# Patient Record
Sex: Female | Born: 1950 | Race: White | Hispanic: No | Smoking: Former smoker
Health system: Southern US, Community
[De-identification: ages and names within clinical notes are randomized; demographics above are authoritative.]

## PROBLEM LIST (undated history)

## (undated) DIAGNOSIS — T7840XA Allergy, unspecified, initial encounter: Secondary | ICD-10-CM

## (undated) DIAGNOSIS — I10 Essential (primary) hypertension: Secondary | ICD-10-CM

## (undated) DIAGNOSIS — Z8049 Family history of malignant neoplasm of other genital organs: Secondary | ICD-10-CM

## (undated) DIAGNOSIS — Z803 Family history of malignant neoplasm of breast: Secondary | ICD-10-CM

## (undated) HISTORY — DX: Allergy, unspecified, initial encounter: T78.40XA

## (undated) HISTORY — DX: Family history of malignant neoplasm of other genital organs: Z80.49

## (undated) HISTORY — DX: Gilbert syndrome: E80.4

## (undated) HISTORY — DX: Family history of malignant neoplasm of breast: Z80.3

## (undated) HISTORY — DX: Essential (primary) hypertension: I10

---
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
## 1998-08-29 ENCOUNTER — Ambulatory Visit (HOSPITAL_COMMUNITY): Admission: RE | Admit: 1998-08-29 | Discharge: 1998-08-29 | Payer: Self-pay | Admitting: Obstetrics and Gynecology

## 1998-11-30 ENCOUNTER — Other Ambulatory Visit: Admission: RE | Admit: 1998-11-30 | Discharge: 1998-11-30 | Payer: Self-pay | Admitting: Obstetrics and Gynecology

## 1999-09-18 ENCOUNTER — Encounter: Payer: Self-pay | Admitting: Obstetrics and Gynecology

## 1999-09-18 ENCOUNTER — Ambulatory Visit (HOSPITAL_COMMUNITY): Admission: RE | Admit: 1999-09-18 | Discharge: 1999-09-18 | Payer: Self-pay | Admitting: Obstetrics and Gynecology

## 2000-01-07 ENCOUNTER — Other Ambulatory Visit: Admission: RE | Admit: 2000-01-07 | Discharge: 2000-01-07 | Payer: Self-pay | Admitting: Obstetrics and Gynecology

## 2000-11-19 ENCOUNTER — Encounter: Payer: Self-pay | Admitting: Obstetrics and Gynecology

## 2000-11-19 ENCOUNTER — Ambulatory Visit (HOSPITAL_COMMUNITY): Admission: RE | Admit: 2000-11-19 | Discharge: 2000-11-19 | Payer: Self-pay | Admitting: Obstetrics and Gynecology

## 2001-01-07 ENCOUNTER — Other Ambulatory Visit: Admission: RE | Admit: 2001-01-07 | Discharge: 2001-01-07 | Payer: Self-pay | Admitting: Obstetrics and Gynecology

## 2002-07-05 ENCOUNTER — Encounter: Payer: Self-pay | Admitting: Obstetrics and Gynecology

## 2002-07-05 ENCOUNTER — Ambulatory Visit (HOSPITAL_COMMUNITY): Admission: RE | Admit: 2002-07-05 | Discharge: 2002-07-05 | Payer: Self-pay | Admitting: Obstetrics and Gynecology

## 2003-12-07 ENCOUNTER — Ambulatory Visit (HOSPITAL_COMMUNITY): Admission: RE | Admit: 2003-12-07 | Discharge: 2003-12-07 | Payer: Self-pay | Admitting: Obstetrics and Gynecology

## 2004-02-28 ENCOUNTER — Other Ambulatory Visit: Admission: RE | Admit: 2004-02-28 | Discharge: 2004-02-28 | Payer: Self-pay | Admitting: Obstetrics and Gynecology

## 2005-01-01 ENCOUNTER — Ambulatory Visit (HOSPITAL_COMMUNITY): Admission: RE | Admit: 2005-01-01 | Discharge: 2005-01-01 | Payer: Self-pay | Admitting: Obstetrics and Gynecology

## 2005-07-30 ENCOUNTER — Other Ambulatory Visit: Admission: RE | Admit: 2005-07-30 | Discharge: 2005-07-30 | Payer: Self-pay | Admitting: Obstetrics and Gynecology

## 2006-02-18 ENCOUNTER — Ambulatory Visit (HOSPITAL_COMMUNITY): Admission: RE | Admit: 2006-02-18 | Discharge: 2006-02-18 | Payer: Self-pay | Admitting: Obstetrics and Gynecology

## 2006-07-28 ENCOUNTER — Ambulatory Visit: Payer: Self-pay | Admitting: Internal Medicine

## 2006-08-04 ENCOUNTER — Ambulatory Visit: Payer: Self-pay | Admitting: Internal Medicine

## 2006-09-08 ENCOUNTER — Ambulatory Visit: Payer: Self-pay | Admitting: Internal Medicine

## 2006-09-24 ENCOUNTER — Ambulatory Visit: Payer: Self-pay | Admitting: Internal Medicine

## 2006-10-08 ENCOUNTER — Other Ambulatory Visit: Admission: RE | Admit: 2006-10-08 | Discharge: 2006-10-08 | Payer: Self-pay | Admitting: Obstetrics and Gynecology

## 2007-03-02 ENCOUNTER — Ambulatory Visit (HOSPITAL_COMMUNITY): Admission: RE | Admit: 2007-03-02 | Discharge: 2007-03-02 | Payer: Self-pay | Admitting: Obstetrics and Gynecology

## 2007-07-21 DIAGNOSIS — J309 Allergic rhinitis, unspecified: Secondary | ICD-10-CM | POA: Insufficient documentation

## 2007-07-21 DIAGNOSIS — M810 Age-related osteoporosis without current pathological fracture: Secondary | ICD-10-CM

## 2007-07-21 DIAGNOSIS — F329 Major depressive disorder, single episode, unspecified: Secondary | ICD-10-CM

## 2007-07-21 DIAGNOSIS — M818 Other osteoporosis without current pathological fracture: Secondary | ICD-10-CM | POA: Insufficient documentation

## 2008-01-09 LAB — CONVERTED CEMR LAB: Pap Smear: NORMAL

## 2008-01-17 ENCOUNTER — Other Ambulatory Visit: Admission: RE | Admit: 2008-01-17 | Discharge: 2008-01-17 | Payer: Self-pay | Admitting: Obstetrics and Gynecology

## 2008-03-20 ENCOUNTER — Ambulatory Visit (HOSPITAL_COMMUNITY): Admission: RE | Admit: 2008-03-20 | Discharge: 2008-03-20 | Payer: Self-pay | Admitting: Obstetrics and Gynecology

## 2008-09-24 LAB — HM COLONOSCOPY: HM Colonoscopy: NORMAL

## 2008-12-26 ENCOUNTER — Ambulatory Visit: Payer: Self-pay | Admitting: Internal Medicine

## 2008-12-26 LAB — CONVERTED CEMR LAB
AST: 22 units/L (ref 0–37)
Albumin: 4.5 g/dL (ref 3.5–5.2)
BUN: 15 mg/dL (ref 6–23)
Basophils Relative: 0.3 % (ref 0.0–3.0)
Bilirubin Urine: NEGATIVE
Blood in Urine, dipstick: NEGATIVE
Creatinine, Ser: 0.6 mg/dL (ref 0.4–1.2)
Direct LDL: 89.7 mg/dL
Eosinophils Absolute: 0.5 10*3/uL (ref 0.0–0.7)
Eosinophils Relative: 9.6 % — ABNORMAL HIGH (ref 0.0–5.0)
GFR calc Af Amer: 133 mL/min
GFR calc non Af Amer: 110 mL/min
Glucose, Bld: 92 mg/dL (ref 70–99)
Glucose, Urine, Semiquant: NEGATIVE
HCT: 40.3 % (ref 36.0–46.0)
Hemoglobin: 13.9 g/dL (ref 12.0–15.0)
MCV: 96.7 fL (ref 78.0–100.0)
Monocytes Absolute: 0.5 10*3/uL (ref 0.1–1.0)
Nitrite: NEGATIVE
Protein, U semiquant: NEGATIVE
RBC: 4.17 M/uL (ref 3.87–5.11)
Specific Gravity, Urine: 1.01
Total Protein: 7.3 g/dL (ref 6.0–8.3)
Urobilinogen, UA: 0.2
WBC Urine, dipstick: NEGATIVE
WBC: 5.7 10*3/uL (ref 4.5–10.5)

## 2009-01-02 ENCOUNTER — Ambulatory Visit: Payer: Self-pay | Admitting: Internal Medicine

## 2009-01-22 ENCOUNTER — Ambulatory Visit: Payer: Self-pay | Admitting: Internal Medicine

## 2009-02-06 ENCOUNTER — Ambulatory Visit: Payer: Self-pay | Admitting: Obstetrics and Gynecology

## 2009-03-21 ENCOUNTER — Other Ambulatory Visit: Admission: RE | Admit: 2009-03-21 | Discharge: 2009-03-21 | Payer: Self-pay | Admitting: Obstetrics and Gynecology

## 2009-03-21 ENCOUNTER — Encounter: Payer: Self-pay | Admitting: Obstetrics and Gynecology

## 2009-03-21 ENCOUNTER — Ambulatory Visit: Payer: Self-pay | Admitting: Obstetrics and Gynecology

## 2009-04-02 ENCOUNTER — Ambulatory Visit (HOSPITAL_COMMUNITY): Admission: RE | Admit: 2009-04-02 | Discharge: 2009-04-02 | Payer: Self-pay | Admitting: Obstetrics and Gynecology

## 2010-02-14 ENCOUNTER — Ambulatory Visit: Payer: Self-pay | Admitting: Family Medicine

## 2010-02-14 DIAGNOSIS — R42 Dizziness and giddiness: Secondary | ICD-10-CM | POA: Insufficient documentation

## 2010-03-10 LAB — CONVERTED CEMR LAB: Pap Smear: NORMAL

## 2010-03-26 ENCOUNTER — Ambulatory Visit: Payer: Self-pay | Admitting: Obstetrics and Gynecology

## 2010-03-26 ENCOUNTER — Other Ambulatory Visit: Admission: RE | Admit: 2010-03-26 | Discharge: 2010-03-26 | Payer: Self-pay | Admitting: Obstetrics and Gynecology

## 2010-04-09 ENCOUNTER — Ambulatory Visit (HOSPITAL_COMMUNITY): Admission: RE | Admit: 2010-04-09 | Discharge: 2010-04-09 | Payer: Self-pay | Admitting: Obstetrics and Gynecology

## 2010-07-18 ENCOUNTER — Ambulatory Visit: Payer: Self-pay | Admitting: Family Medicine

## 2010-12-10 NOTE — Letter (Signed)
Summary: Patient Questionnaire  Patient Questionnaire   Imported By: Beau Fanny 07/18/2010 15:05:51  _____________________________________________________________________  External Attachment:    Type:   Image     Comment:   External Document

## 2010-12-10 NOTE — Assessment & Plan Note (Signed)
Summary: NEW PT TO EST/TRANSFER FROM DR [facility]   Vital Signs:  Patient profile:   60 year old female Height:      64 inches Weight:      117.25 pounds BMI:     20.20 Temp:     98 degrees F oral Pulse rate:   64 / minute Pulse rhythm:   regular BP sitting:   136 / 92  (left arm) Cuff size:   regular  Vitals Entered By: Lewanda Rife LPN (July 18, 2010 11:09 AM) CC: New pt to establish   History of Present Illness: is here - switching practices from Dr Fabian Sharp   OP -- last dexa this [place]  some improvement in hip and spine is still OP  had gone off fosamax - for 1 year  alltogether has been on it 3 years  Dr Dianne Dun is monitoring that  is taking vitamin d every other day  women's multivit -- and gets 3-5 servings of high cal foods   past had brief episode of depression in 30 s- no longer a problem   reviewed health mt list   gets flu shots at the drug store   she is very nervous going to the doctor -- bp is up a bit  ok with the gyn         Allergies (verified): No Known Drug Allergies  Past History:  Family History: Last updated: 07/18/2010 Family History Breast cancer 1st degree relative <50 Family History of Cardiovascular disorder  age 40s  Family History Osteoporosis Father: polyarthritis nodousm,   obstruction  mass at cecum and PAN  died of pneumonia .   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   Allergic rhinitis Depression        LAST Mammogram: 03/10/2008 Pap: 01/09/2008 Td:01/09/2008 Colonscopy: 09/24/2006 EKG:07/27/06 Dexa: 01/09/2008 Eye Exam: 2008 Other: Smoking: former   Consults Dr. Collier Bullock Dr. Dianne Dun Dr. Lina Sar  Past Surgical History: Colonoscopy Tonsillectomy and adenoids 1957  Family History: Family History Breast cancer 1st degree relative <50 Family History of Cardiovascular disorder  age 41s  Family History Osteoporosis Father: polyarthritis nodousm,   obstruction  mass at cecum and PAN  died of pneumonia .   died in the past  year  Mother: breast cancer, heart attack age 80- died of that  MGF had MI and died at 38 also  father - polyarteritis nodosum - with big mass in intestines and then non healing wound , then died of pneumonia  Siblings: sister-hbp, osteopenia  Social History: Divorced Former Smoker  quit over 10 years ago  not employed currently  went to graduate school PHD critical theory  Alcohol use-yes Regular exercise-yes- running for exercise -- 4 days per week and walks the other 3 with  calesthenics     Review of Systems General:  Denies chills, fatigue, fever, loss of appetite, and malaise. Eyes:  Denies blurring and eye irritation. CV:  Denies chest pain or discomfort, lightheadness, palpitations, and shortness of breath with exertion. Resp:  Denies cough and shortness of breath. GI:  Denies abdominal pain, change in bowel habits, nausea, and vomiting. GU:  Denies dysuria and urinary frequency. MS:  Denies joint pain, joint redness, and joint swelling. Derm:  Denies itching, lesion(s), poor wound healing, and rash. Neuro:  Denies numbness, tingling, and weakness. Psych:  Denies anxiety and depression. Endo:  Denies cold intolerance, excessive thirst, excessive urination, and heat  intolerance. Heme:  Denies abnormal bruising and bleeding.  Physical Exam  General:  Well-developed,well-nourished,in no acute distress; alert,appropriate and cooperative throughout examination Head:  normocephalic, atraumatic, and no abnormalities observed.   Eyes:  vision grossly intact, pupils equal, pupils round, and pupils reactive to light.  no conjunctival pallor, injection or icterus  Ears:  R ear normal and L ear normal.   Nose:  no nasal discharge.   Mouth:  pharynx pink and moist.   Neck:  supple with full rom and no masses or thyromegally, no JVD or carotid bruit  Chest Wall:  No deformities, masses, or tenderness noted. Lungs:  Normal respiratory effort, chest expands symmetrically. Lungs are clear to auscultation, no crackles or wheezes. Heart:  Normal rate and regular rhythm. S1 and S2 normal without gallop, murmur, click, rub or other extra sounds. Abdomen:  soft, non-tender, and no distention.   Msk:  no kyphosis or acute joint changes  petite  Pulses:  R and L carotid,radial,femoral,dorsalis pedis and posterior tibial pulses are full and equal bilaterally Extremities:  No clubbing, cyanosis, edema, or deformity noted with normal full range of motion of all joints.   Neurologic:  sensation intact to light touch, gait normal, and DTRs symmetrical and normal.   Skin:  Intact without suspicious lesions or rashes Cervical Nodes:  No lymphadenopathy noted Psych:  normal affect, talkative and pleasant    Impression & Recommendations:  Problem # 1:  OSTEOPOROSIS (ICD-733.00) Assessment Unchanged monitored by gyn  no loss of ht or broken bones on 3 y of fosamax rev ca and D check D with next labs Her updated medication list for this problem includes:    Vitamin D 1000 Unit Tabs (Cholecalciferol) ..... One tablet every other day.(alternating with multivitamin)    Alendronate Sodium 70 Mg Tabs (Alendronate sodium) ..... One tablet by mouth weekly on same day. take on empty  stomach with 6-8 oz of water and stay up.  Problem # 2:  Preventive Health Care (ICD-V70.0) Assessment: Comment Only Td today  f/u feb for labs and PE  Complete Medication List: 1)  Vitamin D 1000 Unit Tabs (Cholecalciferol) .... One tablet every other day.(alternating with multivitamin) 2)  Multivitamins Tabs (Multiple vitamin) .... One tablet every other day alternating with vitamin d. 3)  Allegra-d 24 Hour 180-240 Mg Xr24h-tab (Fexofenadine-pseudoephedrine) .... Once daily as needed. 4)  Flonase 50 Mcg/act Susp (Fluticasone propionate) .... Uad 5)  Alendronate Sodium 70 Mg Tabs (Alendronate sodium) .... One tablet by mouth weekly on same day. take on empty stomach with 6-8 oz of water and stay up.  Other Orders: TD Toxoids IM 7 YR + (29528) Admin 1st Vaccine ([id number])  Patient Instructions: 1)  the current recommendation for calcium intake is 1200-1500 mg daily with 1000 IU of vitamin D  2)  schedule fasting labs after  feb 23 rd and then physical please 3)  keep up the good health habits 4)  tetnus shot today  Current Allergies (reviewed today): No known allergies    Preventive Care Screening  Bone Density:    Date:  03/10/2010    Results:  osteoporosis std dev  Mammogram:    Date:  03/10/2010    Results:  normal   Pap Smear:    Date:  03/10/2010    Results:  normal   Colonoscopy:    Date:  09/24/2008    Results:  normal    Immunizations Administered:  Tetanus Vaccine:    Vaccine Type: Td    Site: left deltoid    Mfr: Sanofi Pasteur    Dose: 0.5 ml    Route: IM    Given by: Lewanda Rife LPN    Exp. Date: 12/12/2011    Lot #: [id number]    VIS given: 09/27/08 version given July 18, 2010.

## 2010-12-10 NOTE — Assessment & Plan Note (Signed)
Summary: ?sinus inf/njr   Vital Signs:  Patient profile:   60 year old female Height:      64 inches Weight:      117 pounds BMI:     20.16 Temp:     98.3 degrees F oral BP sitting:   112 / 76  (left arm) Cuff size:   regular  Vitals Entered By: Kern Reap CMA Duncan Dull) (February 14, 2010 2:47 PM) CC: right ear pressure   CC:  right ear pressure.  History of Present Illness: Keyatta is a 60 year old of who comes in today for evaluation of two problems.  She has a history of underlying allergic rhinitis, for which she takes one third of the Allegra-D daily.  This is allergy season of course, in her allergies have flared out.  She is sneezing, running nose, watery eyes, head congestion.  No asthma.  Last night she woke up and had a bout of vertigo.  When she stood up her symptoms went away when she lies down.  She gets vertiginous.  She's had episodes of vertigo in the past, but nothing like this.  As noted above, is extremely positional.  No hearing loss, and neurologic review of systems negative  Allergies (verified): No Known Drug Allergies  Past History:  Past medical, surgical, family and social histories (including risk factors) reviewed for relevance to current acute and chronic problems.  Past Medical History: Reviewed history from 01/02/2009 and no changes required. Osteoporosis  bone density   last year.   Allergic rhinitis Depression Osteopenia       LAST Mammogram: 03/10/2008 Pap: 01/09/2008 Td:01/09/2008 Colonscopy: 09/24/2006 EKG:07/27/06 Dexa: 01/09/2008 Eye Exam: 2008 Other: Smoking: former   Consults Dr. Collier Bullock Dr. Dianne Dun Dr. Lina Sar  Past Surgical History: Reviewed history from 07/21/2007 and no changes required. Colonoscopy  Family History: Reviewed history from 01/02/2009 and no changes required. Family History Breast cancer 1st degree relative <50 Family History of Cardiovascular disorder  age 30s  Family History Osteoporosis Father:  polyarthritis nodousm,   obstruction  mass at cecum and PAN  died of pneumonia .   died in the past  year  Mother: breast cancer, heart attack age 31 Siblings: sister-hbp, osteopenia  Social History: Reviewed history from 01/02/2009 and no changes required. Divorced Former Smoker Alcohol use-yes Regular exercise-yes    Review of Systems      See HPI  Physical Exam  General:  Well-developed,well-nourished,in no acute distress; alert,appropriate and cooperative throughout examination Head:  Normocephalic and atraumatic without obvious abnormalities. No apparent alopecia or balding. Eyes:  No corneal or conjunctival inflammation noted. EOMI. Perrla. Funduscopic exam benign, without hemorrhages, exudates or papilledema. Vision grossly normal. Ears:  External ear exam shows no significant lesions or deformities.  Otoscopic examination reveals clear canals, tympanic membranes are intact bilaterally without bulging, retraction, inflammation or discharge. Hearing is grossly normal bilaterally. Nose:  External nasal examination shows no deformity or inflammation. Nasal mucosa are pink and moist without lesions or exudates. Mouth:  Oral mucosa and oropharynx without lesions or exudates.  Teeth in good repair. Neck:  No deformities, masses, or tenderness noted. Neurologic:  No cranial nerve deficits noted. Station and gait are normal. Plantar reflexes are down-going bilaterally. DTRs are symmetrical throughout. Sensory, motor and coordinative functions appear intact.   Problems:  Medical Problems Added: 1)  Dx of Vertigo  (ICD-780.4)  Impression & Recommendations:  Problem # 1:  VERTIGO (ICD-780.4) Assessment New  Problem # 2:  ALLERGIC RHINITIS (ICD-477.9) Assessment:  Deteriorated  Her updated medication list for this problem includes:    Flonase 50 Mcg/act Susp (Fluticasone propionate) ..... Uad  Complete Medication List: 1)  Vitamin D 1000 Unit Tabs (Cholecalciferol) 2)   Multivitamins Tabs (Multiple vitamin) 3)  Allegra-d 24 Hour 180-240 Mg Xr24h-tab (Fexofenadine-pseudoephedrine) .... Once daily 4)  Flonase 50 Mcg/act Susp (Fluticasone propionate) .... Uad  Patient Instructions: 1)  consider using the saline nasal irrigation at bedtime, and one shot of steroid nasal spray up each nostril at bedtime Prescriptions: FLONASE 50 MCG/ACT SUSP (FLUTICASONE PROPIONATE) UAD  #2 x 6   Entered and Authorized by:   Roderick Pee MD   Signed by:   Roderick Pee MD on 02/14/2010   Method used:   Electronically to        [facility]* (retail)       [address]       [place], [place]  [address]       Ph: [phone]       Fax: [phone]   RxID:   [id number]

## 2011-01-03 ENCOUNTER — Encounter (INDEPENDENT_AMBULATORY_CARE_PROVIDER_SITE_OTHER): Payer: Self-pay | Admitting: *Deleted

## 2011-01-03 ENCOUNTER — Other Ambulatory Visit: Payer: Self-pay | Admitting: Family Medicine

## 2011-01-03 ENCOUNTER — Encounter: Payer: Self-pay | Admitting: Family Medicine

## 2011-01-03 ENCOUNTER — Other Ambulatory Visit (INDEPENDENT_AMBULATORY_CARE_PROVIDER_SITE_OTHER): Payer: BC Managed Care – PPO

## 2011-01-03 ENCOUNTER — Telehealth (INDEPENDENT_AMBULATORY_CARE_PROVIDER_SITE_OTHER): Payer: Self-pay | Admitting: *Deleted

## 2011-01-03 DIAGNOSIS — M810 Age-related osteoporosis without current pathological fracture: Secondary | ICD-10-CM

## 2011-01-03 DIAGNOSIS — Z0000 Encounter for general adult medical examination without abnormal findings: Secondary | ICD-10-CM

## 2011-01-03 DIAGNOSIS — M899 Disorder of bone, unspecified: Secondary | ICD-10-CM

## 2011-01-03 LAB — BASIC METABOLIC PANEL
Calcium: 9.7 mg/dL (ref 8.4–10.5)
GFR: 95.61 mL/min (ref >60.00)
Glucose, Bld: 97 mg/dL (ref 70–99)
Potassium: 4.6 mEq/L (ref 3.5–5.1)
Sodium: 140 mEq/L (ref 135–145)

## 2011-01-03 LAB — CBC WITH DIFFERENTIAL/PLATELET
Basophils Relative: 0.7 % (ref 0.0–3.0)
Eosinophils Relative: 8.8 % — ABNORMAL HIGH (ref 0.0–5.0)
HCT: 39.7 % (ref 36.0–46.0)
Hemoglobin: 13.8 g/dL (ref 12.0–15.0)
Lymphocytes Relative: 34.4 % (ref 12.0–46.0)
Lymphs Abs: 2 10*3/uL (ref 0.7–4.0)
Monocytes Relative: 8.9 % (ref 3.0–12.0)
Neutro Abs: 2.8 10*3/uL (ref 1.4–7.7)
RBC: 4.15 Mil/uL (ref 3.87–5.11)
RDW: 13.3 % (ref 11.5–14.6)
WBC: 5.9 10*3/uL (ref 4.5–10.5)

## 2011-01-03 LAB — LIPID PANEL
Total CHOL/HDL Ratio: 2
VLDL: 6.8 mg/dL (ref 0.0–40.0)

## 2011-01-03 LAB — HEPATIC FUNCTION PANEL
ALT: 14 U/L (ref 0–35)
AST: 22 U/L (ref 0–37)
Albumin: 4.6 g/dL (ref 3.5–5.2)
Alkaline Phosphatase: 53 U/L (ref 39–117)
Total Protein: 6.8 g/dL (ref 6.0–8.3)

## 2011-01-03 LAB — TSH: TSH: 2.36 u[IU]/mL (ref 0.35–5.50)

## 2011-01-07 ENCOUNTER — Encounter (INDEPENDENT_AMBULATORY_CARE_PROVIDER_SITE_OTHER): Payer: BC Managed Care – PPO | Admitting: Family Medicine

## 2011-01-07 ENCOUNTER — Encounter: Payer: Self-pay | Admitting: Family Medicine

## 2011-01-07 DIAGNOSIS — Z0000 Encounter for general adult medical examination without abnormal findings: Secondary | ICD-10-CM

## 2011-01-07 DIAGNOSIS — M810 Age-related osteoporosis without current pathological fracture: Secondary | ICD-10-CM

## 2011-01-07 DIAGNOSIS — R030 Elevated blood-pressure reading, without diagnosis of hypertension: Secondary | ICD-10-CM | POA: Insufficient documentation

## 2011-01-07 NOTE — Progress Notes (Signed)
----   Converted from flag ---- ---- 01/02/2011 8:04 PM, Judith Part MD wrote: please check lipid/wellness/ vit D for v70.0 and 733.0  ---- 12/31/2010 11:54 AM, Liane Comber CMA (AAMA) wrote: Lab orders please! Good Morning! This pt is scheduled for cpx labs Friday, which labs to draw and dx codes to use? Thanks Tasha ------------------------------

## 2011-01-09 ENCOUNTER — Encounter: Payer: Self-pay | Admitting: Family Medicine

## 2011-01-16 NOTE — Assessment & Plan Note (Signed)
Summary: cpe   Vital Signs:  Patient profile:   60 year old female Height:      63.5 inches Weight:      115.75 pounds BMI:     20.26 Temp:     98 degrees F oral Pulse rate:   68 / minute Pulse rhythm:   regular BP sitting:   142 / 96  (left arm) Cuff size:   regular  Vitals Entered By: Lewanda Rife LPN (January 07, 2011 12:05 PM)  Serial Vital Signs/Assessments:  Time      Position  BP       Pulse  Resp  Temp     By                     148/90                         Judith Part MD  CC: CPX GYN does pap and breast exam   History of Present Illness: here for wellness exam and to disc chronic medical problems   is feeling good overall no complaints   wt is down 2 lb with bmi 20  142/96 bp today - this is high for her  bp is good at gyn gets more nervous here  some family hx  just got a bp cuff for home   was running for exercise  walks 3.5 miles per day and sit ups and push ups   pap and mam 5/11 with her gyn no breast lumps on self exam  dexa - OP in 5/11 too on fosamax ca and vit D D level is 37  TD 9/11-- made her really tired  other imms got a flu shot -- october   lipids good Last Lipid ProfileCholesterol: 191 (01/03/2011 8:41:06 AM)HDL:  104.70 (01/03/2011 8:41:06 AM)LDL:  80 (01/03/2011 8:41:06 AM)Triglycerides:  Last Liver profileSGOT:  22 (01/03/2011 8:41:06 AM)SPGT:  14 (01/03/2011 8:41:06 AM)T. 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   died in the past  year  Mother: breast cancer, heart attack age 80- died of that  MGF had MI and died at 1 also  father - polyarteritis nodosum - with big mass in intestines and then non healing wound , then died of pneumonia  Siblings: sister-hbp, osteopenia  Social History: Last updated: 07/18/2010 Divorced Former Smoker  quit over 10 years ago  not employed currently  went to graduate school PHD critical theory  Alcohol use-yes Regular exercise-yes- running for exercise -- 4 days per week and walks the other 3 with calesthenics     Risk Factors: Alcohol Use: <1 (01/02/2009) Exercise: yes (07/21/2007)  Risk Factors: Smoking Status: quit (07/21/2007)  Past Medical History: Osteoporosis  bone density   last year.   Allergic rhinitis Depression Gilbert's syndrome with high bilirubin  former smoker   Consults Dr. Collier Bullock Dr. Dianne Dun Dr. Lina Sar derm-  Review of Systems General:  Denies fatigue, loss of appetite, and malaise. Eyes:  Denies blurring and eye irritation. CV:  Denies chest pain or discomfort, lightheadness, palpitations, and shortness of breath with exertion. Resp:  Denies cough, shortness of breath, and wheezing. GI:  Denies abdominal pain, change in bowel habits, indigestion, and nausea. GU:  Denies abnormal vaginal bleeding, discharge, and urinary frequency. MS:  Denies joint pain, joint redness, joint swelling, muscle aches, and cramps. Derm:  Denies itching, lesion(s), poor wound healing, and rash. Neuro:  Denies numbness and tingling. Psych:  mood is ok . Endo:  Denies cold intolerance, excessive thirst, excessive urination, and heat intolerance. Heme:  Denies abnormal bruising and bleeding.  Physical Exam  General:  Well-developed,well-nourished,in no acute distress; alert,appropriate and cooperative throughout examination Head:  normocephalic, atraumatic, and no abnormalities  observed.   Eyes:  vision grossly intact, pupils equal, pupils round, and pupils reactive to light.  no conjunctival pallor, injection or icterus  Ears:  R ear normal and L ear normal.   Nose:  no nasal discharge.   Mouth:  pharynx pink and moist.   Neck:  supple with full rom and no masses or thyromegally, no JVD or carotid bruit  Chest Wall:  No deformities, masses, or tenderness noted. Lungs:  Normal respiratory effort, chest expands symmetrically. Lungs are clear to auscultation, no crackles or wheezes. Heart:  Normal rate and regular rhythm. S1 and S2 normal without gallop, murmur, click, rub or other extra sounds. Abdomen:  Bowel sounds positive,abdomen soft and non-tender without masses, organomegaly or hernias noted. no renal bruits  Msk:  no kyphosis or acute joint changes  petite  no acute joint changes  Pulses:  R and L carotid,radial,femoral,dorsalis pedis and posterior tibial pulses are full and equal bilaterally Extremities:  No clubbing, cyanosis, edema, or deformity noted with normal full range of motion of all joints.   Neurologic:  sensation intact to light touch, gait normal, and DTRs symmetrical and normal.   Skin:  Intact without suspicious lesions or rashes lentigos on back Cervical Nodes:  No lymphadenopathy noted Inguinal Nodes:  No significant adenopathy Psych:  normal affect, talkative and pleasant    Impression & Recommendations:  Problem # 1:  HEALTH MAINTENANCE EXAM, ADULT (ICD-V70.0) Assessment Comment Only reviewed health habits including diet, exercise and skin cancer prevention reviewed health maintenance list and family history  reviewed wellness labs in detail great cholesterol   Problem # 2:  OSTEOPOROSIS (ICD-733.00) Assessment: Unchanged currently on fosamax and followed by gyn  will be due dexa in 2013 continue ca and D rev labs with D level in nl range  Her updated medication list for this problem includes:    Vitamin D 1000 Unit Tabs  (Cholecalciferol) ..... One tablet every other day.(alternating with multivitamin)    Alendronate Sodium 70 Mg Tabs (Alendronate sodium) ..... One tablet by mouth weekly on same day. take on empty stomach with 6-8 oz of water and stay up.  Problem # 3:  ELEVATED BLOOD PRESSURE WITHOUT DIAGNOSIS OF HYPERTENSION (ICD-796.2) Assessment: New elevated again today ? real or white coat   (is much lower in gyn office) given handout from [facility] to read on HTN  health habits are good  will get cuff for home and start checking it there  f/u 2-3 wk for nurse visit for this and bring cuff if still elevated will initiate work up and tx   Complete Medication List: 1)  Vitamin D 1000 Unit Tabs (Cholecalciferol) .Marland KitchenMarland KitchenMarland Kitchen  One tablet every other day.(alternating with multivitamin) 2)  Multivitamins Tabs (Multiple vitamin) .... One tablet every other day alternating with vitamin d. 3)  Allegra-d 24 Hour 180-240 Mg Xr24h-tab (Fexofenadine-pseudoephedrine) .... Once daily as needed. 4)  Alendronate Sodium 70 Mg Tabs (Alendronate sodium) .... One tablet by mouth weekly on same day. take on empty stomach with 6-8 oz of water and stay up. 5)  Omega 3 Swirl  .... One tsp every other day. 6)  Flax Oil (Flaxseed (linseed)) .... One tsp every other day  Patient Instructions: 1)  check blood pressure at home when relaxed 3-4 times per week  2)  I like [employer] brand of cuff for the arm size regular 3)  schedule nurse visit in 2-3 weeks for bp check - bring your cuff  4)  keep up the great exercise  5)  labs look good    Orders Added: 1)  Est. Patient 40-64 years [[id number]]   Immunization History:  Influenza Immunization History:    Influenza:  fluvax 3+ (08/14/2010)   Immunization History:  Influenza Immunization History:    Influenza:  Fluvax 3+ (08/14/2010)  Current Allergies (reviewed today): * TETNUS SHOT

## 2011-01-21 ENCOUNTER — Ambulatory Visit: Payer: BC Managed Care – PPO

## 2011-01-27 ENCOUNTER — Telehealth (INDEPENDENT_AMBULATORY_CARE_PROVIDER_SITE_OTHER): Payer: Self-pay | Admitting: *Deleted

## 2011-01-30 ENCOUNTER — Ambulatory Visit: Payer: BC Managed Care – PPO

## 2011-02-06 ENCOUNTER — Telehealth: Payer: Self-pay | Admitting: *Deleted

## 2011-02-06 NOTE — Telephone Encounter (Signed)
Left message for pt to call back  °

## 2011-02-06 NOTE — Telephone Encounter (Signed)
Pt called to report her BP readings.  145/90, 148/94, 150/90.  Advised pt that you are out of the office until Monday.

## 2011-02-06 NOTE — Telephone Encounter (Signed)
Patient notified as instructed by telephone. Pt wondered if could be seen by Dr Milinda Antis before 03/03/11. I explained with new computer system is possible appts might open up and if not could be worked in with another dr if pt is that concerned about BP. Pt said she would call back next week and see if any available sooner appts with Dr Milinda Antis.

## 2011-02-06 NOTE — Telephone Encounter (Signed)
Those are a bit high - will have to disc work up and tx strategies at f/u in April  Keep up healthy habits and watch sodium in diet

## 2011-02-06 NOTE — Progress Notes (Signed)
Summary: BLOOD PRESSURE CHECK   ---- Converted from flag ---- ---- 01/26/2011 7:29 PM, Judith Part MD wrote: that is ok with me   ---- 01/23/2011 11:55 AM, Daine Gip wrote: Pt was scheduled for a Blood Pressure check on March 22,2012. She will check her blood pressure and call the readings into the trige nurse. Dr. Milinda Antis would that be okay, instead of an nurse visit. Pt was scheduled on the 1st week of go live...cdavis ------------------------------

## 2011-02-22 ENCOUNTER — Encounter: Payer: Self-pay | Admitting: Family Medicine

## 2011-03-03 ENCOUNTER — Encounter: Payer: Self-pay | Admitting: Family Medicine

## 2011-03-03 ENCOUNTER — Ambulatory Visit (INDEPENDENT_AMBULATORY_CARE_PROVIDER_SITE_OTHER): Payer: BC Managed Care – PPO | Admitting: Family Medicine

## 2011-03-03 DIAGNOSIS — M810 Age-related osteoporosis without current pathological fracture: Secondary | ICD-10-CM

## 2011-03-03 DIAGNOSIS — Z0000 Encounter for general adult medical examination without abnormal findings: Secondary | ICD-10-CM

## 2011-03-03 DIAGNOSIS — I10 Essential (primary) hypertension: Secondary | ICD-10-CM

## 2011-03-03 MED ORDER — LISINOPRIL 10 MG PO TABS: 10.0000 mg | ORAL_TABLET | Freq: Every day | ORAL | Status: DC

## 2011-03-03 NOTE — Patient Instructions (Signed)
Start lisinopril for blood pressure If any side effects like cough- update me Keep track of your blood pressure at home Follow up with me in about 6 weeks Keep up the healthy habits

## 2011-03-03 NOTE — Assessment & Plan Note (Signed)
Followed by gyn  Good habits  Ca and d and exercise reviewed

## 2011-03-03 NOTE — Assessment & Plan Note (Signed)
Gradual worsening with strong fam hx  Will try lisinopril 10 mg daily Disc poss side eff Call if problems Keep up good habits Last labs rev F/u 6 wk

## 2011-03-03 NOTE — Progress Notes (Signed)
Subjective:    Patient ID: Karen Rice, female    DOB: Jan 02, 1951, 60 y.o.   MRN: [id number]  HPI Here for health mt exam and to disc chronic med problems She is feeling great overall  Is worried about her blood pressure -- sister and parents had it   No cp or sob or swelling of the legs  Laurie want to have cardiology consult in the future    bp is up today 150/96- higher than last time Exercises and no salt and keeps weight down  Gets accupuncture - this helps relax her but does not bring bp down that much   On fosamax for OP-- she thinks this is the 4-5th year  Will disc with Dr Dianne Dun  Ca and D- is taking  Last dexa-- was 1 year ago  Is post menopausal  No broken bones   Great labs in feb - great Lab Results  Component Value Date   CHOL 191 01/03/2011   CHOL 210* 12/26/2008   Lab Results  Component Value Date   HDL 104.70 01/03/2011   HDL 97.1 12/26/2008   Lab Results  Component Value Date   LDLCALC 80 01/03/2011   Lab Results  Component Value Date   TRIG 34.0 01/03/2011   TRIG 49 12/26/2008   Lab Results  Component Value Date   CHOLHDL 2 01/03/2011   CHOLHDL 2.2 CALC 12/26/2008      Wt is stable-- good  Pap last year- normal  No gyn problems or issues   Mam last Reinig was normal - she will set up her own with gyn  colonosc 11.09 Father had mass that was cancer  Is not due uneil 11/19  Td 9/11  Sees Dr Dianne Dun in [place] or June  Last pap was normal   Past Medical History  Diagnosis Date  . Osteoporosis   . Allergy     allergic rhinitis  . Depression   . Gilbert's syndrome     with high bilirubin   Past Surgical History  Procedure Date  . Tonsillectomy and adenoidectomy 1957    reports that she quit smoking about 10 years ago. She does not have any smokeless tobacco history on file. She reports that she drinks alcohol. Her drug history not on file. family history includes Cancer in her father and mother; Heart disease in her maternal grandfather  and mother; Hypertension in her sister; and Osteopenia in her sister. Allergies  Allergen Reactions  . Tetanus Toxoid     REACTION: very fatigued for 1 week after      Review of Systems  Genitourinary: Negative for vaginal bleeding, vaginal discharge and pelvic pain.   Review of Systems  Constitutional: Negative for fever, appetite change, fatigue and unexpected weight change.  Eyes: Negative for pain and visual disturbance.  Respiratory: Negative for cough and shortness of breath.   Cardiovascular: Negative.   Gastrointestinal: Negative for nausea, diarrhea and constipation.  Genitourinary: Negative for urgency and frequency.  Skin: Negative for pallor.  Neurological: Negative for weakness, light-headedness, numbness and headaches.  Hematological: Negative for adenopathy. Does not bruise/bleed easily.  Psychiatric/Behavioral: Negative for dysphoric mood. The patient is not nervous/anxious.         Objective:   Physical Exam  Constitutional: She appears well-developed and well-nourished. No distress.  HENT:  Head: Normocephalic and atraumatic.  Right Ear: External ear normal.  Left Ear: External ear normal.  Nose: Nose normal.  Mouth/Throat: Oropharynx is clear and moist.  Eyes: Conjunctivae  and EOM are normal. Pupils are equal, round, and reactive to light.  Neck: Normal range of motion. Neck supple. No JVD present. Carotid bruit is not present. No thyromegaly present.  Cardiovascular: Normal rate, regular rhythm and normal heart sounds.  Exam reveals no gallop.   No murmur heard. Pulmonary/Chest: Effort normal and breath sounds normal. She has no wheezes.  Abdominal: Soft. Bowel sounds are normal. She exhibits no distension, no abdominal bruit and no mass. There is no tenderness.  Musculoskeletal: Normal range of motion. She exhibits no edema and no tenderness.  Lymphadenopathy:    She has no cervical adenopathy.  Neurological: She is alert. She has normal reflexes.  Coordination normal.  Skin: Skin is warm and dry. No rash noted. No erythema. No pallor.       Some lentigos  Psychiatric: She has a normal mood and affect.          Assessment & Plan:

## 2011-03-03 NOTE — Assessment & Plan Note (Signed)
Labs reviewed from feb Reviewed health habits including diet and exercise and skin cancer prevention Also reviewed health mt list, fam hx and immunizations

## 2011-03-28 NOTE — Assessment & Plan Note (Signed)
[facility]                              [facility] OFFICE NOTE   NAME:[name], [name]                            MRN:          [id number]  DATE:07/28/2006                            DOB:          03/25/1951    CHIEF COMPLAINT:  New patient check up.   HISTORY OF PRESENT ILLNESS:  Karen [name] is a 60 year old, very remote smoking,  married white female comes in today for her first time visit.  Her ob-gyn is  Dr. Eda Paschal.  Her previous primary care was Heather Roberts, M.D.  She is  generally well but has either osteoporosis or osteopenia, is on Fosamax with  D as per gyn.  She is schedule to have another DEXA scan done in the near  future but does not have a copy of that today.  She also has a family  history of heart disease.  Her mother died at age 107 of a massive myocardial  infarction as well as her maternal grandfather, I believe.  Her mother also  had a history of breast cancer so she has questions about cardiovascular  risks.  But generally she is well today.   PAST MEDICAL HISTORY:  See data base.  History of reactive depression age  61, no problems now.  Seasonal rhinitis.  Osteoporosis versus osteopenia by  DEXA with a history of a stress fracture in her foot.   PAST SURGICAL HISTORY:  None.   HOSPITALIZATION:  For child birth, 89, 80 and 42.   Her last Pap was October 2006, gravida 3, para 3, last mammogram April of  2007.  Tetanus shot 2001.  Has not had a colonoscopy.   FAMILY HISTORY:  As above.  Mother died of heart attack at age 41, positive  breast cancer, positive osteoporosis.  Negative for thyroid disease or  diabetes.   REVIEW OF SYSTEMS:  Negative for chest pain, shortness of breath.  She  exercises regular.  Has a history of Gilbert's but no symptoms related to  that.  GI/GU:  Noncontributory.   SOCIAL HISTORY:  Household of 2, 8 hours of sleep.  Social alcohol, no  current tobacco.  Remote history of insignificant use in  the past.  See data  base otherwise.   MEDICATIONS:  Fosamax with D.  Calcium supplementation.   ALLERGIES:  NONE RECORDED.   OBJECTIVE:  Height 64 inches, weight 118, temperature 97.5, blood pressure  132/98, pulse 62 and regular.  This is a well-developed, well-nourished  healthy appearing lady in no acute distress.  HEENT:  Normocephalic.  Tympanic membranes clear.  Eyes:  Pupils equal,  round and reactive to light and accommodation.  Sclerae nonicteric.  OP  clear.  Teeth in adequate repair.  NECK:  Without mass, thyromegaly or bruits.  CHEST:  CTAB, equal.  CARDIAC:  S1, S2, no gallops or murmurs.  Peripheral pulses present without  delay.  Negative CCE.  BREASTS:  No nodules or discharge.  Axilla clear.  ABDOMEN:  Soft, without megaly, guarding or rebound.  EXTREMITIES:  No obvious focal atrophy.  NEUROLOGIC:  Grossly intact.  SKIN:  No acute changes or jaundice noted.   EKG shows normal sinus rhythm except for the rate of 57.   IMPRESSION:  1. Wellness exam, new patient primary care.  Appears to be up-to-date on      health care maintenance except for screening colonoscopy for colon      cancer.  Will make referral to Dr. Juanda Chance for this.  2. Bone health.  Osteopenia versus osteoporosis.  Will check CPX labs but      also have PTH, vitamin D levels at present and after we get a copy of      her bone density after it is available also.  3. Family history of heart disease, sudden death in later years.  Will do      risk evaluation after lab work is back and follow up as appropriate.      Did review healthy lifestyle also and will review old records.                                   Neta Mends. Fabian Sharp, MD   WKP/MedQ  DD:  07/28/2006  DT:  07/29/2006  Job #:  [id number]

## 2011-03-31 ENCOUNTER — Other Ambulatory Visit: Payer: Self-pay | Admitting: Family Medicine

## 2011-03-31 DIAGNOSIS — Z1231 Encounter for screening mammogram for malignant neoplasm of breast: Secondary | ICD-10-CM

## 2011-04-08 ENCOUNTER — Encounter: Payer: BC Managed Care – PPO | Admitting: Obstetrics and Gynecology

## 2011-04-08 ENCOUNTER — Telehealth: Payer: Self-pay | Admitting: *Deleted

## 2011-04-08 NOTE — Telephone Encounter (Signed)
Go off of it for 2-3 days and then call back and let me know if fatigue is better -- that is the only way I will know for sure if that is what is causing her fatigue-- then we will make a plan Thanks for the update

## 2011-04-08 NOTE — Telephone Encounter (Signed)
Patient says that since taking the lisinopril 10 mg she has been feeling wiped out. Today her blood pressure is 120/80 so she is very happy with that, but she is just very tired. She says that she is not going to take the lisinopril today. She is asking if the dose should be decreased or if she should try a different medication. Uses [facility].

## 2011-04-08 NOTE — Telephone Encounter (Signed)
Patient notified as instructed by telephone. Pt said she feels better and she has appt already scheduled with Dr Milinda Antis on Mon. Pt will call back sooner if problem.

## 2011-04-10 ENCOUNTER — Encounter (INDEPENDENT_AMBULATORY_CARE_PROVIDER_SITE_OTHER): Payer: BC Managed Care – PPO | Admitting: Obstetrics and Gynecology

## 2011-04-10 ENCOUNTER — Other Ambulatory Visit: Payer: Self-pay | Admitting: Obstetrics and Gynecology

## 2011-04-10 ENCOUNTER — Other Ambulatory Visit (HOSPITAL_COMMUNITY)
Admission: RE | Admit: 2011-04-10 | Discharge: 2011-04-10 | Disposition: A | Discharge status: Home / Self Care | Payer: BC Managed Care – PPO | Source: Ambulatory Visit | Attending: Obstetrics and Gynecology | Admitting: Obstetrics and Gynecology

## 2011-04-10 DIAGNOSIS — Z01419 Encounter for gynecological examination (general) (routine) without abnormal findings: Secondary | ICD-10-CM

## 2011-04-10 DIAGNOSIS — Z124 Encounter for screening for malignant neoplasm of cervix: Secondary | ICD-10-CM | POA: Insufficient documentation

## 2011-04-14 ENCOUNTER — Encounter: Payer: Self-pay | Admitting: Family Medicine

## 2011-04-14 ENCOUNTER — Ambulatory Visit (INDEPENDENT_AMBULATORY_CARE_PROVIDER_SITE_OTHER): Payer: BC Managed Care – PPO | Admitting: Family Medicine

## 2011-04-14 DIAGNOSIS — F411 Generalized anxiety disorder: Secondary | ICD-10-CM

## 2011-04-14 DIAGNOSIS — F419 Anxiety disorder, unspecified: Secondary | ICD-10-CM | POA: Insufficient documentation

## 2011-04-14 DIAGNOSIS — I10 Essential (primary) hypertension: Secondary | ICD-10-CM

## 2011-04-14 MED ORDER — AMLODIPINE BESYLATE 5 MG PO TABS: 5.0000 mg | ORAL_TABLET | Freq: Every day | ORAL | Status: DC

## 2011-04-14 MED ORDER — BUSPIRONE HCL 15 MG PO TABS: 7.5000 mg | ORAL_TABLET | Freq: Two times a day (BID) | ORAL | Status: DC

## 2011-04-14 NOTE — Progress Notes (Signed)
Subjective:    Patient ID: Karen Rice, female    DOB: 1951-11-08, 60 y.o.   MRN: [id number]  HPI Here for HTN  Tried lisinopril but it made her severely fatigued -- so she stopped it  Is really worked up about it   Saw her gyn-- Dr Karen Agar-- after stopping it   Was very anxious about how med made her feel  Not sleeping  Some stress going on Not exercising retularly  bp still high No cp or ha or edema or palpitatoins  Patient Active Problem List  Diagnoses  . DEPRESSION  . ALLERGIC RHINITIS  . OSTEOPOROSIS  . VERTIGO  . Hypertension  . Routine general medical examination at a health care facility  . Anxiety   Past Medical History  Diagnosis Date  . Osteoporosis   . Allergy     allergic rhinitis  . Depression   . Gilbert's syndrome     with high bilirubin  . Hypertension   . Osteoporosis    Past Surgical History  Procedure Date  . Tonsillectomy and adenoidectomy 1957   History  Substance Use Topics  . Smoking status: Former Smoker    Quit date: 11/10/2000  . Smokeless tobacco: Not on file  . Alcohol Use: 0.0 oz/week     2 GLASSES OF WINE DAILY   Family History  Problem Relation Age of Onset  . Cancer Mother     breast CA  . Heart disease Mother     heart attack  . Hypertension Mother   . Cancer Father     obstruction mass at cecum  . Hypertension Father   . Hypertension Sister   . Osteopenia Sister   . Heart disease Maternal Grandfather     MI   Allergies  Allergen Reactions  . Lisinopril Other (See Comments)    Severe fatigue  . Tetanus Toxoid     REACTION: very fatigued for 1 week after   Current Outpatient Prescriptions on File Prior to Visit  Medication Sig Dispense Refill  . cholecalciferol (VITAMIN D) 1000 UNITS tablet 1 tablet by mouth every other day alternating with multivitamin.       . Flax OIL 1 teaspoon every other day.a       . Multiple Vitamin (MULTIVITAMIN) capsule One tablet every other day alternating with Vitamin D.       .  Omega-3 Fatty Acids LIQD 1 teaspoon every other day.       Marland Kitchen alendronate (FOSAMAX) 70 MG tablet Take 70 mg by mouth every 7 (seven) days. Take with a full glass of water on an empty stomach and stay up.       . fexofenadine-pseudoephedrine (ALLEGRA-D 24) 180-240 MG per 24 hr tablet Once daily as needed.            Review of Systems Review of Systems  Constitutional: Negative for fever, appetite change,  and unexpected weight change. pos for fatigue Eyes: Negative for pain and visual disturbance.  Respiratory: Negative for cough and shortness of breath.   Cardiovascular: Negative.for cp or palp    Gastrointestinal: Negative for nausea, diarrhea and constipation.  Genitourinary: Negative for urgency and frequency.  Skin: Negative for pallor.  Neurological: Negative for weakness, light-headedness, numbness and headaches.  Hematological: Negative for adenopathy. Does not bruise/bleed easily.  Psychiatric/Behavioral: Negative for dysphoric mood. Pos for anx and trouble sleeping          Objective:   Physical Exam  Constitutional: She appears well-developed and well-nourished.  No distress.  HENT:  Head: Atraumatic.  Mouth/Throat: Oropharynx is clear and moist.  Eyes: Conjunctivae and EOM are normal. Pupils are equal, round, and reactive to light.  Neck: Normal range of motion. Neck supple. No JVD present. Carotid bruit is not present. No thyromegaly present.  Cardiovascular: Normal rate, regular rhythm, normal heart sounds and intact distal pulses.   Pulmonary/Chest: Effort normal and breath sounds normal. No respiratory distress. She has no wheezes.  Abdominal: Soft. Bowel sounds are normal. She exhibits no distension and no mass. There is no tenderness.  Musculoskeletal: She exhibits no edema and no tenderness.  Lymphadenopathy:    She has no cervical adenopathy.  Neurological: She is alert. She has normal reflexes. Coordination normal.  Skin: Skin is warm and dry. No rash noted.  No erythema. No pallor.  Psychiatric: She has a normal mood and affect.       Not tearful Good eye contact and comm skills           Assessment & Plan:

## 2011-04-14 NOTE — Assessment & Plan Note (Signed)
Intol of lisinopril  Will try norvasc 5 mg F/u 6 weeks  Good health habits  Update asap if side eff or problems

## 2011-04-14 NOTE — Assessment & Plan Note (Signed)
With focus on health Working with counselor Disc coping tech in detail  >25 min spent with face to face with patient counseling and coordinating care  written px for buspar- [place] try - is considering F/u 6 wk Disc poss side eff- if worse anx or dep stop med and call

## 2011-04-14 NOTE — Patient Instructions (Signed)
Stop lisinopril (you already did)  Start norvasc 5 mg once daily  If any side effects or problems- let me know For anxiety- if you feel you need it -- try buspar twice daily (if you feel worse on this or any side effects call and stop it)  Continue counseling  Follow up with me in 6 weeks

## 2011-05-06 ENCOUNTER — Telehealth: Payer: Self-pay | Admitting: *Deleted

## 2011-05-06 NOTE — Telephone Encounter (Signed)
Patient is asking if it would be okay for her to taking only half of the amlodipine. She is concerned that she is going to have the same reaction with it as the lisinopril. She is aware that you are out of the office until next week.

## 2011-05-06 NOTE — Telephone Encounter (Signed)
Left message for pt to call back  °

## 2011-05-06 NOTE — Telephone Encounter (Signed)
Patient notified. She will call if she is having any side effects.

## 2011-05-06 NOTE — Telephone Encounter (Signed)
There are many blood pressure medicines and we need to get her bp down Her reaction to lisinopril was not common -- so it is hard to tell what she will tolerate and what not Amlodipine is a totally different drug- so hopefully there will be no problems -- but cannot every guarantee... So I adv she tries it and if she feels bad/ has a side effect , we will keep trying until we find something she can take

## 2011-05-07 ENCOUNTER — Ambulatory Visit (HOSPITAL_COMMUNITY): Payer: BC Managed Care – PPO

## 2011-05-20 ENCOUNTER — Ambulatory Visit (HOSPITAL_COMMUNITY)
Admission: RE | Admit: 2011-05-20 | Discharge: 2011-05-20 | Disposition: A | Discharge status: Home / Self Care | Payer: BC Managed Care – PPO | Source: Ambulatory Visit | Attending: Family Medicine | Admitting: Family Medicine

## 2011-05-20 DIAGNOSIS — Z1231 Encounter for screening mammogram for malignant neoplasm of breast: Secondary | ICD-10-CM

## 2011-05-24 ENCOUNTER — Telehealth: Payer: Self-pay

## 2011-05-24 NOTE — Telephone Encounter (Signed)
Letter mailed to patient as instructed.Health maintenance updated. 

## 2011-05-24 NOTE — Telephone Encounter (Signed)
Message copied by Patience Musca on Sat May 24, 2011 11:22 AM ------      Message from: Roxy Manns A      Created: Wed May 21, 2011  8:45 AM       Mammogram is normal       Please note for flow sheet if you can       Due for next screening mammogram in 1 year

## 2011-05-26 ENCOUNTER — Ambulatory Visit (INDEPENDENT_AMBULATORY_CARE_PROVIDER_SITE_OTHER): Payer: BC Managed Care – PPO | Admitting: Family Medicine

## 2011-05-26 ENCOUNTER — Encounter: Payer: Self-pay | Admitting: Family Medicine

## 2011-05-26 DIAGNOSIS — F411 Generalized anxiety disorder: Secondary | ICD-10-CM

## 2011-05-26 DIAGNOSIS — F419 Anxiety disorder, unspecified: Secondary | ICD-10-CM

## 2011-05-26 DIAGNOSIS — I10 Essential (primary) hypertension: Secondary | ICD-10-CM

## 2011-05-26 NOTE — Assessment & Plan Note (Signed)
This is quite improved Decided not to take buspar  Still worries about her health and will continue counselin g

## 2011-05-26 NOTE — Patient Instructions (Signed)
Blood pressure is better  Stay on the norvasc unless you have problems I'm glad you are feeling better  Follow up in 3 months

## 2011-05-26 NOTE — Progress Notes (Signed)
Subjective:    Patient ID: Karen Rice, female    DOB: Jan 16, 1951, 60 y.o.   MRN: [id number]  HPI Here for bp visit on norvasc after intol to ace  Has been on 3 weeks  Took a while for ace to get out of her system   Has a little jaw pain - strained it while snorkeling  Her dentist gave her a muscle relaxer -- and also anti inflammatory   bp is down today 136/90 - better  Pleased with that  Feels much better than she did with the ace  In ams -- is "heavy feeling " emotionally and physically- but that is getting better  No ankle swelling   Stays active and good diet   Also disc anx and written px for buspar at that time - decided not to fill it  See psychologist for her mood - on and off since she was 30  Anxiety is better than last time    Wt is stable   Patient Active Problem List  Diagnoses  . DEPRESSION  . ALLERGIC RHINITIS  . OSTEOPOROSIS  . VERTIGO  . Hypertension  . Routine general medical examination at a health care facility  . Anxiety   Past Medical History  Diagnosis Date  . Osteoporosis   . Allergy     allergic rhinitis  . Depression   . Gilbert's syndrome     with high bilirubin  . Hypertension   . Osteoporosis    Past Surgical History  Procedure Date  . Tonsillectomy and adenoidectomy 1957   History  Substance Use Topics  . Smoking status: Former Smoker    Quit date: 11/10/2000  . Smokeless tobacco: Not on file  . Alcohol Use: 0.0 oz/week     2 GLASSES OF WINE DAILY   Family History  Problem Relation Age of Onset  . Cancer Mother     breast CA  . Heart disease Mother     heart attack  . Hypertension Mother   . Cancer Father     obstruction mass at cecum  . Hypertension Father   . Hypertension Sister   . Osteopenia Sister   . Heart disease Maternal Grandfather     MI   Allergies  Allergen Reactions  . Lisinopril Other (See Comments)    Severe fatigue  . Tetanus Toxoid     REACTION: very fatigued for 1 week after   Current  Outpatient Prescriptions on File Prior to Visit  Medication Sig Dispense Refill  . amLODipine (NORVASC) 5 MG tablet Take 1 tablet (5 mg total) by mouth daily.  30 tablet  11  . cholecalciferol (VITAMIN D) 1000 UNITS tablet 1 tablet by mouth every other day alternating with multivitamin.       . Multiple Vitamin (MULTIVITAMIN) capsule One tablet every other day alternating with Vitamin D.       . alendronate (FOSAMAX) 70 MG tablet Take 70 mg by mouth every 7 (seven) days. Take with a full glass of water on an empty stomach and stay up.       . fexofenadine-pseudoephedrine (ALLEGRA-D 24) 180-240 MG per 24 hr tablet Once daily as needed.       . Flax OIL 1 teaspoon every other day.a       . Omega-3 Fatty Acids LIQD 1 teaspoon every other day.           Review of Systems Review of Systems  Constitutional: Negative for fever, appetite change,  and  unexpected weight change. mild fatigue intermittently Eyes: Negative for pain and visual disturbance.  Respiratory: Negative for cough and shortness of breath.   Cardiovascular: Negative.  for cp or sob  Gastrointestinal: Negative for nausea, diarrhea and constipation.  Genitourinary: Negative for urgency and frequency.  Skin: Negative for pallor.  Neurological: Negative for weakness, light-headedness, numbness and headaches.  Hematological: Negative for adenopathy. Does not bruise/bleed easily.  Psychiatric/Behavioral: Negative for dysphoric mood. Anxiety is improved         Objective:   Physical Exam  Constitutional: She appears well-developed and well-nourished. No distress.  HENT:  Head: Normocephalic and atraumatic.  Mouth/Throat: Oropharynx is clear and moist.  Eyes: Conjunctivae and EOM are normal. Pupils are equal, round, and reactive to light.  Neck: Normal range of motion. Neck supple. No JVD present. Carotid bruit is not present. Erythema present. No thyromegaly present.  Cardiovascular: Normal rate, regular rhythm and normal  heart sounds.   Pulmonary/Chest: Effort normal and breath sounds normal. No respiratory distress. She has no wheezes.  Abdominal: Soft. Bowel sounds are normal. She exhibits no distension and no mass. There is no tenderness.  Musculoskeletal: Normal range of motion. She exhibits no edema and no tenderness.  Lymphadenopathy:    She has no cervical adenopathy.  Neurological: She is alert. She has normal reflexes. Coordination normal.  Skin: Skin is warm and dry. No rash noted. No erythema. No pallor.  Psychiatric: She has a normal mood and affect.          Assessment & Plan:

## 2011-05-26 NOTE — Assessment & Plan Note (Signed)
bp is better on amlodipine so far - and good health habits Pt overall feels better / is getting used to it  Will continue this and f/u 3 mo

## 2011-07-30 ENCOUNTER — Ambulatory Visit: Payer: BC Managed Care – PPO | Admitting: Family Medicine

## 2011-09-01 ENCOUNTER — Encounter: Payer: Self-pay | Admitting: Family Medicine

## 2011-09-01 ENCOUNTER — Ambulatory Visit (INDEPENDENT_AMBULATORY_CARE_PROVIDER_SITE_OTHER): Payer: BC Managed Care – PPO | Admitting: Family Medicine

## 2011-09-01 VITALS — BP 148/94 | HR 88 | Temp 98.4°F | Ht 63.5 in | Wt 113.2 lb

## 2011-09-01 DIAGNOSIS — I10 Essential (primary) hypertension: Secondary | ICD-10-CM

## 2011-09-01 NOTE — Assessment & Plan Note (Signed)
This is controlled with norvasc 5 - but just at the borderline of high  Excellent lifestyle habits  Enc to continue Pt will check bp at home and if >140/90 call (would consider adding diuretic )  F/u 6 mo  She will get her flu shot at [facility] in the next wk

## 2011-09-01 NOTE — Patient Instructions (Signed)
Keep up the great work with healthy diet and exercise  Second bp here was 140/88 If you have readings above 140 (top) or 90 (bottom ) at home consistently - let me know - we Vanosdol want to add a fluid pill  Schedule PE with labs prior in 6 months  Don't forget to get you flu shot

## 2011-09-01 NOTE — Progress Notes (Signed)
Subjective:    Patient ID: Karen Rice, female    DOB: June 20, 1951, 60 y.o.   MRN: [id number]  HPI Here for f/u of HTN   Wt is up 3 lb with bmi of 19  bp is 148/94- higher today On norvasc 5 Side eff -- feels like it has taken 4 mo for the ace to come out of her system  The norvasc makes her feel tired and flat - but she tolerates it  No cp or ha or palpitations  She has good diet and exercise and watches salt - very healthy   Running about 130/90 at home - a bit lower   Patient Active Problem List  Diagnoses  . DEPRESSION  . ALLERGIC RHINITIS  . OSTEOPOROSIS  . VERTIGO  . Hypertension  . Routine general medical examination at a health care facility  . Anxiety   Past Medical History  Diagnosis Date  . Osteoporosis   . Allergy     allergic rhinitis  . Depression   . Gilbert's syndrome     with high bilirubin  . Hypertension   . Osteoporosis    Past Surgical History  Procedure Date  . Tonsillectomy and adenoidectomy 1957   History  Substance Use Topics  . Smoking status: Former Smoker    Quit date: 11/10/2000  . Smokeless tobacco: Not on file  . Alcohol Use: 0.0 oz/week     2 GLASSES OF WINE DAILY   Family History  Problem Relation Age of Onset  . Cancer Mother     breast CA  . Heart disease Mother     heart attack  . Hypertension Mother   . Cancer Father     obstruction mass at cecum  . Hypertension Father   . Hypertension Sister   . Osteopenia Sister   . Heart disease Maternal Grandfather     MI   Allergies  Allergen Reactions  . Lisinopril Other (See Comments)    Severe fatigue  . Tetanus Toxoid     REACTION: very fatigued for 1 week after   Current Outpatient Prescriptions on File Prior to Visit  Medication Sig Dispense Refill  . amLODipine (NORVASC) 5 MG tablet Take 1 tablet (5 mg total) by mouth daily.  30 tablet  11  . cholecalciferol (VITAMIN D) 1000 UNITS tablet 1 tablet by mouth every other day alternating with multivitamin.       .  Multiple Vitamin (MULTIVITAMIN) capsule One tablet every other day alternating with Vitamin D.       . Omega-3 Fatty Acids LIQD 1 teaspoon every other day.      . fexofenadine-pseudoephedrine (ALLEGRA-D 24) 180-240 MG per 24 hr tablet Once daily as needed.       . Flax OIL 1 teaspoon every other day.a            Review of Systems Review of Systems  Constitutional: Negative for fever, appetite change, and unexpected weight change. some fatigue at times  Eyes: Negative for pain and visual disturbance.  Respiratory: Negative for cough and shortness of breath.   Cardiovascular: Negative for cp or palpitations    Gastrointestinal: Negative for nausea, diarrhea and constipation.  Genitourinary: Negative for urgency and frequency.  Skin: Negative for pallor or rash   Neurological: Negative for weakness, light-headedness, numbness and headaches.  Hematological: Negative for adenopathy. Does not bruise/bleed easily.  Psychiatric/Behavioral: Negative for dysphoric mood. The patient is not nervous/anxious.          Objective:  Physical Exam  Constitutional: She appears well-developed and well-nourished. No distress.  HENT:  Head: Normocephalic and atraumatic.  Mouth/Throat: Oropharynx is clear and moist.  Eyes: Conjunctivae and EOM are normal. Pupils are equal, round, and reactive to light.  Neck: Normal range of motion. Neck supple. No JVD present. Carotid bruit is not present. No thyromegaly present.  Cardiovascular: Normal rate, regular rhythm, normal heart sounds and intact distal pulses.   Pulmonary/Chest: Effort normal and breath sounds normal. No respiratory distress. She has no wheezes.  Abdominal: Soft.  Musculoskeletal: She exhibits no edema.  Lymphadenopathy:    She has no cervical adenopathy.  Neurological: She is alert. She has normal reflexes. Coordination normal.  Skin: Skin is warm and dry. No rash noted. No erythema. No pallor.  Psychiatric: She has a normal mood and  affect.          Assessment & Plan:

## 2011-10-15 ENCOUNTER — Telehealth: Payer: Self-pay | Admitting: Internal Medicine

## 2011-10-15 NOTE — Telephone Encounter (Signed)
Patient notified as instructed by telephone. Pt will call back with info.

## 2011-10-15 NOTE — Telephone Encounter (Signed)
Please have her call her insurance to make sure it is covered before I px it -thanks

## 2011-10-15 NOTE — Telephone Encounter (Signed)
Patient called and stated that she would like to change her blood pressure medication Amlodipine she states it has a depressive state and would like to try Benicar she said two of her friends is taking Benicar and are having no problems with it.  Please advise.

## 2011-10-23 ENCOUNTER — Telehealth: Payer: Self-pay | Admitting: Internal Medicine

## 2011-10-23 NOTE — Telephone Encounter (Signed)
Pt called back and Franky Macho said ins co will fax form for prior auth.

## 2011-10-29 ENCOUNTER — Telehealth: Payer: Self-pay | Admitting: Internal Medicine

## 2011-10-29 NOTE — Telephone Encounter (Signed)
Patient called and would like a call back on the Blood pressure form on Benicar.  Wants to know if it's been faxed in.

## 2011-10-29 NOTE — Telephone Encounter (Signed)
Left v/m for pt to call back. Pam left prior auth on my desk this morning and I put on Dr Royden Purl shelf in the in box. Dr Milinda Antis has been with pt's all day and has not been able to fill our form yet.

## 2011-10-31 NOTE — Telephone Encounter (Signed)
error 

## 2011-11-03 ENCOUNTER — Telehealth: Payer: Self-pay | Admitting: Family Medicine

## 2011-11-03 NOTE — Telephone Encounter (Signed)
Please let pt know benicar is not preferred on her drug plan -- does she want to try it or try one of the prev agents -- cozaar, diovan, avapro ? Let me know I have form on my desk

## 2011-11-03 NOTE — Telephone Encounter (Signed)
Patient notified as instructed by telephone. Pt will ck on cost on Benicar and call back on Wed

## 2011-11-06 ENCOUNTER — Telehealth: Payer: Self-pay | Admitting: Internal Medicine

## 2011-11-06 NOTE — Telephone Encounter (Signed)
Left vm for pt to callback 

## 2011-11-06 NOTE — Telephone Encounter (Signed)
Patient called and wanted to know when the Prior Authorization on Benicar was faxed back to insurance.  She also would like a call from you.  Cell: [phone]

## 2011-11-06 NOTE — Telephone Encounter (Signed)
Pt called and left message with triage for me to call pt back. Left v/m at pt contact #s for pt to call back.

## 2011-11-07 NOTE — Telephone Encounter (Signed)
Spoke with pt and she scheduled appt pt wants to discuss with Dr Milinda Antis about taking Benicar.

## 2011-11-07 NOTE — Telephone Encounter (Signed)
Patient notified as instructed by telephone. Karen Rice has decided to make appt to come in and discuss with Dr Milinda Antis. Dr Milinda Antis will keep form on her desk.

## 2011-11-07 NOTE — Telephone Encounter (Signed)
More than one phone note has been opened for this issue. Spoke with pt on 11/03/11 and was waiting on pt to call back and verify if she wanted Benicar or another BP med. Spoke with pt today and pt wants to come in and discuss with Dr Milinda Antis. Dr Milinda Antis will keep form on her desk and see pt 11/13/11 at 8am.

## 2011-11-12 ENCOUNTER — Encounter: Payer: Self-pay | Admitting: Family Medicine

## 2011-11-12 ENCOUNTER — Ambulatory Visit (INDEPENDENT_AMBULATORY_CARE_PROVIDER_SITE_OTHER): Payer: BC Managed Care – PPO | Admitting: Family Medicine

## 2011-11-12 VITALS — BP 144/92 | HR 76 | Temp 98.4°F | Ht 63.5 in | Wt 114.8 lb

## 2011-11-12 DIAGNOSIS — I10 Essential (primary) hypertension: Secondary | ICD-10-CM

## 2011-11-12 MED ORDER — OLMESARTAN MEDOXOMIL 40 MG PO TABS: 40.0000 mg | ORAL_TABLET | Freq: Every day | ORAL | Status: DC

## 2011-11-12 NOTE — Assessment & Plan Note (Signed)
Pt is now not tolerating norvasc due to fatigue and depressive symptoms  Wants to try benicar-though her ins does not prefer it - will pay out of pocket Px written for benicar 40 inst to update if side eff or problems  F/u in 4-6 weeks  Urged to keep up great health habits

## 2011-11-12 NOTE — Progress Notes (Signed)
Subjective:    Patient ID: Karen Rice, female    DOB: September 23, 1951, 61 y.o.   MRN: [id number]  HPI Here for HTN -- and is interested in taking benicar -- a friend took it  Her plan does not cover it well -- but she wants to try it anyway-= wants to pay the money Does not want to try another arb Feels like norvasc depresses her and takes her energy away -- for the first half of the day   bp is   144/92  Today At home 120/90s  No cp or palpitations or headaches or edema  No side effects to medicines    Patient Active Problem List  Diagnoses  . DEPRESSION  . ALLERGIC RHINITIS  . OSTEOPOROSIS  . VERTIGO  . Hypertension  . Routine general medical examination at a health care facility  . Anxiety   Past Medical History  Diagnosis Date  . Osteoporosis   . Allergy     allergic rhinitis  . Depression   . Gilbert's syndrome     with high bilirubin  . Hypertension   . Osteoporosis    Past Surgical History  Procedure Date  . Tonsillectomy and adenoidectomy 1957   History  Substance Use Topics  . Smoking status: Former Smoker    Quit date: 11/10/2000  . Smokeless tobacco: Not on file  . Alcohol Use: 0.0 oz/week     2 GLASSES OF WINE DAILY   Family History  Problem Relation Age of Onset  . Cancer Mother     breast CA  . Heart disease Mother     heart attack  . Hypertension Mother   . Cancer Father     obstruction mass at cecum  . Hypertension Father   . Hypertension Sister   . Osteopenia Sister   . Heart disease Maternal Grandfather     MI   Allergies  Allergen Reactions  . Lisinopril Other (See Comments)    Severe fatigue  . Tetanus Toxoid     REACTION: very fatigued for 1 week after   Current Outpatient Prescriptions on File Prior to Visit  Medication Sig Dispense Refill  . cholecalciferol (VITAMIN D) 1000 UNITS tablet 1 tablet by mouth every other day alternating with multivitamin.       . Multiple Vitamin (MULTIVITAMIN) capsule One tablet every other day  alternating with Vitamin D.       . Omega-3 Fatty Acids LIQD 1 teaspoon every other day.      . Flax OIL 1 teaspoon every other day.a           Review of Systems Review of Systems  Constitutional: Negative for fever, appetite change, and unexpected weight change. pos for fatigue  Eyes: Negative for pain and visual disturbance.  Respiratory: Negative for cough and shortness of breath.   Cardiovascular: Negative for cp or palpitations    Gastrointestinal: Negative for nausea, diarrhea and constipation.  Genitourinary: Negative for urgency and frequency.  Skin: Negative for pallor or rash   Neurological: Negative for weakness, light-headedness, numbness and headaches.  Hematological: Negative for adenopathy. Does not bruise/bleed easily.  Psychiatric/Behavioral: pos  for dysphoric mood. The patient is not nervous/anxious.          Objective:   Physical Exam  Constitutional: She appears well-developed and well-nourished. No distress.  HENT:  Head: Normocephalic and atraumatic.  Mouth/Throat: Oropharynx is clear and moist.  Eyes: Conjunctivae and EOM are normal. Pupils are equal, round, and reactive to light.  No scleral icterus.  Neck: Normal range of motion. Neck supple. No JVD present. Carotid bruit is not present. No thyromegaly present.  Cardiovascular: Normal rate, regular rhythm, normal heart sounds and intact distal pulses.  Exam reveals no gallop.   Pulmonary/Chest: Effort normal and breath sounds normal. No respiratory distress. She has no wheezes.  Abdominal: Soft. Bowel sounds are normal.  Musculoskeletal: She exhibits no edema.  Lymphadenopathy:    She has no cervical adenopathy.  Neurological: She is alert. She has normal reflexes. No cranial nerve deficit. She exhibits normal muscle tone. Coordination normal.  Skin: Skin is warm and dry. No rash noted. No erythema. No pallor.  Psychiatric: She has a normal mood and affect.       Talkative Not seemingly depressed           Assessment & Plan:

## 2011-11-12 NOTE — Patient Instructions (Signed)
Stop norvasc and start benicar for hypertension Update me if any problems/ side effects or if you want to change to a cheaper drug  Keep up the great health habits  Follow up in 4-6 weeks (keep checking bp at home)

## 2011-12-12 ENCOUNTER — Ambulatory Visit: Payer: BC Managed Care – PPO | Admitting: Family Medicine

## 2011-12-26 ENCOUNTER — Ambulatory Visit: Payer: BC Managed Care – PPO | Admitting: Family Medicine

## 2011-12-29 ENCOUNTER — Ambulatory Visit: Payer: BC Managed Care – PPO | Admitting: Family Medicine

## 2012-01-12 ENCOUNTER — Ambulatory Visit (INDEPENDENT_AMBULATORY_CARE_PROVIDER_SITE_OTHER): Payer: BC Managed Care – PPO | Admitting: Family Medicine

## 2012-01-12 ENCOUNTER — Encounter: Payer: Self-pay | Admitting: Family Medicine

## 2012-01-12 VITALS — BP 120/78 | HR 88 | Temp 98.4°F | Ht 63.5 in | Wt 115.2 lb

## 2012-01-12 DIAGNOSIS — I10 Essential (primary) hypertension: Secondary | ICD-10-CM

## 2012-01-12 NOTE — Patient Instructions (Addendum)
Continue benicar at current dose  Keep up healthy habits  bp is very good !

## 2012-01-12 NOTE — Assessment & Plan Note (Signed)
Much imp bp 2nd check 120/78 Doing great with benicar 20 mg  Will stay on that Lab and PE coming up Disc good health habits

## 2012-01-12 NOTE — Progress Notes (Signed)
Subjective:    Patient ID: Karen Rice, female    DOB: [birth year], 61 y.o.   MRN: [id number]  HPI Here for f/u of HTN   Has been very good 120/80, 110/78 are examples with no problems   Stress - partner just had double mastectomy - and is recovery  Has to have chemo and radiation    Intol of ace and amlodipine Started benicar last visit- really likes it and taking 1/2 pill (20 mg )   bp is  158/84   Today- is up  No cp or palpitations or headaches or edema  No side effects to medicines      Chemistry      Component Value Date/Time   NA 140 01/03/2011 0841   K 4.6 01/03/2011 0841   CL 103 01/03/2011 0841   CO2 28 01/03/2011 0841   BUN 13 01/03/2011 0841   CREATININE 0.7 01/03/2011 0841      Component Value Date/Time   CALCIUM 9.7 01/03/2011 0841   ALKPHOS 53 01/03/2011 0841   AST 22 01/03/2011 0841   ALT 14 01/03/2011 0841   BILITOT 1.9* 01/03/2011 0841     will have labs before PE in a few months  Review of Systems  Patient Active Problem List  Diagnoses  . DEPRESSION  . ALLERGIC RHINITIS  . OSTEOPOROSIS  . VERTIGO  . Hypertension  . Routine general medical examination at a health care facility  . Anxiety   Past Medical History  Diagnosis Date  . Osteoporosis   . Allergy     allergic rhinitis  . Depression   . Gilbert's syndrome     with high bilirubin  . Hypertension   . Osteoporosis    Past Surgical History  Procedure Date  . Tonsillectomy and adenoidectomy 1957   History  Substance Use Topics  . Smoking status: Former Smoker    Quit date: 11/10/2000  . Smokeless tobacco: Not on file  . Alcohol Use: 0.0 oz/week     2 GLASSES OF WINE DAILY   Family History  Problem Relation Age of Onset  . Cancer Mother     breast CA  . Heart disease Mother     heart attack  . Hypertension Mother   . Cancer Father     obstruction mass at cecum  . Hypertension Father   . Hypertension Sister   . Osteopenia Sister   . Heart disease Maternal Grandfather     MI    Allergies  Allergen Reactions  . Lisinopril Other (See Comments)    Severe fatigue  . Norvasc (Amlodipine Besylate)     Fatigue/ depression  . Tetanus Toxoid     REACTION: very fatigued for 1 week after   Current Outpatient Prescriptions on File Prior to Visit  Medication Sig Dispense Refill  . cholecalciferol (VITAMIN D) 1000 UNITS tablet 1 tablet by mouth every other day alternating with multivitamin.       . Flax OIL 1 teaspoon every other day.a       . Multiple Vitamin (MULTIVITAMIN) capsule One tablet every other day alternating with Vitamin D.       . Omega-3 Fatty Acids LIQD 1 teaspoon every other day.            Objective:   Physical Exam  Constitutional: She appears well-developed and well-nourished. No distress.  HENT:  Head: Normocephalic and atraumatic.  Mouth/Throat: Oropharynx is clear and moist.  Eyes: Conjunctivae and EOM are normal. Pupils are equal,  round, and reactive to light. No scleral icterus.  Neck: Normal range of motion. Neck supple. No JVD present. Carotid bruit is not present. No thyromegaly present.  Cardiovascular: Normal rate, regular rhythm, normal heart sounds and intact distal pulses.  Exam reveals no gallop.   Pulmonary/Chest: Effort normal and breath sounds normal. No respiratory distress. She has no wheezes. She exhibits no tenderness.  Abdominal: Soft. Bowel sounds are normal. She exhibits no abdominal bruit.  Musculoskeletal: She exhibits no edema.  Lymphadenopathy:    She has no cervical adenopathy.  Neurological: She is alert. She has normal reflexes. No cranial nerve deficit. She exhibits normal muscle tone. Coordination normal.  Skin: Skin is warm and dry. No rash noted. No erythema. No pallor.  Psychiatric: She has a normal mood and affect.          Assessment & Plan:

## 2012-02-12 ENCOUNTER — Telehealth: Payer: Self-pay

## 2012-02-12 MED ORDER — OLMESARTAN MEDOXOMIL 20 MG PO TABS: 20.0000 mg | ORAL_TABLET | Freq: Every day | ORAL | Status: DC

## 2012-02-12 NOTE — Telephone Encounter (Signed)
Pt said Benicar 40 mg pt taking 1/2 tab is almost gone and would like new rx for Benicar 20 mg sent to [facility]. Pt can be reached at [phone].

## 2012-02-12 NOTE — Telephone Encounter (Signed)
Patient advised via message left on machine at home number. 

## 2012-02-12 NOTE — Telephone Encounter (Signed)
Will refill electronically  

## 2012-02-23 ENCOUNTER — Other Ambulatory Visit: Payer: BC Managed Care – PPO

## 2012-03-01 ENCOUNTER — Encounter: Payer: BC Managed Care – PPO | Admitting: Family Medicine

## 2012-04-13 ENCOUNTER — Other Ambulatory Visit: Payer: Self-pay | Admitting: Obstetrics and Gynecology

## 2012-04-13 DIAGNOSIS — Z1231 Encounter for screening mammogram for malignant neoplasm of breast: Secondary | ICD-10-CM

## 2012-04-29 ENCOUNTER — Other Ambulatory Visit: Payer: Self-pay | Admitting: Obstetrics and Gynecology

## 2012-04-29 ENCOUNTER — Ambulatory Visit (INDEPENDENT_AMBULATORY_CARE_PROVIDER_SITE_OTHER): Payer: BC Managed Care – PPO

## 2012-04-29 DIAGNOSIS — M810 Age-related osteoporosis without current pathological fracture: Secondary | ICD-10-CM

## 2012-05-04 ENCOUNTER — Encounter: Payer: Self-pay | Admitting: Obstetrics and Gynecology

## 2012-05-04 ENCOUNTER — Ambulatory Visit (INDEPENDENT_AMBULATORY_CARE_PROVIDER_SITE_OTHER): Payer: BC Managed Care – PPO | Admitting: Obstetrics and Gynecology

## 2012-05-04 VITALS — BP 130/82 | Ht 64.0 in | Wt 114.0 lb

## 2012-05-04 DIAGNOSIS — Z01419 Encounter for gynecological examination (general) (routine) without abnormal findings: Secondary | ICD-10-CM

## 2012-05-04 MED ORDER — RISEDRONATE SODIUM 150 MG PO TABS: 150.0000 mg | ORAL_TABLET | ORAL | Status: DC

## 2012-05-04 NOTE — Progress Notes (Signed)
Patient came back to see me today for her annual GYN exam. She is fine without hormone replacement. She is having no vaginal bleeding. She is having no pelvic pain. She is up-to-date on mammograms. She took Fosamax for greater than 5 years and has been on drug holiday for one year. A bone density was done here on 04/29/2012 she is in the osteoporotic range and she has statistically significant bone loss in both the spine and the hip. She does her lab work through  her PCP. Her mother had breast cancer at age 59. This is her only family history of breast or ovarian cancer. Her mother is deceased. She has always had normal Pap smears. Her last Pap was June of 2012.  Physical examination: Kennon Portela present. HEENT within normal limits. Neck: Thyroid not large. No masses. Supraclavicular nodes: not enlarged. Breasts: Examined in both sitting and lying  position. No skin changes and no masses. Abdomen: Soft no guarding rebound or masses or hernia. Pelvic: External: Within normal limits. BUS: Within normal limits. Vaginal:within normal limits. Poor  estrogen effect. No evidence of cystocele rectocele or enterocele. Cervix: clean. Uterus: Normal size and shape. Adnexa: No masses. Rectovaginal exam: Confirmatory and negative. Extremities: Within normal limits.  Assessment: Osteoporosis. Asymptomatic atrophic vaginitis. Family history of early onset breast cancer.  Plan: We discussed BRCA1 and BRCA2 testing. She will go to the cancer center for counseling. We discussed reinitiating treatment for osteoporosis. We discussed Reclast and Prolia. She elected Actonel 150 mg monthly. No Pap done.

## 2012-05-05 LAB — URINALYSIS W MICROSCOPIC + REFLEX CULTURE
Bacteria, UA: NONE SEEN
Bilirubin Urine: NEGATIVE
Casts: NONE SEEN
Crystals: NONE SEEN
Ketones, ur: NEGATIVE mg/dL
Specific Gravity, Urine: 1.006 (ref 1.005–1.030)
Squamous Epithelial / LPF: NONE SEEN
Urobilinogen, UA: 0.2 mg/dL (ref 0.0–1.0)
pH: 7 (ref 5.0–8.0)

## 2012-05-06 ENCOUNTER — Encounter: Payer: Self-pay | Admitting: Obstetrics and Gynecology

## 2012-05-06 ENCOUNTER — Telehealth: Payer: Self-pay | Admitting: *Deleted

## 2012-05-06 NOTE — Telephone Encounter (Signed)
Patient called to schedule a genetics appt.  Confirmed 06/28/12 genetic appt w/ pt.

## 2012-05-12 LAB — HM DEXA SCAN

## 2012-05-25 ENCOUNTER — Ambulatory Visit (HOSPITAL_COMMUNITY): Payer: BC Managed Care – PPO

## 2012-05-28 ENCOUNTER — Telehealth: Payer: Self-pay | Admitting: *Deleted

## 2012-05-28 NOTE — Telephone Encounter (Signed)
(  Pt aware you are out of the office) pt is on Actonel 150 monthly would like to switch due to cost $150 per month for medication. Pt spoke with the pharmacist and was told that Boniva would be cheaper if okay with doctor and it comes in generic form. Pt would like rx for this if possible. Please advise

## 2012-06-07 NOTE — Telephone Encounter (Signed)
She can take Boniva 150mg m monthly. She takes one pill monthly just like she did Actonel. She takes it the same way (first thing in the morning on empty stomach with 8 oz of water). She needs to stay upright for 1 hour and not eat or take any other medication for the hour.

## 2012-06-08 ENCOUNTER — Ambulatory Visit (HOSPITAL_COMMUNITY)
Admission: RE | Admit: 2012-06-08 | Discharge: 2012-06-08 | Disposition: A | Discharge status: Home / Self Care | Payer: BC Managed Care – PPO | Source: Ambulatory Visit | Attending: Obstetrics and Gynecology | Admitting: Obstetrics and Gynecology

## 2012-06-08 ENCOUNTER — Other Ambulatory Visit: Payer: Self-pay | Admitting: *Deleted

## 2012-06-08 DIAGNOSIS — Z1231 Encounter for screening mammogram for malignant neoplasm of breast: Secondary | ICD-10-CM | POA: Insufficient documentation

## 2012-06-08 MED ORDER — IBANDRONATE SODIUM 150 MG PO TABS: 150.0000 mg | ORAL_TABLET | ORAL | Status: DC

## 2012-06-08 NOTE — Telephone Encounter (Signed)
Patient informed rx sent in. 

## 2012-06-25 ENCOUNTER — Telehealth: Payer: Self-pay | Admitting: *Deleted

## 2012-06-25 NOTE — Telephone Encounter (Signed)
Prior Berkley Harvey is needed for benicar, form from [place] is on your shelf.

## 2012-06-25 NOTE — Telephone Encounter (Signed)
Looking at the form- the ins co will pay for other ARB drugs but not the benicar --- options include cozaar, diovan, avapro  I do not think she has tried any of these before- they are in the same class, is she open to trying one of them? Or does she want to pay out of pocket for the benicar? I will hold the prior auth on my desk

## 2012-06-28 ENCOUNTER — Ambulatory Visit: Payer: BC Managed Care – PPO | Admitting: Genetic Counselor

## 2012-06-28 ENCOUNTER — Encounter: Payer: Self-pay | Admitting: Genetic Counselor

## 2012-06-28 ENCOUNTER — Other Ambulatory Visit: Payer: BC Managed Care – PPO | Admitting: Lab

## 2012-06-28 DIAGNOSIS — Z803 Family history of malignant neoplasm of breast: Secondary | ICD-10-CM

## 2012-06-28 NOTE — Progress Notes (Signed)
Dr.  Milinda Antis requested a consultation for genetic counseling and risk assessment for Karen Rice, a 61 y.o. female, for discussion of her family history of breast cancer. She presents to clinic today to discuss the possibility of a genetic predisposition to cancer, and to further clarify her risks, as well as her family members' risks for cancer.   HISTORY OF PRESENT ILLNESS: Karen Rice is a 61 y.o. female with no personal history of cancer.    Past Medical History  Diagnosis Date  . Osteoporosis   . Allergy     allergic rhinitis  . Gilbert's syndrome     with high bilirubin  . Hypertension   . Osteoporosis     Past Surgical History  Procedure Date  . Tonsillectomy and adenoidectomy 1957    History  Substance Use Topics  . Smoking status: Former Smoker    Quit date: 11/10/2000  . Smokeless tobacco: Not on file  . Alcohol Use: 7.0 oz/week    14 drink(s) per week     2 GLASSES OF WINE DAILY    REPRODUCTIVE HISTORY AND PERSONAL RISK ASSESSMENT FACTORS: Menarche was at age 80.   Menopause at 52-53 Uterus Intact: Yes Ovaries Intact: Yes [id number] , first live birth at age 64  She has not previously undergone treatment for infertility.   Never used OCPs   She has not used HRT in the past.    FAMILY HISTORY:  We obtained a detailed, 4-generation family history.  Significant diagnoses are listed below: Family History  Problem Relation Age of Onset  . Heart disease Mother     heart attack  . Hypertension Mother   . Breast cancer Mother     ages 47 and 74  . Hypertension Father   . Hypertension Sister   . Osteopenia Sister   . Heart disease Maternal Grandfather     MI  . Lung cancer Paternal Uncle     heavy smoker  The patient has never been diagnosed with breast cancer.  Her mother was diagnosed with breast cancer at ages 38 and 54.  Her mother's maternal great aunt (the patient's great-great aunt) had uterine cancer.  The patient's paternal uncle had lung cancer.  He was  a heavy smoker.  There is no other reported family history of cancer.  Patient's maternal ancestors are of [place] descent, and paternal ancestors are of [place] and [place] descent. There is no reported Ashkenazi Jewish ancestry. There is no known consanguinity.  GENETIC COUNSELING RISK ASSESSMENT, DISCUSSION, AND SUGGESTED FOLLOW UP: We reviewed the natural history and genetic etiology of sporadic, familial and hereditary cancer syndromes.   About 5-10% of breast cancer is hereditary.  Of this, about 85% is the result of a BRCA1 or BRCA2 mutation.  We reviewed the red flags of hereditary cancer syndromes and the dominant inheritance patterns.  The patient's family history is suggestive of the following possible diagnosis: hereditary cancer syndrome  We discussed that identification of a hereditary cancer syndrome Tom help her care providers tailor the patients medical management. If a mutation indicating a hereditary cancer syndrome is detected in this case, the [employer] recommendations would include increased cancer survillance and possible prophylactic surgery. If a mutation is detected, the patient will be referred back to the referring provider and to any additional appropriate care providers to discuss the relevant options.   If a mutation is not found in the patient, this will decrease the likelihood of a hereditary cancer  syndrome as the explanation for her  Family history. Cancer surveillance options would be discussed for the patient according to the appropriate standard National Comprehensive Cancer Network and American Cancer Society guidelines, with consideration of their personal and family history risk factors. In this case, the patient will be referred back to their care providers for discussions of management.   In order to estimate her chance of having a BRCA1 or BRCA2 mutation, we used statistical models (Penn II and tyrer cusik) and laboratory data that  take into account her personal medical history, family history and ancestry.  Because each model is different, there can be a lot of variability in the risks they give.  Therefore, these numbers must be considered a rough range and not a precise risk of having a BRCA1 or BRCA2 mutation.  These models estimate that she has approximately a 1.2-3% chance of having a mutation.   Based on the patient's personal and family history, statistical models (tyrer cusik)  and literature data were used to estimate her risk of developing breast cancer. These estimate her lifetime risk of developing breast cancer to be approximately 16.9%. This estimation does not take into account any genetic testing results.   After considering the risks, benefits, and limitations, the patient decided to think about genetic testing.   The patient was seen for a total of 60 minutes, greater than 50% of which was spent face-to-face counseling.  This plan is being carried out per Dr. Royden Purl recommendations.  This note will also be sent to the referring provider via the electronic medical record. The patient will be supplied with a summary of this genetic counseling discussion as well as educational information on the discussed hereditary cancer syndromes following the conclusion of their visit.   Patient was discussed with Dr. Drue Second.    _______________________________________________________________________ For Office Staff:  Number of people involved in session: 2 Was an Intern/ student involved with case: not applicable

## 2012-06-29 NOTE — Telephone Encounter (Signed)
left message on patient Vm to see if she wanted to try another medication besides the benicar.

## 2012-06-30 NOTE — Telephone Encounter (Signed)
Spoke to patient she stated that she wants to stay on the benicar and that she will talk to you about it when she come in for her visit in September.  She said that anything we can do to help her out would be appreciated. Call back number is [phone].

## 2012-06-30 NOTE — Telephone Encounter (Signed)
She will have to pay out of pocket since this is not an approved ARB for her plan and she has not tried the others Unfortunately nothing I can do about that Will disc further at f/u Thanks

## 2012-07-01 NOTE — Telephone Encounter (Signed)
Left message on patient voicemail advising her as instructed about her insurance not covering her benicar. Also faxed Drug Certification to [facility] @ [phone]

## 2012-07-16 ENCOUNTER — Encounter: Payer: Self-pay | Admitting: Genetic Counselor

## 2012-07-18 ENCOUNTER — Telehealth: Payer: Self-pay | Admitting: Family Medicine

## 2012-07-18 DIAGNOSIS — M810 Age-related osteoporosis without current pathological fracture: Secondary | ICD-10-CM

## 2012-07-18 DIAGNOSIS — Z0000 Encounter for general adult medical examination without abnormal findings: Secondary | ICD-10-CM

## 2012-07-18 NOTE — Telephone Encounter (Signed)
Message copied by Judy Pimple on Sun Jul 18, 2012 12:13 PM ------      Message from: Karen Rice      Created: Tue Jul 06, 2012  3:30 PM      Regarding: Cpx labs Mon 9/9       Please order  future cpx labs for pt's upcomming lab appt.      Thanks      Rodney Booze

## 2012-07-19 ENCOUNTER — Other Ambulatory Visit (INDEPENDENT_AMBULATORY_CARE_PROVIDER_SITE_OTHER): Payer: BC Managed Care – PPO

## 2012-07-19 DIAGNOSIS — Z0000 Encounter for general adult medical examination without abnormal findings: Secondary | ICD-10-CM

## 2012-07-19 DIAGNOSIS — Z13220 Encounter for screening for lipoid disorders: Secondary | ICD-10-CM

## 2012-07-19 DIAGNOSIS — M810 Age-related osteoporosis without current pathological fracture: Secondary | ICD-10-CM

## 2012-07-19 LAB — CBC WITH DIFFERENTIAL/PLATELET
Basophils Relative: 0.8 % (ref 0.0–3.0)
Eosinophils Absolute: 0.4 10*3/uL (ref 0.0–0.7)
MCHC: 33 g/dL (ref 30.0–36.0)
MCV: 96.2 fl (ref 78.0–100.0)
Monocytes Absolute: 0.4 10*3/uL (ref 0.1–1.0)
Neutrophils Relative %: 53.5 % (ref 43.0–77.0)
Platelets: 272 10*3/uL (ref 150.0–400.0)

## 2012-07-19 LAB — COMPREHENSIVE METABOLIC PANEL
AST: 22 U/L (ref 0–37)
Albumin: 4.8 g/dL (ref 3.5–5.2)
Alkaline Phosphatase: 43 U/L (ref 39–117)
BUN: 13 mg/dL (ref 6–23)
Potassium: 4.5 mEq/L (ref 3.5–5.1)
Sodium: 137 mEq/L (ref 135–145)
Total Bilirubin: 1.8 mg/dL — ABNORMAL HIGH (ref 0.3–1.2)

## 2012-07-19 LAB — TSH: TSH: 1.41 u[IU]/mL (ref 0.35–5.50)

## 2012-07-19 LAB — LIPID PANEL
HDL: 99 mg/dL (ref >39.00)
Total CHOL/HDL Ratio: 2
Triglycerides: 45 mg/dL (ref 0.0–149.0)
VLDL: 9 mg/dL (ref 0.0–40.0)

## 2012-07-26 ENCOUNTER — Ambulatory Visit (INDEPENDENT_AMBULATORY_CARE_PROVIDER_SITE_OTHER): Payer: BC Managed Care – PPO | Admitting: Family Medicine

## 2012-07-26 ENCOUNTER — Encounter: Payer: Self-pay | Admitting: Family Medicine

## 2012-07-26 VITALS — BP 140/98 | HR 80 | Temp 98.0°F | Ht 63.5 in | Wt 114.0 lb

## 2012-07-26 DIAGNOSIS — M810 Age-related osteoporosis without current pathological fracture: Secondary | ICD-10-CM

## 2012-07-26 DIAGNOSIS — I10 Essential (primary) hypertension: Secondary | ICD-10-CM

## 2012-07-26 DIAGNOSIS — Z23 Encounter for immunization: Secondary | ICD-10-CM

## 2012-07-26 DIAGNOSIS — Z0000 Encounter for general adult medical examination without abnormal findings: Secondary | ICD-10-CM

## 2012-07-26 MED ORDER — VALSARTAN 80 MG PO TABS: 80.0000 mg | ORAL_TABLET | Freq: Every day | ORAL | Status: DC

## 2012-07-26 NOTE — Progress Notes (Signed)
Subjective:    Patient ID: Karen Rice, female    DOB: 02/08/1951, 61 y.o.   MRN: [id number]  HPI Here for health maintenance exam and to review chronic medical problems   Doing well and feeling great overall   Has had a rough 8 months  Her partner is going through cancer - just finished chemo   Flu shot - will get it today  Zoster status - has not checked with her insurance    Pap 7/13 gyn- Dr Dianne Dun - all was good  Did not do a pap smear   Mammo7/13 - normal  No lumps on self exam  Mother had breast cancer at 8 Pt had genetic counseling - for [facility]- is interested  Colon 11/09 Totally clear- 10 years   Sees dermatologist next Tuesday for regular visit/ skin cancer screening   Wt stable with bmi of 19  bp is stable today  No cp or palpitations or headaches or edema  No side effects to medicines  BP Readings from Last 3 Encounters:  07/26/12 140/98  05/04/12 130/82  01/12/12 120/78   thinks she has white coat HTN At home reliably 120s/80s  Is on benicar - cannot afford  Will need to change to diovan     Osteoporosis - on boniva - from gyn  Is on her ca and D (1000 of ca and 1000 D )  Vit D level is normal  Last dexa was this summer  Lab Results  Component Value Date   CHOL 201* 07/19/2012   CHOL 191 01/03/2011   CHOL 210* 12/26/2008   Lab Results  Component Value Date   HDL 99.00 07/19/2012   HDL 104.70 01/03/2011   HDL 97.1 12/26/2008   Lab Results  Component Value Date   LDLCALC 80 01/03/2011   Lab Results  Component Value Date   TRIG 45.0 07/19/2012   TRIG 34.0 01/03/2011   TRIG 49 12/26/2008   Lab Results  Component Value Date   CHOLHDL 2 07/19/2012   CHOLHDL 2 01/03/2011   CHOLHDL 2.2 CALC 12/26/2008   Lab Results  Component Value Date   LDLDIRECT 94.6 07/19/2012   LDLDIRECT 89.7 12/26/2008    Very good cholesterol!- good diet   Patient Active Problem List  Diagnosis  . DEPRESSION  . ALLERGIC RHINITIS  . OSTEOPOROSIS  . VERTIGO  .  Hypertension  . Routine general medical examination at a health care facility  . Anxiety   Past Medical History  Diagnosis Date  . Osteoporosis   . Allergy     allergic rhinitis  . Gilbert's syndrome     with high bilirubin  . Hypertension   . Osteoporosis    Past Surgical History  Procedure Date  . Tonsillectomy and adenoidectomy 1957   History  Substance Use Topics  . Smoking status: Former Smoker    Quit date: 11/10/2000  . Smokeless tobacco: Not on file  . Alcohol Use: 7.0 oz/week    14 drink(s) per week     2 GLASSES OF WINE DAILY   Family History  Problem Relation Age of Onset  . Heart disease Mother     heart attack  . Hypertension Mother   . Breast cancer Mother     ages 81 and 57  . Hypertension Father   . Hypertension Sister   . Osteopenia Sister   . Heart disease Maternal Grandfather     MI  . Lung cancer Paternal Uncle     heavy  smoker   Allergies  Allergen Reactions  . Lisinopril Other (See Comments)    Severe fatigue  . Norvasc (Amlodipine Besylate)     Fatigue/ depression  . Tetanus Toxoid     REACTION: very fatigued for 1 week after   Current Outpatient Prescriptions on File Prior to Visit  Medication Sig Dispense Refill  . ibandronate (BONIVA) 150 MG tablet Take 1 tablet (150 mg total) by mouth every 30 (thirty) days. Take in the morning with a full glass of water, on an empty stomach, and do not take anything else by mouth or lie down for the next 30 min.  1 tablet  12  . Multiple Vitamin (MULTIVITAMIN) capsule One tablet every other day alternating with Vitamin D.       . olmesartan (BENICAR) 20 MG tablet Take 1 tablet (20 mg total) by mouth daily.  30 tablet  11    Review of Systems Review of Systems  Constitutional: Negative for fever, appetite change, fatigue and unexpected weight change.  Eyes: Negative for pain and visual disturbance.  Respiratory: Negative for cough and shortness of breath.   Cardiovascular: Negative for cp or  palpitations    Gastrointestinal: Negative for nausea, diarrhea and constipation.  Genitourinary: Negative for urgency and frequency.  Skin: Negative for pallor or rash   Neurological: Negative for weakness, light-headedness, numbness and headaches.  Hematological: Negative for adenopathy. Does not bruise/bleed easily.  Psychiatric/Behavioral: Negative for dysphoric mood. The patient is not nervous/anxious.  pos for stressors, but pt is handling them well        Objective:   Physical Exam  Constitutional: She appears well-developed and well-nourished. No distress.  HENT:  Head: Normocephalic and atraumatic.  Right Ear: External ear normal.  Left Ear: External ear normal.  Nose: Nose normal.  Mouth/Throat: Oropharynx is clear and moist.  Eyes: Conjunctivae normal and EOM are normal. Pupils are equal, round, and reactive to light. Right eye exhibits no discharge. Left eye exhibits no discharge.  Neck: Normal range of motion. Neck supple. No JVD present. Carotid bruit is not present. No thyromegaly present.  Cardiovascular: Normal rate, regular rhythm, normal heart sounds and intact distal pulses.  Exam reveals no gallop.   Pulmonary/Chest: Effort normal and breath sounds normal. No respiratory distress. She has no wheezes.  Abdominal: Soft. Bowel sounds are normal. She exhibits no distension, no abdominal bruit and no mass. There is no tenderness.  Musculoskeletal: She exhibits no edema and no tenderness.  Lymphadenopathy:    She has no cervical adenopathy.  Neurological: She is alert. She has normal reflexes. No cranial nerve deficit. She exhibits normal muscle tone. Coordination normal.  Skin: Skin is warm and dry. No rash noted. No erythema. No pallor.  Psychiatric: She has a normal mood and affect.          Assessment & Plan:

## 2012-07-26 NOTE — Assessment & Plan Note (Signed)
Some whitecoat issues in office but well controlled at home with benicar Must change to diovan however for ins reasons- do not expect problems Will watch at home F/u 6 mo Labs reviewed

## 2012-07-26 NOTE — Assessment & Plan Note (Signed)
Reviewed health habits including diet and exercise and skin cancer prevention Also reviewed health mt list, fam hx and immunizations   Rev wellness labs in detail  Flu shot today Will consider zostavax if ins covers it

## 2012-07-26 NOTE — Patient Instructions (Addendum)
Flu shot today Continue your calcium and vitamin D When benicar runs out - here is px for diovan -- keep an eye on your bp If you are interested in shingles vaccine in future - call your insurance company to see how coverage is and call us to schedule or we will call it in to a phamacy  Keep exercising

## 2012-07-26 NOTE — Assessment & Plan Note (Signed)
On boniva from her gyn Last dexa was 7/13 Disc ca and D and exercise

## 2013-01-24 ENCOUNTER — Ambulatory Visit: Payer: BC Managed Care – PPO | Admitting: Family Medicine

## 2013-02-21 ENCOUNTER — Ambulatory Visit (INDEPENDENT_AMBULATORY_CARE_PROVIDER_SITE_OTHER): Payer: BC Managed Care – PPO | Admitting: Family Medicine

## 2013-02-21 ENCOUNTER — Encounter: Payer: Self-pay | Admitting: Family Medicine

## 2013-02-21 ENCOUNTER — Other Ambulatory Visit: Payer: Self-pay

## 2013-02-21 VITALS — BP 130/80 | HR 84 | Temp 98.1°F | Ht 63.5 in | Wt 113.2 lb

## 2013-02-21 DIAGNOSIS — I10 Essential (primary) hypertension: Secondary | ICD-10-CM

## 2013-02-21 MED ORDER — OLMESARTAN MEDOXOMIL 20 MG PO TABS: 20.0000 mg | ORAL_TABLET | Freq: Every day | ORAL | Status: DC

## 2013-02-21 NOTE — Telephone Encounter (Signed)
Pt left v/m requesting refill benicar to [facility]. Pt notified refill done via v/m.

## 2013-02-21 NOTE — Assessment & Plan Note (Signed)
Pt decided to stay on benicar instead of changing Will continue this bp in fair control at this time  No changes needed  Disc lifstyle change with low sodium diet and exercise

## 2013-02-21 NOTE — Progress Notes (Signed)
Subjective:    Patient ID: Karen Rice, female    DOB: September 04, 1951, 62 y.o.   MRN: [id number]  HPI Here for f/u of chronic conditions  Is feeling great overall   Stressors- her partner went through chemo for 15 mo - is getting back to normal  Getting help with a Pharmacist, hospital and lots of support   Is on benicar for HTN Had insurance issues with that  (never did change to diovan after all)  Wants to stick with benicar if she can afford it  bp is stable today  No cp or palpitations or headaches or edema  No side effects to medicines  BP Readings from Last 3 Encounters:  02/21/13 142/80  07/26/12 140/98  05/04/12 130/82     Her GYN retired (Dr Dianne Dun)- will be seeing Dr Hyacinth Meeker in the future ? If will consider prolia  Did not tolerate boniva (GI side effects)  Zoster vaccine- has not checked with insurance   Mood- is improving  Wt is stable with bmi of 19  Patient Active Problem List  Diagnosis  . DEPRESSION  . ALLERGIC RHINITIS  . OSTEOPOROSIS  . Hypertension  . Routine general medical examination at a health care facility  . Anxiety   Past Medical History  Diagnosis Date  . Osteoporosis   . Allergy     allergic rhinitis  . Gilbert's syndrome     with high bilirubin  . Hypertension   . Osteoporosis    Past Surgical History  Procedure Laterality Date  . Tonsillectomy and adenoidectomy  1957   History  Substance Use Topics  . Smoking status: Former Smoker    Quit date: 11/10/2000  . Smokeless tobacco: Not on file  . Alcohol Use: 7.0 oz/week    14 drink(s) per week     Comment: 2 GLASSES OF WINE DAILY   Family History  Problem Relation Age of Onset  . Heart disease Mother     heart attack  . Hypertension Mother   . Breast cancer Mother     ages 57 and 50  . Hypertension Father   . Hypertension Sister   . Osteopenia Sister   . Heart disease Maternal Grandfather     MI  . Lung cancer Paternal Uncle     heavy smoker   Allergies  Allergen  Reactions  . Boniva (Ibandronic Acid) Nausea Only  . Lisinopril Other (See Comments)    Severe fatigue  . Norvasc (Amlodipine Besylate)     Fatigue/ depression  . Tetanus Toxoid     REACTION: very fatigued for 1 week after   Current Outpatient Prescriptions on File Prior to Visit  Medication Sig Dispense Refill  . Multiple Vitamin (MULTIVITAMIN) capsule One tablet every other day alternating with Vitamin D.        No current facility-administered medications on file prior to visit.    Review of Systems    Review of Systems  Constitutional: Negative for fever, appetite change, fatigue and unexpected weight change.  Eyes: Negative for pain and visual disturbance.  Respiratory: Negative for cough and shortness of breath.   Cardiovascular: Negative for cp or palpitations    Gastrointestinal: Negative for nausea, diarrhea and constipation.  Genitourinary: Negative for urgency and frequency.  Skin: Negative for pallor or rash   Neurological: Negative for weakness, light-headedness, numbness and headaches.  Hematological: Negative for adenopathy. Does not bruise/bleed easily.  Psychiatric/Behavioral: Negative for dysphoric mood. The patient is not nervous/anxious.  Objective:   Physical Exam  Constitutional: She appears well-developed and well-nourished. No distress.  HENT:  Head: Normocephalic and atraumatic.  Mouth/Throat: Oropharynx is clear and moist.  Eyes: Conjunctivae and EOM are normal. Pupils are equal, round, and reactive to light. Right eye exhibits no discharge. Left eye exhibits no discharge. No scleral icterus.  Neck: Normal range of motion. Neck supple. No JVD present. Carotid bruit is not present. No thyromegaly present.  Cardiovascular: Normal rate, regular rhythm, normal heart sounds and intact distal pulses.  Exam reveals no gallop.   Pulmonary/Chest: Effort normal and breath sounds normal. No respiratory distress. She has no wheezes.  Lymphadenopathy:    She  has no cervical adenopathy.  Neurological: She is alert.  Skin: Skin is warm and dry. No pallor.  Psychiatric: She has a normal mood and affect.          Assessment & Plan:

## 2013-02-21 NOTE — Patient Instructions (Addendum)
Blood pressure is good  130/80 on 2nd check Take care of yourself  Follow up in about 6 months for annual exam with labs prior

## 2013-03-23 ENCOUNTER — Ambulatory Visit (INDEPENDENT_AMBULATORY_CARE_PROVIDER_SITE_OTHER): Payer: BC Managed Care – PPO | Admitting: *Deleted

## 2013-03-23 DIAGNOSIS — Z2911 Encounter for prophylactic immunotherapy for respiratory syncytial virus (RSV): Secondary | ICD-10-CM

## 2013-03-23 DIAGNOSIS — Z23 Encounter for immunization: Secondary | ICD-10-CM

## 2013-05-02 ENCOUNTER — Other Ambulatory Visit: Payer: Self-pay | Admitting: Family Medicine

## 2013-05-02 DIAGNOSIS — Z0289 Encounter for other administrative examinations: Secondary | ICD-10-CM

## 2013-05-02 DIAGNOSIS — Z1231 Encounter for screening mammogram for malignant neoplasm of breast: Secondary | ICD-10-CM

## 2013-06-20 ENCOUNTER — Ambulatory Visit (HOSPITAL_COMMUNITY)
Admission: RE | Admit: 2013-06-20 | Discharge: 2013-06-20 | Disposition: A | Discharge status: Home / Self Care | Payer: BC Managed Care – PPO | Source: Ambulatory Visit | Attending: Family Medicine | Admitting: Family Medicine

## 2013-06-20 DIAGNOSIS — Z1231 Encounter for screening mammogram for malignant neoplasm of breast: Secondary | ICD-10-CM | POA: Insufficient documentation

## 2013-06-21 ENCOUNTER — Encounter: Payer: Self-pay | Admitting: *Deleted

## 2013-08-15 ENCOUNTER — Telehealth: Payer: Self-pay | Admitting: Family Medicine

## 2013-08-15 DIAGNOSIS — M810 Age-related osteoporosis without current pathological fracture: Secondary | ICD-10-CM

## 2013-08-15 DIAGNOSIS — I10 Essential (primary) hypertension: Secondary | ICD-10-CM

## 2013-08-15 NOTE — Telephone Encounter (Signed)
Message copied by Judy Pimple on Mon Aug 15, 2013  8:55 AM ------      Message from: Alvina Chou      Created: Fri Aug 05, 2013  4:25 PM      Regarding: Lab orders for, Tuesday 10.7.14       Labs for a 6 month f/u ------

## 2013-08-15 NOTE — Telephone Encounter (Signed)
Message copied by Judy Pimple on Mon Aug 15, 2013  8:56 AM ------      Message from: Alvina Chou      Created: Fri Aug 05, 2013  4:25 PM      Regarding: Lab orders for, Tuesday 10.7.14       Labs for a 6 month f/u ------

## 2013-08-16 ENCOUNTER — Other Ambulatory Visit (INDEPENDENT_AMBULATORY_CARE_PROVIDER_SITE_OTHER): Payer: BC Managed Care – PPO

## 2013-08-16 DIAGNOSIS — I10 Essential (primary) hypertension: Secondary | ICD-10-CM

## 2013-08-16 DIAGNOSIS — M810 Age-related osteoporosis without current pathological fracture: Secondary | ICD-10-CM

## 2013-08-16 LAB — CBC WITH DIFFERENTIAL/PLATELET
Basophils Absolute: 0 10*3/uL (ref 0.0–0.1)
Eosinophils Absolute: 0.6 10*3/uL (ref 0.0–0.7)
HCT: 40.2 % (ref 36.0–46.0)
Lymphs Abs: 2.7 10*3/uL (ref 0.7–4.0)
MCHC: 34 g/dL (ref 30.0–36.0)
MCV: 94.2 fl (ref 78.0–100.0)
Monocytes Absolute: 0.5 10*3/uL (ref 0.1–1.0)
Neutro Abs: 2 10*3/uL (ref 1.4–7.7)
Platelets: 295 10*3/uL (ref 150.0–400.0)
RDW: 13.3 % (ref 11.5–14.6)

## 2013-08-16 LAB — COMPREHENSIVE METABOLIC PANEL
ALT: 14 U/L (ref 0–35)
Alkaline Phosphatase: 52 U/L (ref 39–117)
Creatinine, Ser: 0.7 mg/dL (ref 0.4–1.2)
Sodium: 137 mEq/L (ref 135–145)
Total Bilirubin: 1.5 mg/dL — ABNORMAL HIGH (ref 0.3–1.2)
Total Protein: 7 g/dL (ref 6.0–8.3)

## 2013-08-16 LAB — LIPID PANEL
HDL: 99 mg/dL (ref >39.00)
LDL Cholesterol: 87 mg/dL (ref 0–99)
VLDL: 9.6 mg/dL (ref 0.0–40.0)

## 2013-08-16 LAB — TSH: TSH: 2.22 u[IU]/mL (ref 0.35–5.50)

## 2013-08-17 LAB — VITAMIN D 25 HYDROXY (VIT D DEFICIENCY, FRACTURES): Vit D, 25-Hydroxy: 54 ng/mL (ref 30–89)

## 2013-08-23 ENCOUNTER — Ambulatory Visit: Payer: BC Managed Care – PPO | Admitting: Family Medicine

## 2013-08-26 ENCOUNTER — Encounter: Payer: Self-pay | Admitting: Obstetrics & Gynecology

## 2013-08-26 ENCOUNTER — Ambulatory Visit (INDEPENDENT_AMBULATORY_CARE_PROVIDER_SITE_OTHER): Payer: BC Managed Care – PPO | Admitting: Obstetrics & Gynecology

## 2013-08-26 VITALS — BP 124/84 | HR 68 | Resp 16 | Ht 63.5 in | Wt 116.8 lb

## 2013-08-26 DIAGNOSIS — Z01419 Encounter for gynecological examination (general) (routine) without abnormal findings: Secondary | ICD-10-CM

## 2013-08-26 DIAGNOSIS — Z0000 Encounter for general adult medical examination without abnormal findings: Secondary | ICD-10-CM

## 2013-08-26 DIAGNOSIS — Z124 Encounter for screening for malignant neoplasm of cervix: Secondary | ICD-10-CM

## 2013-08-26 LAB — POCT URINALYSIS DIPSTICK
Bilirubin, UA: NEGATIVE
Blood, UA: NEGATIVE
Glucose, UA: NEGATIVE
Nitrite, UA: NEGATIVE
Urobilinogen, UA: NEGATIVE
pH, UA: 8

## 2013-08-26 NOTE — Progress Notes (Signed)
62 y.o. [id number] Divorced/Partnered CaucasianF here for annual exam.  Menopausal about 10 yrs.  Never on HRT.  H/O osteoporosis.  Last BMD 6/13 with T score -2.9 spine, -2.7 neck of spine.  Was on Fosamax for 5 years (totally).  Took Boniva 2 months and had GI issues.  Former pt of Dr. Verl Dicker, now retired.   Patient's last menstrual period was 11/10/2002.          Sexually active: yes  The current method of family planning is none.    Exercising: yes  running and walking Smoker:  no  Health Maintenance: Pap:  04/10/11 WNL History of abnormal Pap:  no MMG:  06/20/13 normal Colonoscopy:  11/09 repeat in 10 years, Dr. Juanda Chance BMD:   7/13 was on Boniva, stopped secondary GI issues TDaP:  9/12 Screening Labs: PCP, Hb today: PCP, Urine today: PH-8   reports that she quit smoking about 12 years ago. She has never used smokeless tobacco. She reports that she drinks about 7.0 ounces of alcohol per week. She reports that she does not use illicit drugs.  Past Medical History  Diagnosis Date  . Osteoporosis   . Allergy     allergic rhinitis  . Gilbert's syndrome     with high bilirubin  . Hypertension     Past Surgical History  Procedure Laterality Date  . Tonsillectomy and adenoidectomy  1957    Current Outpatient Prescriptions  Medication Sig Dispense Refill  . Cholecalciferol (VITAMIN D) 2000 UNITS tablet Take 2,000 Units by mouth daily.      . Multiple Vitamin (MULTIVITAMIN) capsule One tablet every other day alternating with Vitamin D.       . olmesartan (BENICAR) 20 MG tablet Take 1 tablet (20 mg total) by mouth daily.  30 tablet  6   No current facility-administered medications for this visit.    Family History  Problem Relation Age of Onset  . Heart disease Mother     heart attack  . Hypertension Mother   . Breast cancer Mother     ages 60 and 67  . Hypertension Father   . Hypertension Sister   . Osteopenia Sister   . Heart disease Maternal Grandfather     MI  .  Lung cancer Paternal Uncle     heavy smoker    ROS:  Pertinent items are noted in HPI.  Otherwise, a comprehensive ROS was negative.  Exam:   BP 124/84  Pulse 68  Resp 16  Ht 5' 3.5" (1.613 m)  Wt 116 lb 12.8 oz (52.98 kg)  BMI 20.36 kg/m2  LMP 11/10/2002   Height: 5' 3.5" (161.3 cm)  Ht Readings from Last 3 Encounters:  08/26/13 5' 3.5" (1.613 m)  02/21/13 5' 3.5" (1.613 m)  07/26/12 5' 3.5" (1.613 m)    General appearance: alert, cooperative and appears stated age Head: Normocephalic, without obvious abnormality, atraumatic Neck: no adenopathy, supple, symmetrical, trachea midline and thyroid normal to inspection and palpation Lungs: clear to auscultation bilaterally Breasts: normal appearance, no masses or tenderness Heart: regular rate and rhythm Abdomen: soft, non-tender; bowel sounds normal; no masses,  no organomegaly Extremities: extremities normal, atraumatic, no cyanosis or edema Skin: Skin color, texture, turgor normal. No rashes or lesions Lymph nodes: Cervical, supraclavicular, and axillary nodes normal. No abnormal inguinal nodes palpated Neurologic: Grossly normal   Pelvic: External genitalia:  no lesions              Urethra:  normal appearing urethra with  no masses, tenderness or lesions              Bartholins and Skenes: normal                 Vagina: normal appearing vagina with normal color and discharge, no lesions              Cervix: no lesions              Pap taken: yes Bimanual Exam:  Uterus:  normal size, contour, position, consistency, mobility, non-tender              Adnexa: normal adnexa and no mass, fullness, tenderness               Rectovaginal: Confirms               Anus:  normal sphincter tone, no lesions  A:  Well Woman with normal exam PMP, no HRT Partnered Osteoporosis, h/o fosamax use Family hx of breast cancer in mother (with 2 primaries).  D/w pt genetic testing.  She has seen Dentist.  Risk for pt is low.  She  will consider, again, however.  P:   Mammogram yearly pap smear with HR HPV done today Labs with PCP D/W pt options for treatment of osteoporosis.  I think, given family hx of breast cancer, should consider Evista.  Reclast and Prolia also discussed. return annually or prn  An After Visit Summary was printed and given to the patient.

## 2013-08-26 NOTE — Patient Instructions (Signed)

## 2013-08-31 ENCOUNTER — Ambulatory Visit (INDEPENDENT_AMBULATORY_CARE_PROVIDER_SITE_OTHER): Payer: BC Managed Care – PPO | Admitting: Family Medicine

## 2013-08-31 ENCOUNTER — Encounter: Payer: Self-pay | Admitting: Family Medicine

## 2013-08-31 VITALS — BP 130/80 | HR 73 | Temp 98.2°F | Ht 63.5 in | Wt 114.5 lb

## 2013-08-31 DIAGNOSIS — I10 Essential (primary) hypertension: Secondary | ICD-10-CM

## 2013-08-31 DIAGNOSIS — Z23 Encounter for immunization: Secondary | ICD-10-CM

## 2013-08-31 NOTE — Patient Instructions (Addendum)
Flu vaccine today Make nurse appointment for Tdap vaccine in about 2 weeks  No change in medicines  Take good care of yourself

## 2013-08-31 NOTE — Progress Notes (Signed)
Subjective:    Patient ID: Karen Rice, female    DOB: [birth year], 62 y.o.   MRN: [id number]  HPI Here for f/u of HTN  Feeling ok - and taking care of herself   Saw a new gyn - Corrie Mckusick last week - bp was great 120s/80s , also good in accupuncture clinic Had a pap smear  Disc her bone density test - and will continue to watch - disc opt for tx and she will hold off until her next dexa -not thrilled to take medicine  It was recommended by Dr Hyacinth Meeker to get Braca test - due to family history  Dr Park Breed told her that this Tays be overkill   On benicar- still doing well   Vit D level great 54 tsh nl   bp is stable today  No cp or palpitations or headaches or edema  No side effects to medicines  BP Readings from Last 3 Encounters:  08/31/13 136/94  08/26/13 124/84  02/21/13 130/80     Wt is down 2 lb with bmi of 19  Flu vaccine -wants to get that today  Is also interested in Tdap   Labs Lab Results  Component Value Date   CHOL 196 08/16/2013   CHOL 201* 07/19/2012   CHOL 191 01/03/2011   Lab Results  Component Value Date   HDL 99.00 08/16/2013   HDL 40.98 07/19/2012   HDL 104.70 01/03/2011   Lab Results  Component Value Date   LDLCALC 87 08/16/2013   LDLCALC 80 01/03/2011   Lab Results  Component Value Date   TRIG 48.0 08/16/2013   TRIG 45.0 07/19/2012   TRIG 34.0 01/03/2011   Lab Results  Component Value Date   CHOLHDL 2 08/16/2013   CHOLHDL 2 07/19/2012   CHOLHDL 2 01/03/2011   Lab Results  Component Value Date   LDLDIRECT 94.6 07/19/2012   LDLDIRECT 89.7 12/26/2008    Patient Active Problem List   Diagnosis Date Noted  . Anxiety 04/14/2011  . Hypertension 03/03/2011  . Routine general medical examination at a health care facility 03/03/2011  . DEPRESSION 07/21/2007  . ALLERGIC RHINITIS 07/21/2007  . OSTEOPOROSIS 07/21/2007   Past Medical History  Diagnosis Date  . Osteoporosis   . Allergy     allergic rhinitis  . Gilbert's syndrome     with high bilirubin   . Hypertension    Past Surgical History  Procedure Laterality Date  . Tonsillectomy and adenoidectomy  1957   History  Substance Use Topics  . Smoking status: Former Smoker    Quit date: 11/10/2000  . Smokeless tobacco: Never Used  . Alcohol Use: 7.0 oz/week    14 drink(s) per week     Comment: 2 GLASSES OF WINE DAILY   Family History  Problem Relation Age of Onset  . Heart disease Mother     heart attack  . Hypertension Mother   . Breast cancer Mother     ages 72 and 59  . Hypertension Father   . Hypertension Sister   . Osteopenia Sister   . Heart disease Maternal Grandfather     MI  . Lung cancer Paternal Uncle     heavy smoker   Allergies  Allergen Reactions  . Boniva [Ibandronic Acid] Nausea Only  . Lisinopril Other (See Comments)    Severe fatigue  . Norvasc [Amlodipine Besylate]     Fatigue/ depression  . Tetanus Toxoid     REACTION: very fatigued for 1  week after   Current Outpatient Prescriptions on File Prior to Visit  Medication Sig Dispense Refill  . Cholecalciferol (VITAMIN D) 2000 UNITS tablet Take 2,000 Units by mouth daily.      . Multiple Vitamin (MULTIVITAMIN) capsule Take 1 capsule by mouth daily.       Marland Kitchen olmesartan (BENICAR) 20 MG tablet Take 1 tablet (20 mg total) by mouth daily.  30 tablet  6   No current facility-administered medications on file prior to visit.    Review of Systems Review of Systems  Constitutional: Negative for fever, appetite change, fatigue and unexpected weight change.  Eyes: Negative for pain and visual disturbance.  Respiratory: Negative for cough and shortness of breath.   Cardiovascular: Negative for cp or palpitations    Gastrointestinal: Negative for nausea, diarrhea and constipation.  Genitourinary: Negative for urgency and frequency.  Skin: Negative for pallor or rash   Neurological: Negative for weakness, light-headedness, numbness and headaches.  Hematological: Negative for adenopathy. Does not  bruise/bleed easily.  Psychiatric/Behavioral: Negative for dysphoric mood. The patient is not nervous/anxious.         Objective:   Physical Exam  Constitutional: She appears well-developed and well-nourished. No distress.  Slim and well appearing   HENT:  Head: Normocephalic and atraumatic.  Right Ear: External ear normal.  Left Ear: External ear normal.  Nose: Nose normal.  Mouth/Throat: Oropharynx is clear and moist.  Eyes: Conjunctivae and EOM are normal. Pupils are equal, round, and reactive to light. Right eye exhibits no discharge. Left eye exhibits no discharge. No scleral icterus.  Neck: Normal range of motion. Neck supple. No JVD present. No thyromegaly present.  Cardiovascular: Normal rate, regular rhythm, normal heart sounds and intact distal pulses.  Exam reveals no gallop.   Pulmonary/Chest: Effort normal and breath sounds normal. No respiratory distress. She has no wheezes. She has no rales.  Abdominal: Soft. Bowel sounds are normal. She exhibits no distension and no mass. There is no tenderness.  Musculoskeletal: She exhibits no edema and no tenderness.  Lymphadenopathy:    She has no cervical adenopathy.  Neurological: She is alert. She has normal reflexes. No cranial nerve deficit. She exhibits normal muscle tone. Coordination normal.  Skin: Skin is warm and dry. No rash noted. No erythema. No pallor.  Psychiatric: She has a normal mood and affect.          Assessment & Plan:

## 2013-09-01 NOTE — Assessment & Plan Note (Addendum)
Doing well with benicar-no side eff bp in fair control at this time (better on 2nd check) No changes needed  Disc lifstyle change with low sodium diet and exercise Labs reviewed

## 2013-09-15 ENCOUNTER — Ambulatory Visit (INDEPENDENT_AMBULATORY_CARE_PROVIDER_SITE_OTHER): Payer: BC Managed Care – PPO

## 2013-09-15 DIAGNOSIS — Z23 Encounter for immunization: Secondary | ICD-10-CM

## 2013-10-25 ENCOUNTER — Other Ambulatory Visit: Payer: Self-pay | Admitting: Family Medicine

## 2014-03-24 ENCOUNTER — Other Ambulatory Visit: Payer: Self-pay | Admitting: Family Medicine

## 2014-03-24 NOTE — Telephone Encounter (Signed)
Electronic refill request, please advise  

## 2014-03-24 NOTE — Telephone Encounter (Signed)
Please refill for 6 months 

## 2014-05-17 ENCOUNTER — Other Ambulatory Visit: Payer: Self-pay | Admitting: Obstetrics & Gynecology

## 2014-05-17 DIAGNOSIS — Z1231 Encounter for screening mammogram for malignant neoplasm of breast: Secondary | ICD-10-CM

## 2014-05-24 ENCOUNTER — Telehealth: Payer: Self-pay | Admitting: Obstetrics & Gynecology

## 2014-05-24 NOTE — Telephone Encounter (Signed)
Pt wants to talk with the nurse about some vitamins. Pt came in and left some information she wants to discuss with the nurse.

## 2014-05-24 NOTE — Telephone Encounter (Signed)
Her Vit D level was 54 in October, so 4000 IU of Vit D is too much.  Really only needs about 1000.  Vit K can change bleeding and increase clotting so I wouldn't take that at all.  Please advise pt.  Thanks.

## 2014-05-24 NOTE — Telephone Encounter (Signed)
Patient calling with questions regarding changing vitamin D3 supplements. She is taking a multivitamin with D3 1000 iu and now adding a liquid supplement that has 3000 iu of Vitamin D3, 588 iu of Vitamin A and 80 mcg of Vitamin K2. Patient wondering if this is too much Vitamin D3 and Dr. Ammie Ferrier opinion regarding Vitamin K2 and bone health. Patient does not want to start any new oral treatment for osteoporosis at this time.  Advised of recommendations for calcium and Vitamin D, 1200 mg of calcium and 800 international units of vitamin D daily for postmenopausal women with osteoporosis. Patient verbalized understanding but would like Dr. Sanjuan Dame opinion on D3 and K2.  Advised would send message and return call with response.

## 2014-05-26 NOTE — Telephone Encounter (Signed)
Returning a call to Amanda.

## 2014-05-26 NOTE — Telephone Encounter (Signed)
LMOVM at [phone] and [phone] asking patient to call me back.

## 2014-05-26 NOTE — Telephone Encounter (Signed)
Returned patient's call to [phone] LMOVM to call me back.

## 2014-05-29 NOTE — Telephone Encounter (Signed)
Patient stopped by asking to speak with amanda. Advised she was with patients but i would se if i could get a triage nurse to speak with her. Patient declined said that she would just wait for amanda to call or she would call her. Said that she would be home after 4 pm today

## 2014-06-01 NOTE — Telephone Encounter (Signed)
Patient notified of Dr. Ammie Ferrier recommendations.

## 2014-06-21 ENCOUNTER — Ambulatory Visit (HOSPITAL_COMMUNITY): Payer: BC Managed Care – PPO

## 2014-07-05 ENCOUNTER — Ambulatory Visit (HOSPITAL_COMMUNITY)
Admission: RE | Admit: 2014-07-05 | Discharge: 2014-07-05 | Disposition: A | Discharge status: Home / Self Care | Payer: BC Managed Care – PPO | Source: Ambulatory Visit | Attending: Obstetrics & Gynecology | Admitting: Obstetrics & Gynecology

## 2014-07-05 DIAGNOSIS — Z1231 Encounter for screening mammogram for malignant neoplasm of breast: Secondary | ICD-10-CM | POA: Diagnosis present

## 2014-09-03 ENCOUNTER — Telehealth: Payer: Self-pay | Admitting: Family Medicine

## 2014-09-03 DIAGNOSIS — M810 Age-related osteoporosis without current pathological fracture: Secondary | ICD-10-CM

## 2014-09-03 DIAGNOSIS — Z0000 Encounter for general adult medical examination without abnormal findings: Secondary | ICD-10-CM

## 2014-09-03 NOTE — Telephone Encounter (Signed)
Message copied by Abner Greenspan on Sun Sep 03, 2014 10:46 AM ------      Message from: Ellamae Sia      Created: Wed Aug 30, 2014  4:12 PM      Regarding: Lab orders for Monday, 10.26.15       Patient is scheduled for CPX labs, please order future labs, Thanks , Terri       ------

## 2014-09-04 ENCOUNTER — Other Ambulatory Visit (INDEPENDENT_AMBULATORY_CARE_PROVIDER_SITE_OTHER): Payer: BC Managed Care – PPO

## 2014-09-04 DIAGNOSIS — M810 Age-related osteoporosis without current pathological fracture: Secondary | ICD-10-CM

## 2014-09-04 DIAGNOSIS — Z0000 Encounter for general adult medical examination without abnormal findings: Secondary | ICD-10-CM

## 2014-09-04 DIAGNOSIS — I10 Essential (primary) hypertension: Secondary | ICD-10-CM

## 2014-09-04 LAB — CBC WITH DIFFERENTIAL/PLATELET
BASOS ABS: 0 10*3/uL (ref 0.0–0.1)
Basophils Relative: 0.8 % (ref 0.0–3.0)
Eosinophils Absolute: 0.5 10*3/uL (ref 0.0–0.7)
Eosinophils Relative: 9.7 % — ABNORMAL HIGH (ref 0.0–5.0)
HCT: 39.6 % (ref 36.0–46.0)
HEMOGLOBIN: 13.3 g/dL (ref 12.0–15.0)
LYMPHS PCT: 40.2 % (ref 12.0–46.0)
Lymphs Abs: 2.3 10*3/uL (ref 0.7–4.0)
MCHC: 33.5 g/dL (ref 30.0–36.0)
MCV: 94.1 fl (ref 78.0–100.0)
MONOS PCT: 8.2 % (ref 3.0–12.0)
Monocytes Absolute: 0.5 10*3/uL (ref 0.1–1.0)
Neutro Abs: 2.3 10*3/uL (ref 1.4–7.7)
Neutrophils Relative %: 41.1 % — ABNORMAL LOW (ref 43.0–77.0)
PLATELETS: 290 10*3/uL (ref 150.0–400.0)
RBC: 4.21 Mil/uL (ref 3.87–5.11)
RDW: 13.7 % (ref 11.5–15.5)
WBC: 5.6 10*3/uL (ref 4.0–10.5)

## 2014-09-04 LAB — LIPID PANEL
Cholesterol: 199 mg/dL (ref 0–200)
HDL: 97.6 mg/dL (ref >39.00)
LDL Cholesterol: 93 mg/dL (ref 0–99)
NONHDL: 101.4
Total CHOL/HDL Ratio: 2
Triglycerides: 40 mg/dL (ref 0.0–149.0)
VLDL: 8 mg/dL (ref 0.0–40.0)

## 2014-09-04 LAB — COMPREHENSIVE METABOLIC PANEL
ALT: 17 U/L (ref 0–35)
AST: 23 U/L (ref 0–37)
Albumin: 4 g/dL (ref 3.5–5.2)
Alkaline Phosphatase: 55 U/L (ref 39–117)
BUN: 15 mg/dL (ref 6–23)
CALCIUM: 9.7 mg/dL (ref 8.4–10.5)
CHLORIDE: 102 meq/L (ref 96–112)
CO2: 29 meq/L (ref 19–32)
Creatinine, Ser: 0.7 mg/dL (ref 0.4–1.2)
GFR: 94.45 mL/min (ref >60.00)
Glucose, Bld: 95 mg/dL (ref 70–99)
POTASSIUM: 4 meq/L (ref 3.5–5.1)
SODIUM: 137 meq/L (ref 135–145)
TOTAL PROTEIN: 7.3 g/dL (ref 6.0–8.3)
Total Bilirubin: 2 mg/dL — ABNORMAL HIGH (ref 0.2–1.2)

## 2014-09-04 LAB — VITAMIN D 25 HYDROXY (VIT D DEFICIENCY, FRACTURES): VITD: 47.76 ng/mL (ref 30.00–100.00)

## 2014-09-04 LAB — TSH: TSH: 3.64 u[IU]/mL (ref 0.35–4.50)

## 2014-09-08 ENCOUNTER — Ambulatory Visit (INDEPENDENT_AMBULATORY_CARE_PROVIDER_SITE_OTHER): Payer: BC Managed Care – PPO | Admitting: Family Medicine

## 2014-09-08 ENCOUNTER — Encounter: Payer: Self-pay | Admitting: Family Medicine

## 2014-09-08 VITALS — BP 135/80 | HR 87 | Temp 98.0°F | Ht 63.5 in | Wt 114.0 lb

## 2014-09-08 DIAGNOSIS — Z23 Encounter for immunization: Secondary | ICD-10-CM

## 2014-09-08 DIAGNOSIS — M810 Age-related osteoporosis without current pathological fracture: Secondary | ICD-10-CM

## 2014-09-08 DIAGNOSIS — Z0000 Encounter for general adult medical examination without abnormal findings: Secondary | ICD-10-CM

## 2014-09-08 DIAGNOSIS — I10 Essential (primary) hypertension: Secondary | ICD-10-CM

## 2014-09-08 MED ORDER — OLMESARTAN MEDOXOMIL 20 MG PO TABS: ORAL_TABLET | ORAL | Status: DC

## 2014-09-08 NOTE — Assessment & Plan Note (Signed)
Reviewed health habits including diet and exercise and skin cancer prevention Reviewed appropriate screening tests for age  Also reviewed health mt list, fam hx and immunization status , as well as social and family history   See HPI Labs reviewed  Flu vaccine today No falls

## 2014-09-08 NOTE — Assessment & Plan Note (Signed)
bp in fair control at this time  BP Readings from Last 1 Encounters:  09/08/14 135/80   No changes needed Disc lifstyle change with low sodium diet and exercise  Better on 2nd check  Labs rev

## 2014-09-08 NOTE — Patient Instructions (Signed)
Labs look good and stable  Flu vaccine today  Keep taking good care of yourself

## 2014-09-08 NOTE — Progress Notes (Signed)
Subjective:    Patient ID: Karen Rice, female    DOB: 1951-01-02, 63 y.o.   MRN: [id number]  HPI Here for health maintenance exam and to review chronic medical problems    Doing well / feels really good   Had 3 sinus infections in the past year - her ENT recommends allergy testing  Also had an episode of vertigo this summer-it was brief  Is interested in seeing Jessee Avers - she will make her own appt   Wt is stable with bmi of 19.8  bp is up a bit  today (nl at home 120/78 yesterday)- always some whitecoat  No cp or palpitations or headaches or edema  No side effects to medicines  BP Readings from Last 3 Encounters:  09/08/14 142/82  08/31/13 130/80  08/26/13 124/84     Flu vaccine - needs it today Td 11/14 Zoster vaccine 5/14  Mammogram 8/15 nl  Self exam-no new lumps or changes  The 3D test was recommended   Gyn pap 10/14 nl - has upcoming appt next week   OP- dexa 7/13 - will get that ref from gyn  Followed by gyn D level is 47   Hx of Gilbert's - has bilirubin of 2.0  Cholesterol is excellent Lab Results  Component Value Date   CHOL 199 09/04/2014   HDL 97.60 09/04/2014   LDLCALC 93 09/04/2014   LDLDIRECT 94.6 07/19/2012   TRIG 40.0 09/04/2014   CHOLHDL 2 09/04/2014    Patient Active Problem List   Diagnosis Date Noted  . Anxiety 04/14/2011  . Hypertension 03/03/2011  . Routine general medical examination at a health care facility 03/03/2011  . DEPRESSION 07/21/2007  . ALLERGIC RHINITIS 07/21/2007  . Osteoporosis 07/21/2007   Past Medical History  Diagnosis Date  . Osteoporosis   . Allergy     allergic rhinitis  . Gilbert's syndrome     with high bilirubin  . Hypertension    Past Surgical History  Procedure Laterality Date  . Tonsillectomy and adenoidectomy  1957   History  Substance Use Topics  . Smoking status: Former Smoker    Quit date: 11/10/2000  . Smokeless tobacco: Never Used  . Alcohol Use: 7.0 oz/week    14 drink(s) per week       Comment: 2 GLASSES OF WINE DAILY   Family History  Problem Relation Age of Onset  . Heart disease Mother     heart attack  . Hypertension Mother   . Breast cancer Mother     ages 16 and 57  . Hypertension Father   . Hypertension Sister   . Osteopenia Sister   . Heart disease Maternal Grandfather     MI  . Lung cancer Paternal Uncle     heavy smoker   Allergies  Allergen Reactions  . Boniva [Ibandronic Acid] Nausea Only  . Lisinopril Other (See Comments)    Severe fatigue  . Norvasc [Amlodipine Besylate]     Fatigue/ depression  . Tetanus Toxoid     REACTION: very fatigued for 1 week after   Current Outpatient Prescriptions on File Prior to Visit  Medication Sig Dispense Refill  . BENICAR 20 MG tablet TAKE ONE TABLET BY MOUTH EVERY DAY  30 tablet  5  . Cholecalciferol (VITAMIN D) 2000 UNITS tablet Take 2,000 Units by mouth daily.      . Multiple Vitamin (MULTIVITAMIN) capsule Take 1 capsule by mouth daily.        No  current facility-administered medications on file prior to visit.    Review of Systems    Review of Systems  Constitutional: Negative for fever, appetite change, fatigue and unexpected weight change.  Eyes: Negative for pain and visual disturbance.  ENT pos for cong/ rhinorrhea and frequent sinus infections  Respiratory: Negative for cough and shortness of breath.   Cardiovascular: Negative for cp or palpitations    Gastrointestinal: Negative for nausea, diarrhea and constipation.  Genitourinary: Negative for urgency and frequency.  Skin: Negative for pallor or rash   Neurological: Negative for weakness, light-headedness, numbness and headaches.  Hematological: Negative for adenopathy. Does not bruise/bleed easily.  Psychiatric/Behavioral: Negative for dysphoric mood. The patient is not nervous/anxious.      Objective:   Physical Exam  Constitutional: She appears well-developed and well-nourished. No distress.  HENT:  Head: Normocephalic and  atraumatic.  Right Ear: External ear normal.  Left Ear: External ear normal.  Nose: Nose normal.  Mouth/Throat: Oropharynx is clear and moist.  Nares are boggy  Eyes: Conjunctivae and EOM are normal. Pupils are equal, round, and reactive to light. Right eye exhibits no discharge. Left eye exhibits no discharge. No scleral icterus.  Neck: Normal range of motion. Neck supple. No JVD present. No thyromegaly present.  Cardiovascular: Normal rate, regular rhythm, normal heart sounds and intact distal pulses.  Exam reveals no gallop.   Pulmonary/Chest: Effort normal and breath sounds normal. No respiratory distress. She has no wheezes. She has no rales.  Abdominal: Soft. Bowel sounds are normal. She exhibits no distension and no mass. There is no tenderness.  Musculoskeletal: She exhibits no edema or tenderness.  No kyphosis   Lymphadenopathy:    She has no cervical adenopathy.  Neurological: She is alert. She has normal reflexes. No cranial nerve deficit. She exhibits normal muscle tone. Coordination normal.  Skin: Skin is warm and dry. No rash noted. No erythema. No pallor.  Psychiatric: She has a normal mood and affect.          Assessment & Plan:   Problem List Items Addressed This Visit      Unprioritized   Hypertension - Primary    bp in fair control at this time  BP Readings from Last 1 Encounters:  09/08/14 135/80   No changes needed Disc lifstyle change with low sodium diet and exercise  Better on 2nd check  Labs rev    Relevant Medications      olmesartan (BENICAR) tablet   Osteoporosis   Routine general medical examination at a health care facility    Reviewed health habits including diet and exercise and skin cancer prevention Reviewed appropriate screening tests for age  Also reviewed health mt list, fam hx and immunization status , as well as social and family history   See HPI Labs reviewed  Flu vaccine today No falls      Other Visit Diagnoses    Need  for prophylactic vaccination and inoculation against influenza        Relevant Orders       Flu Vaccine QUAD 36+ mos PF IM (Fluarix Quad PF) (Completed)

## 2014-09-08 NOTE — Progress Notes (Signed)
Pre visit review using our clinic review tool, if applicable. No additional management support is needed unless otherwise documented below in the visit note. 

## 2014-09-11 ENCOUNTER — Telehealth: Payer: Self-pay | Admitting: Family Medicine

## 2014-09-11 ENCOUNTER — Encounter: Payer: Self-pay | Admitting: Family Medicine

## 2014-09-11 NOTE — Telephone Encounter (Signed)
emmi emailed °

## 2014-09-19 ENCOUNTER — Ambulatory Visit: Payer: BC Managed Care – PPO | Admitting: Obstetrics & Gynecology

## 2015-03-30 ENCOUNTER — Encounter: Payer: Self-pay | Admitting: Obstetrics & Gynecology

## 2015-03-30 ENCOUNTER — Ambulatory Visit (INDEPENDENT_AMBULATORY_CARE_PROVIDER_SITE_OTHER): Payer: BLUE CROSS/BLUE SHIELD | Admitting: Obstetrics & Gynecology

## 2015-03-30 VITALS — BP 148/88 | HR 72 | Resp 16 | Ht 63.5 in | Wt 115.8 lb

## 2015-03-30 DIAGNOSIS — Z0000 Encounter for general adult medical examination without abnormal findings: Secondary | ICD-10-CM

## 2015-03-30 DIAGNOSIS — Z124 Encounter for screening for malignant neoplasm of cervix: Secondary | ICD-10-CM

## 2015-03-30 DIAGNOSIS — Z01419 Encounter for gynecological examination (general) (routine) without abnormal findings: Secondary | ICD-10-CM | POA: Diagnosis not present

## 2015-03-30 DIAGNOSIS — M810 Age-related osteoporosis without current pathological fracture: Secondary | ICD-10-CM | POA: Diagnosis not present

## 2015-03-30 LAB — POCT URINALYSIS DIPSTICK
Bilirubin, UA: NEGATIVE
Blood, UA: NEGATIVE
Glucose, UA: NEGATIVE
Ketones, UA: NEGATIVE
Leukocytes, UA: NEGATIVE
Nitrite, UA: NEGATIVE
Protein, UA: NEGATIVE
Urobilinogen, UA: NEGATIVE
pH, UA: 7.5

## 2015-03-30 NOTE — Progress Notes (Signed)
64 y.o. [id number] DivorcedCaucasianF here for annual exam.  Doing well.  No vaginal bleeding.    PCP:  Dr. Glori Bickers  Patient's last menstrual period was 11/10/2002.          Sexually active: Yes.    The current method of family planning is post menopausal status.    Exercising: Yes.    fast walking-4 miles daily Smoker:  Former smoker  Health Maintenance: Pap:  08/29/13 WNL/negative HR HPV History of abnormal Pap:  no MMG:  07/05/14-normal Colonoscopy:  2009-repeat in 10 years BMD:   7/13-stopped Boniva secondary GI issues TDaP:  2014 Screening Labs: PCP, Hb today: PCP, Urine today: PH-7.5   reports that she quit smoking about 14 years ago. She has never used smokeless tobacco. She reports that she drinks alcohol. She reports that she does not use illicit drugs.  Past Medical History  Diagnosis Date  . Osteoporosis   . Allergy     allergic rhinitis  . Gilbert's syndrome     with high bilirubin  . Hypertension     Past Surgical History  Procedure Laterality Date  . Tonsillectomy and adenoidectomy  1957    Current Outpatient Prescriptions  Medication Sig Dispense Refill  . Cholecalciferol (VITAMIN D) 2000 UNITS tablet Take 2,000 Units by mouth daily.    . fexofenadine (ALLEGRA ALLERGY CHILDRENS) 30 MG tablet Take 15 mg by mouth daily as needed.    . Magnesium 100 MG TABS Take by mouth.    . Multiple Vitamin (MULTIVITAMIN) capsule Take 1 capsule by mouth daily.     Marland Kitchen olmesartan (BENICAR) 20 MG tablet TAKE ONE TABLET BY MOUTH EVERY DAY 30 tablet 11  . vitamin C (ASCORBIC ACID) 500 MG tablet Take 500 mg by mouth daily. As needed     No current facility-administered medications for this visit.    Family History  Problem Relation Age of Onset  . Heart disease Mother     heart attack  . Hypertension Mother   . Breast cancer Mother     ages 30 and 21  . Hypertension Father   . Hypertension Sister   . Osteopenia Sister   . Heart disease Maternal Grandfather     MI  . Lung  cancer Paternal Uncle     heavy smoker    ROS:  Pertinent items are noted in HPI.  Otherwise, a comprehensive ROS was negative.  Exam:   BP 148/88 mmHg  Pulse 72  Resp 16  Ht 5' 3.5" (1.613 m)  Wt 115 lb 12.8 oz (52.527 kg)  BMI 20.19 kg/m2  LMP 11/10/2002   Height: 5' 3.5" (161.3 cm)  Ht Readings from Last 3 Encounters:  03/30/15 5' 3.5" (1.613 m)  09/08/14 5' 3.5" (1.613 m)  08/31/13 5' 3.5" (1.613 m)    General appearance: alert, cooperative and appears stated age Head: Normocephalic, without obvious abnormality, atraumatic Neck: no adenopathy, supple, symmetrical, trachea midline and thyroid normal to inspection and palpation Lungs: clear to auscultation bilaterally Breasts: normal appearance, no masses or tenderness Heart: regular rate and rhythm Abdomen: soft, non-tender; bowel sounds normal; no masses,  no organomegaly Extremities: extremities normal, atraumatic, no cyanosis or edema Skin: Skin color, texture, turgor normal. No rashes or lesions Lymph nodes: Cervical, supraclavicular, and axillary nodes normal. No abnormal inguinal nodes palpated Neurologic: Grossly normal   Pelvic: External genitalia:  no lesions              Urethra:  normal appearing urethra with no masses,  tenderness or lesions              Bartholins and Skenes: normal                 Vagina: normal appearing vagina with normal color and discharge, no lesions              Cervix: no lesions              Pap taken: No. Bimanual Exam:  Uterus:  normal size, contour, position, consistency, mobility, non-tender              Adnexa: normal adnexa and no mass, fullness, tenderness               Rectovaginal: Confirms               Anus:  normal sphincter tone, no lesions  Chaperone was present for exam.  A:  Well Woman with normal exam PMP, no HRT Partnered Osteoporosis, h/o fosamax use Family hx of breast cancer in mother (with 2 primaries). D/w pt genetic testing. She has seen Engineer, mining. Risk for pt is low. She will consider, again, however. Sees dermatology (Dr. Derrel Nip) yearly  P: Mammogram yearly  Recommended yearly 3D MMG for pt No pap today.  Neg pap with neg HR HPV 2014 Labs with PCP Needs BMD.  Order placed. Return annually or prn

## 2015-05-23 ENCOUNTER — Telehealth: Payer: Self-pay | Admitting: Obstetrics & Gynecology

## 2015-05-23 NOTE — Telephone Encounter (Signed)
Patient called to confirm location of scheduled BMD. She wants to reschedule BMD to same time as her MMG in September. Patient has previously had these done in previous GYN office.  No appointment scheduled according to [facility] appointment desk. Given phone number to [facility] mammography and she will call them. Advised to call back if any additional assistance needed in scheduling.  Routing to provider for final review. Patient agreeable to disposition. Will close encounter.

## 2015-05-23 NOTE — Telephone Encounter (Signed)
Patient is calling to talk with Dr.Miller;s nurse regarding a BMD that was scheduled for her. Last seen 03/30/15.

## 2015-06-08 ENCOUNTER — Other Ambulatory Visit: Payer: Self-pay | Admitting: Obstetrics & Gynecology

## 2015-06-08 DIAGNOSIS — Z1231 Encounter for screening mammogram for malignant neoplasm of breast: Secondary | ICD-10-CM

## 2015-07-09 ENCOUNTER — Ambulatory Visit (HOSPITAL_COMMUNITY)
Admission: RE | Admit: 2015-07-09 | Discharge: 2015-07-09 | Disposition: A | Discharge status: Home / Self Care | Payer: BLUE CROSS/BLUE SHIELD | Source: Ambulatory Visit | Attending: Obstetrics & Gynecology | Admitting: Obstetrics & Gynecology

## 2015-07-09 DIAGNOSIS — Z1231 Encounter for screening mammogram for malignant neoplasm of breast: Secondary | ICD-10-CM | POA: Insufficient documentation

## 2015-09-03 ENCOUNTER — Telehealth: Payer: Self-pay | Admitting: Family Medicine

## 2015-09-03 ENCOUNTER — Other Ambulatory Visit: Payer: BC Managed Care – PPO

## 2015-09-03 DIAGNOSIS — Z0000 Encounter for general adult medical examination without abnormal findings: Secondary | ICD-10-CM

## 2015-09-03 NOTE — Telephone Encounter (Signed)
-----   Message from Ellamae Sia sent at 08/28/2015  3:10 PM EDT ----- Regarding: Lab orders for Tuesday, 10.25.16 Patient is scheduled for CPX labs, please order future labs, Thanks , Karna Christmas

## 2015-09-04 ENCOUNTER — Other Ambulatory Visit (INDEPENDENT_AMBULATORY_CARE_PROVIDER_SITE_OTHER): Payer: BLUE CROSS/BLUE SHIELD

## 2015-09-04 DIAGNOSIS — Z0000 Encounter for general adult medical examination without abnormal findings: Secondary | ICD-10-CM

## 2015-09-04 LAB — LIPID PANEL
Cholesterol: 202 mg/dL — ABNORMAL HIGH (ref 0–200)
HDL: 98.5 mg/dL (ref >39.00)
LDL CALC: 93 mg/dL (ref 0–99)
NONHDL: 103.66
Total CHOL/HDL Ratio: 2
Triglycerides: 51 mg/dL (ref 0.0–149.0)
VLDL: 10.2 mg/dL (ref 0.0–40.0)

## 2015-09-04 LAB — COMPREHENSIVE METABOLIC PANEL
ALT: 16 U/L (ref 0–35)
AST: 21 U/L (ref 0–37)
Albumin: 4.4 g/dL (ref 3.5–5.2)
Alkaline Phosphatase: 62 U/L (ref 39–117)
BUN: 12 mg/dL (ref 6–23)
CALCIUM: 10.1 mg/dL (ref 8.4–10.5)
CO2: 29 meq/L (ref 19–32)
Chloride: 101 mEq/L (ref 96–112)
Creatinine, Ser: 0.74 mg/dL (ref 0.40–1.20)
GFR: 83.95 mL/min (ref >60.00)
GLUCOSE: 99 mg/dL (ref 70–99)
Potassium: 4.4 mEq/L (ref 3.5–5.1)
Sodium: 137 mEq/L (ref 135–145)
TOTAL PROTEIN: 7.1 g/dL (ref 6.0–8.3)
Total Bilirubin: 1.7 mg/dL — ABNORMAL HIGH (ref 0.2–1.2)

## 2015-09-04 LAB — CBC WITH DIFFERENTIAL/PLATELET
BASOS ABS: 0.1 10*3/uL (ref 0.0–0.1)
Basophils Relative: 0.9 % (ref 0.0–3.0)
Eosinophils Absolute: 0.4 10*3/uL (ref 0.0–0.7)
Eosinophils Relative: 6.6 % — ABNORMAL HIGH (ref 0.0–5.0)
HEMATOCRIT: 41.3 % (ref 36.0–46.0)
HEMOGLOBIN: 13.9 g/dL (ref 12.0–15.0)
LYMPHS PCT: 36 % (ref 12.0–46.0)
Lymphs Abs: 2.1 10*3/uL (ref 0.7–4.0)
MCHC: 33.6 g/dL (ref 30.0–36.0)
MCV: 93.3 fl (ref 78.0–100.0)
MONOS PCT: 8 % (ref 3.0–12.0)
Monocytes Absolute: 0.5 10*3/uL (ref 0.1–1.0)
Neutro Abs: 2.9 10*3/uL (ref 1.4–7.7)
Neutrophils Relative %: 48.5 % (ref 43.0–77.0)
Platelets: 291 10*3/uL (ref 150.0–400.0)
RBC: 4.42 Mil/uL (ref 3.87–5.11)
RDW: 13.7 % (ref 11.5–15.5)
WBC: 5.9 10*3/uL (ref 4.0–10.5)

## 2015-09-04 LAB — TSH: TSH: 2.44 u[IU]/mL (ref 0.35–4.50)

## 2015-09-10 ENCOUNTER — Encounter: Payer: BC Managed Care – PPO | Admitting: Family Medicine

## 2015-09-11 ENCOUNTER — Encounter: Payer: Self-pay | Admitting: Family Medicine

## 2015-09-18 ENCOUNTER — Encounter: Payer: Self-pay | Admitting: Family Medicine

## 2015-09-18 ENCOUNTER — Ambulatory Visit (INDEPENDENT_AMBULATORY_CARE_PROVIDER_SITE_OTHER): Payer: BLUE CROSS/BLUE SHIELD | Admitting: Family Medicine

## 2015-09-18 ENCOUNTER — Encounter (INDEPENDENT_AMBULATORY_CARE_PROVIDER_SITE_OTHER): Payer: Self-pay

## 2015-09-18 VITALS — BP 135/80 | HR 78 | Temp 98.0°F | Ht 63.5 in | Wt 115.0 lb

## 2015-09-18 DIAGNOSIS — E2839 Other primary ovarian failure: Secondary | ICD-10-CM | POA: Insufficient documentation

## 2015-09-18 DIAGNOSIS — M810 Age-related osteoporosis without current pathological fracture: Secondary | ICD-10-CM

## 2015-09-18 DIAGNOSIS — Z23 Encounter for immunization: Secondary | ICD-10-CM

## 2015-09-18 DIAGNOSIS — Z0000 Encounter for general adult medical examination without abnormal findings: Secondary | ICD-10-CM

## 2015-09-18 DIAGNOSIS — I10 Essential (primary) hypertension: Secondary | ICD-10-CM

## 2015-09-18 MED ORDER — OLMESARTAN MEDOXOMIL 20 MG PO TABS: ORAL_TABLET | ORAL | Status: DC

## 2015-09-18 NOTE — Progress Notes (Signed)
Subjective:    Patient ID: Karen Rice, female    DOB: Sep 10, 1951, 64 y.o.   MRN: [id number]  HPI Here for health maintenance exam and to review chronic medical problems    Is doing well  Nothing new going on   Wt is stable - eats well and exercises  bmi of 20 She started running again   C/HIV screen-not high risk /declines   Flu shot- will get today   Mm 8/16 nl  Self exam- no lumps    Pap 10/14 neg with neg HPV -gyn Sees Dr Sabra Heck - gyn  No gyn problems   bp is stable today  No cp or palpitations or headaches or edema  No side effects to medicines  BP Readings from Last 3 Encounters:  09/18/15 142/90  03/30/15 148/88  09/08/14 135/80    On benicar   colonosc 11/09  Td 11/14  Zoster vaccine 5/14  dexa 7/13- OP of spine , openia of hip  She has one planned for early in 2017 - wants to do at [facility]  No falls or fractures  Was tx with boniva by her gyn and she had to stop it - made her very nauseated  Last D level was 47- takes ca and D (takes  2000 iu of D)  Posture is good   Gilberts syndrome Bili is 1.7- stable   Lipids Lab Results  Component Value Date   CHOL 202* 09/04/2015   HDL 98.50 09/04/2015   LDLCALC 93 09/04/2015   LDLDIRECT 94.6 07/19/2012   TRIG 51.0 09/04/2015   CHOLHDL 2 09/04/2015    Great profile - very good HDL   Results for orders placed or performed in visit on 09/04/15  CBC with Differential/Platelet  Result Value Ref Range   WBC 5.9 4.0 - 10.5 K/uL   RBC 4.42 3.87 - 5.11 Mil/uL   Hemoglobin 13.9 12.0 - 15.0 g/dL   HCT 41.3 36.0 - 46.0 %   MCV 93.3 78.0 - 100.0 fl   MCHC 33.6 30.0 - 36.0 g/dL   RDW 13.7 11.5 - 15.5 %   Platelets 291.0 150.0 - 400.0 K/uL   Neutrophils Relative % 48.5 43.0 - 77.0 %   Lymphocytes Relative 36.0 12.0 - 46.0 %   Monocytes Relative 8.0 3.0 - 12.0 %   Eosinophils Relative 6.6 (H) 0.0 - 5.0 %   Basophils Relative 0.9 0.0 - 3.0 %   Neutro Abs 2.9 1.4 - 7.7 K/uL   Lymphs Abs 2.1 0.7 - 4.0 K/uL   Monocytes Absolute 0.5 0.1 - 1.0 K/uL   Eosinophils Absolute 0.4 0.0 - 0.7 K/uL   Basophils Absolute 0.1 0.0 - 0.1 K/uL  Comprehensive metabolic panel  Result Value Ref Range   Sodium 137 135 - 145 mEq/L   Potassium 4.4 3.5 - 5.1 mEq/L   Chloride 101 96 - 112 mEq/L   CO2 29 19 - 32 mEq/L   Glucose, Bld 99 70 - 99 mg/dL   BUN 12 6 - 23 mg/dL   Creatinine, Ser 0.74 0.40 - 1.20 mg/dL   Total Bilirubin 1.7 (H) 0.2 - 1.2 mg/dL   Alkaline Phosphatase 62 39 - 117 U/L   AST 21 0 - 37 U/L   ALT 16 0 - 35 U/L   Total Protein 7.1 6.0 - 8.3 g/dL   Albumin 4.4 3.5 - 5.2 g/dL   Calcium 10.1 8.4 - 10.5 mg/dL   GFR 83.95 >60.00 mL/min  Lipid panel  Result  Value Ref Range   Cholesterol 202 (H) 0 - 200 mg/dL   Triglycerides 51.0 0.0 - 149.0 mg/dL   HDL 98.50 >39.00 mg/dL   VLDL 10.2 0.0 - 40.0 mg/dL   LDL Cholesterol 93 0 - 99 mg/dL   Total CHOL/HDL Ratio 2    NonHDL 103.66   TSH  Result Value Ref Range   TSH 2.44 0.35 - 4.50 uIU/mL    Patient Active Problem List   Diagnosis Date Noted  . Estrogen deficiency 09/18/2015  . Anxiety 04/14/2011  . Hypertension 03/03/2011  . Routine general medical examination at a health care facility 03/03/2011  . DEPRESSION 07/21/2007  . ALLERGIC RHINITIS 07/21/2007  . Osteoporosis 07/21/2007   Past Medical History  Diagnosis Date  . Osteoporosis   . Allergy     allergic rhinitis  . Gilbert's syndrome     with high bilirubin  . Hypertension    Past Surgical History  Procedure Laterality Date  . Tonsillectomy and adenoidectomy  1957   Social History  Substance Use Topics  . Smoking status: Former Smoker    Quit date: 11/10/2000  . Smokeless tobacco: Never Used  . Alcohol Use: 0.0 oz/week    0 Standard drinks or equivalent per week     Comment: 2/4 x weekly   Family History  Problem Relation Age of Onset  . Heart disease Mother     heart attack  . Hypertension Mother   . Breast cancer Mother     ages 44 and 8  . Hypertension Father    . Hypertension Sister   . Osteopenia Sister   . Heart disease Maternal Grandfather     MI  . Lung cancer Paternal Uncle     heavy smoker   Allergies  Allergen Reactions  . Boniva [Ibandronic Acid] Nausea Only  . Lisinopril Other (See Comments)    Severe fatigue  . Norvasc [Amlodipine Besylate]     Fatigue/ depression  . Tetanus Toxoid     REACTION: very fatigued for 1 week after   Current Outpatient Prescriptions on File Prior to Visit  Medication Sig Dispense Refill  . Cholecalciferol (VITAMIN D) 2000 UNITS tablet Take 2,000 Units by mouth daily.    . fexofenadine (ALLEGRA ALLERGY CHILDRENS) 30 MG tablet Take 15 mg by mouth daily as needed.    . Magnesium 100 MG TABS Take by mouth.    . Multiple Vitamin (MULTIVITAMIN) capsule Take 1 capsule by mouth daily.     . vitamin C (ASCORBIC ACID) 500 MG tablet Take 500 mg by mouth daily. As needed     No current facility-administered medications on file prior to visit.    Review of Systems    Review of Systems  Constitutional: Negative for fever, appetite change, fatigue and unexpected weight change.  Eyes: Negative for pain and visual disturbance.  Respiratory: Negative for cough and shortness of breath.   Cardiovascular: Negative for cp or palpitations    Gastrointestinal: Negative for nausea, diarrhea and constipation.  Genitourinary: Negative for urgency and frequency.  Skin: Negative for pallor or rash   Neurological: Negative for weakness, light-headedness, numbness and headaches.  Hematological: Negative for adenopathy. Does not bruise/bleed easily.  Psychiatric/Behavioral: Negative for dysphoric mood. The patient is not nervous/anxious.      Objective:   Physical Exam  Constitutional: She appears well-developed and well-nourished. No distress.  Well appearing   HENT:  Head: Normocephalic and atraumatic.  Right Ear: External ear normal.  Left Ear: External ear  normal.  Nose: Nose normal.  Mouth/Throat: Oropharynx  is clear and moist.  Eyes: Conjunctivae and EOM are normal. Pupils are equal, round, and reactive to light. Right eye exhibits no discharge. Left eye exhibits no discharge. No scleral icterus.  Neck: Normal range of motion. Neck supple. No JVD present. Carotid bruit is not present. No thyromegaly present.  Cardiovascular: Normal rate, regular rhythm, normal heart sounds and intact distal pulses.  Exam reveals no gallop.   Pulmonary/Chest: Effort normal and breath sounds normal. No respiratory distress. She has no wheezes. She has no rales.  Abdominal: Soft. Bowel sounds are normal. She exhibits no distension and no mass. There is no tenderness.  Musculoskeletal: She exhibits no edema or tenderness.  No kyphosis  Lymphadenopathy:    She has no cervical adenopathy.  Neurological: She is alert. She has normal reflexes. No cranial nerve deficit. She exhibits normal muscle tone. Coordination normal.  Skin: Skin is warm and dry. No rash noted. No erythema. No pallor.  Lentigo noted   Psychiatric: She has a normal mood and affect.  Pleasant and talkative           Assessment & Plan:   Problem List Items Addressed This Visit      Cardiovascular and Mediastinum   Hypertension    bp in fair control at this time  BP Readings from Last 1 Encounters:  09/18/15 135/80   No changes needed Disc lifstyle change with low sodium diet and exercise  This is stable on benicar      Relevant Medications   olmesartan (BENICAR) 20 MG tablet     Musculoskeletal and Integument   Osteoporosis    No falls or fx On ca and D Did not tol boniva Will order dexa at [facility] in Jan (can no longer get at [facility] not comparable at diff machine)  Disc need for calcium/ vitamin D/ wt bearing exercise and bone density test every 2 y to monitor Disc safety/ fracture risk in detail          Other   Estrogen deficiency   Relevant Orders   DG Bone Density   Routine general medical examination at a health  care facility - Primary    Reviewed health habits including diet and exercise and skin cancer prevention Reviewed appropriate screening tests for age  Also reviewed health mt list, fam hx and immunization status , as well as social and family history   See HPI Labs rev  Will ref for dexa Sees gyn  Continue great self care        Other Visit Diagnoses    Need for influenza vaccination        Relevant Orders    Flu Vaccine QUAD 36+ mos PF IM (Fluarix & Fluzone Quad PF) (Completed)

## 2015-09-18 NOTE — Progress Notes (Signed)
Pre visit review using our clinic review tool, if applicable. No additional management support is needed unless otherwise documented below in the visit note. 

## 2015-09-18 NOTE — Patient Instructions (Addendum)
Stop at check out for referral for bone density test  Continue your calcium and vitamin D  Keep up the good work taking care of yourself  Stay active   Flu shot today

## 2015-09-20 NOTE — Assessment & Plan Note (Signed)
bp in fair control at this time  BP Readings from Last 1 Encounters:  09/18/15 135/80   No changes needed Disc lifstyle change with low sodium diet and exercise  This is stable on benicar

## 2015-09-20 NOTE — Assessment & Plan Note (Addendum)
No falls or fx On ca and D Did not tol boniva Will order dexa at [facility] in Jan (can no longer get at [facility] not comparable at diff machine)  Disc need for calcium/ vitamin D/ wt bearing exercise and bone density test every 2 y to monitor Disc safety/ fracture risk in detail

## 2015-09-20 NOTE — Assessment & Plan Note (Signed)
Reviewed health habits including diet and exercise and skin cancer prevention Reviewed appropriate screening tests for age  Also reviewed health mt list, fam hx and immunization status , as well as social and family history   See HPI Labs rev  Will ref for dexa Sees gyn  Continue great self care

## 2015-11-16 ENCOUNTER — Ambulatory Visit: Payer: BC Managed Care – PPO | Admitting: Obstetrics & Gynecology

## 2015-11-28 ENCOUNTER — Inpatient Hospital Stay: Admission: RE | Admit: 2015-11-28 | Payer: BLUE CROSS/BLUE SHIELD | Source: Ambulatory Visit

## 2016-01-16 ENCOUNTER — Ambulatory Visit (INDEPENDENT_AMBULATORY_CARE_PROVIDER_SITE_OTHER)
Admission: RE | Admit: 2016-01-16 | Discharge: 2016-01-16 | Disposition: A | Discharge status: Home / Self Care | Payer: BLUE CROSS/BLUE SHIELD | Source: Ambulatory Visit | Attending: Family Medicine | Admitting: Family Medicine

## 2016-01-16 DIAGNOSIS — E2839 Other primary ovarian failure: Secondary | ICD-10-CM | POA: Diagnosis not present

## 2016-01-16 LAB — HM DEXA SCAN

## 2016-01-20 DIAGNOSIS — E2839 Other primary ovarian failure: Secondary | ICD-10-CM | POA: Diagnosis not present

## 2016-01-22 ENCOUNTER — Encounter: Payer: Self-pay | Admitting: *Deleted

## 2016-05-30 ENCOUNTER — Other Ambulatory Visit: Payer: Self-pay | Admitting: Obstetrics & Gynecology

## 2016-05-30 DIAGNOSIS — Z1231 Encounter for screening mammogram for malignant neoplasm of breast: Secondary | ICD-10-CM

## 2016-06-17 ENCOUNTER — Encounter: Payer: Self-pay | Admitting: Obstetrics & Gynecology

## 2016-06-17 ENCOUNTER — Ambulatory Visit (INDEPENDENT_AMBULATORY_CARE_PROVIDER_SITE_OTHER): Payer: BLUE CROSS/BLUE SHIELD | Admitting: Obstetrics & Gynecology

## 2016-06-17 VITALS — BP 130/86 | HR 76 | Resp 14 | Ht 63.0 in | Wt 114.4 lb

## 2016-06-17 DIAGNOSIS — Z124 Encounter for screening for malignant neoplasm of cervix: Secondary | ICD-10-CM

## 2016-06-17 DIAGNOSIS — Z0000 Encounter for general adult medical examination without abnormal findings: Secondary | ICD-10-CM

## 2016-06-17 DIAGNOSIS — Z205 Contact with and (suspected) exposure to viral hepatitis: Secondary | ICD-10-CM | POA: Diagnosis not present

## 2016-06-17 DIAGNOSIS — Z01419 Encounter for gynecological examination (general) (routine) without abnormal findings: Secondary | ICD-10-CM | POA: Diagnosis not present

## 2016-06-17 LAB — POCT URINALYSIS DIPSTICK
Bilirubin, UA: NEGATIVE
Glucose, UA: NEGATIVE
Ketones, UA: NEGATIVE
Leukocytes, UA: NEGATIVE
NITRITE UA: NEGATIVE
PH UA: 5
Protein, UA: NEGATIVE
RBC UA: NEGATIVE
UROBILINOGEN UA: NEGATIVE

## 2016-06-17 NOTE — Progress Notes (Signed)
65 y.o. G26P3003 Divorced Caucasian F here for annual exam.  Doing well.  Had a new grandson this year.  Declines vaginal bleeding.  PCP:  Dr. Glori Bickers.  Patient's last menstrual period was 11/10/2002.          Sexually active: Yes.    The current method of family planning is post menopausal status.    Exercising: Yes.    walking, running, weight bearing exercises Smoker:  no  Health Maintenance: Pap:  08/29/2013 negative, HR HPV negative  History of abnormal Pap:  no MMG:  07/09/2015 BIRADS 1 negative  Colonoscopy:  09/24/2008  BMD:   01/16/2016 osteoporosis  TDaP:  09/15/2013 Pneumonia vaccine(s): never Zostavax:   03/23/2013  Hep C testing: draw at end of appointment  Screening Labs: PCP, Hb today: PCP, Urine today: normal     reports that she quit smoking about 15 years ago. She has never used smokeless tobacco. She reports that she drinks alcohol. She reports that she does not use drugs.  Past Medical History:  Diagnosis Date  . Allergy    allergic rhinitis  . Gilbert's syndrome    with high bilirubin  . Hypertension   . Osteoporosis     Past Surgical History:  Procedure Laterality Date  . TONSILLECTOMY AND ADENOIDECTOMY  1957    Current Outpatient Prescriptions  Medication Sig Dispense Refill  . Cholecalciferol (VITAMIN D) 2000 UNITS tablet Take 2,000 Units by mouth daily.    . fexofenadine (ALLEGRA ALLERGY CHILDRENS) 30 MG tablet Take 15 mg by mouth daily as needed.    . Magnesium 100 MG TABS Take by mouth.    . Multiple Vitamin (MULTIVITAMIN) capsule Take 1 capsule by mouth daily.     Marland Kitchen olmesartan (BENICAR) 20 MG tablet TAKE ONE TABLET BY MOUTH EVERY DAY 30 tablet 11  . vitamin C (ASCORBIC ACID) 500 MG tablet Take 500 mg by mouth daily. As needed     No current facility-administered medications for this visit.     Family History  Problem Relation Age of Onset  . Heart disease Mother     heart attack  . Hypertension Mother   . Breast cancer Mother     ages 81  and 66  . Hypertension Father   . Hypertension Sister   . Osteopenia Sister   . Heart disease Maternal Grandfather     MI  . Lung cancer Paternal Uncle     heavy smoker    ROS:  Pertinent items are noted in HPI.  Otherwise, a comprehensive ROS was negative.  Exam:   Vitals:   06/17/16 1451  BP: 130/86  Pulse: 76  Resp: 14  Weight: 114 lb 6.4 oz (51.9 kg)  Height: 5\' 3"  (1.6 m)    General appearance: alert, cooperative and appears stated age Head: Normocephalic, without obvious abnormality, atraumatic Neck: no adenopathy, supple, symmetrical, trachea midline and thyroid normal to inspection and palpation Lungs: clear to auscultation bilaterally Breasts: normal appearance, no masses or tenderness Heart: regular rate and rhythm Abdomen: soft, non-tender; bowel sounds normal; no masses,  no organomegaly Extremities: extremities normal, atraumatic, no cyanosis or edema Skin: Skin color, texture, turgor normal. No rashes or lesions Lymph nodes: Cervical, supraclavicular, and axillary nodes normal. No abnormal inguinal nodes palpated Neurologic: Grossly normal   Pelvic: External genitalia:  no lesions              Urethra:  normal appearing urethra with no masses, tenderness or lesions  Bartholins and Skenes: normal                 Vagina: normal appearing vagina with normal color and discharge, no lesions              Cervix: no lesions              Pap taken: Yes.   Bimanual Exam:  Uterus:  normal size, contour, position, consistency, mobility, non-tender              Adnexa: normal adnexa and no mass, fullness, tenderness               Rectovaginal: Confirms               Anus:  normal sphincter tone, no lesions  Chaperone was present for exam.  A:  Well Woman with normal exam PMP, no HRT Partnered Osteoporosis, h/o fosamax use Family hx of breast cancer in mother (with 2 primaries).  D/w pt genetic counseling.  She saw Roma Kayser in the past but didn't  do the testing.  She continues to "think about it". Sees dermatology (Dr. Derrel Nip) yearly  P: Mammogram yearly.  Doing 3D.due to breast density. Pap and HR HPV today Labs with PCP Hep C testing recommended.  Pt will return for this. Return annually or prn

## 2016-06-20 ENCOUNTER — Encounter: Payer: Self-pay | Admitting: Gastroenterology

## 2016-06-20 LAB — IPS PAP TEST WITH HPV

## 2016-06-24 ENCOUNTER — Other Ambulatory Visit: Payer: BLUE CROSS/BLUE SHIELD

## 2016-06-30 ENCOUNTER — Encounter: Payer: Self-pay | Admitting: Gastroenterology

## 2016-07-01 ENCOUNTER — Other Ambulatory Visit (INDEPENDENT_AMBULATORY_CARE_PROVIDER_SITE_OTHER): Payer: BLUE CROSS/BLUE SHIELD

## 2016-07-01 DIAGNOSIS — Z205 Contact with and (suspected) exposure to viral hepatitis: Secondary | ICD-10-CM

## 2016-07-02 LAB — HEPATITIS C ANTIBODY: HCV AB: NEGATIVE

## 2016-07-08 ENCOUNTER — Ambulatory Visit: Payer: BLUE CROSS/BLUE SHIELD

## 2016-07-30 ENCOUNTER — Ambulatory Visit
Admission: RE | Admit: 2016-07-30 | Discharge: 2016-07-30 | Disposition: A | Discharge status: Home / Self Care | Payer: Medicare Other | Source: Ambulatory Visit | Attending: Obstetrics & Gynecology | Admitting: Obstetrics & Gynecology

## 2016-07-30 DIAGNOSIS — Z1231 Encounter for screening mammogram for malignant neoplasm of breast: Secondary | ICD-10-CM | POA: Diagnosis not present

## 2016-08-06 ENCOUNTER — Telehealth: Payer: Self-pay

## 2016-08-06 ENCOUNTER — Other Ambulatory Visit: Payer: Self-pay | Admitting: Obstetrics & Gynecology

## 2016-08-06 ENCOUNTER — Ambulatory Visit
Admission: RE | Admit: 2016-08-06 | Discharge: 2016-08-06 | Disposition: A | Discharge status: Home / Self Care | Payer: Medicare Other | Source: Ambulatory Visit | Attending: Obstetrics & Gynecology | Admitting: Obstetrics & Gynecology

## 2016-08-06 ENCOUNTER — Telehealth: Payer: Self-pay | Admitting: Obstetrics & Gynecology

## 2016-08-06 DIAGNOSIS — R928 Other abnormal and inconclusive findings on diagnostic imaging of breast: Secondary | ICD-10-CM

## 2016-08-06 DIAGNOSIS — N63 Unspecified lump in breast: Secondary | ICD-10-CM | POA: Diagnosis not present

## 2016-08-06 NOTE — Telephone Encounter (Signed)
[facility] called requesting an order for a diagnostic right breast ultrasound.

## 2016-08-06 NOTE — Telephone Encounter (Signed)
Spoke with Nevin Bloodgood at the [facility] at the time of incoming call. Nevin Bloodgood states that the patient had a screening mammogram on 07/30/2016 that showed a possible mass in the right breast. The patient is in the office today for follow up diagnostic imaging with ultrasound of the right breast. Order is needed. Verbal order provided to [facility] for right breast diagnostic mammogram and ultrasound. Nevin Bloodgood is agreeable and will proceed with patient's imaging.  Routing to provider for final review. Patient agreeable to disposition. Will close encounter.

## 2016-08-06 NOTE — Telephone Encounter (Signed)
Please see next telephone call dated with today's date.  Will close encounter.

## 2016-08-07 ENCOUNTER — Telehealth: Payer: Self-pay | Admitting: Obstetrics & Gynecology

## 2016-08-07 NOTE — Telephone Encounter (Signed)
Spoke with patient. Patient would like to come in for OV to discuss recent 08/06/16 MMG results. Patient scheduled for 08/11/16 at 2:30pm. Patient is agreeable to date and time.   Routing to provider for final review. Patient is agreeable to disposition. Will close encounter.

## 2016-08-07 NOTE — Telephone Encounter (Signed)
Patient had a MMG yesterday and would like to come in for a consult about her results. Patient has a few questions.

## 2016-08-11 ENCOUNTER — Ambulatory Visit (INDEPENDENT_AMBULATORY_CARE_PROVIDER_SITE_OTHER): Payer: Medicare Other | Admitting: Obstetrics & Gynecology

## 2016-08-11 ENCOUNTER — Encounter: Payer: Self-pay | Admitting: Obstetrics & Gynecology

## 2016-08-11 DIAGNOSIS — R928 Other abnormal and inconclusive findings on diagnostic imaging of breast: Secondary | ICD-10-CM | POA: Diagnosis not present

## 2016-08-12 NOTE — Progress Notes (Signed)
GYNECOLOGY  VISIT   HPI: 65 y.o. G16P3003 Divorced Caucasian female here to discuss MMG from 07/30/16 and follow-up imaging done 08/06/16.  States she just has some questions that weren't answered when she went for follow-up.  On targeted ultrasound, she had a benign appearing intramammary lymph node noted.  She wants to know how one can tell this is benign.  Also wants to know if she should "push" for additional follow up in 4-6 months.  Also wants to know where the node was located.  Answered all questions to the best of my ability.  Pt satisfied.  No additional follow up needed except for routine screening in 1year.  GYNECOLOGIC HISTORY: Patient's last menstrual period was 11/10/2002. Hormonal therapy:  none  Patient Active Problem List   Diagnosis Date Noted  . Estrogen deficiency 09/18/2015  . Anxiety 04/14/2011  . Hypertension 03/03/2011  . Routine general medical examination at a health care facility 03/03/2011  . DEPRESSION 07/21/2007  . ALLERGIC RHINITIS 07/21/2007  . Osteoporosis 07/21/2007    Past Medical History:  Diagnosis Date  . Allergy    allergic rhinitis  . Gilbert's syndrome    with high bilirubin  . Hypertension   . Osteoporosis     Past Surgical History:  Procedure Laterality Date  . TONSILLECTOMY AND ADENOIDECTOMY  1957    MEDS:  Reviewed in EPIC and UTD  ALLERGIES: Boniva [ibandronic acid]; Lisinopril; Norvasc [amlodipine besylate]; and Tetanus toxoid  Family History  Problem Relation Age of Onset  . Heart disease Mother     heart attack  . Hypertension Mother   . Breast cancer Mother     ages 55 and 106  . Hypertension Father   . Heart disease Maternal Grandfather     MI  . Hypertension Sister   . Osteopenia Sister   . Lung cancer Paternal Uncle     heavy smoker    SH:  Divorced, non smoker  Review of Systems  All other systems reviewed and are negative.   PHYSICAL EXAMINATION:   General appearance: alert, cooperative and appears  stated age   Assessment: Abnormal screening MMG that with additional images has been read as normal.  Plan: Follow up for routine imaging 1 year.  Pt expressed appreciation for being able to come in and talk about this.

## 2016-09-15 ENCOUNTER — Other Ambulatory Visit: Payer: Self-pay | Admitting: Family Medicine

## 2016-09-16 DIAGNOSIS — J370 Chronic laryngitis: Secondary | ICD-10-CM | POA: Diagnosis not present

## 2016-09-16 DIAGNOSIS — J320 Chronic maxillary sinusitis: Secondary | ICD-10-CM | POA: Diagnosis not present

## 2016-09-16 DIAGNOSIS — J301 Allergic rhinitis due to pollen: Secondary | ICD-10-CM | POA: Diagnosis not present

## 2016-09-16 DIAGNOSIS — J322 Chronic ethmoidal sinusitis: Secondary | ICD-10-CM | POA: Diagnosis not present

## 2016-09-18 ENCOUNTER — Telehealth: Payer: Self-pay | Admitting: Family Medicine

## 2016-09-18 DIAGNOSIS — I10 Essential (primary) hypertension: Secondary | ICD-10-CM

## 2016-09-18 NOTE — Telephone Encounter (Signed)
-----   Message from Inocencio Homes, [place] sent at 09/12/2016 11:06 AM EDT ----- Regarding: Lab orders for friday 09/19/16.  Lab orders for CPX please. Thank you.

## 2016-09-19 ENCOUNTER — Other Ambulatory Visit (INDEPENDENT_AMBULATORY_CARE_PROVIDER_SITE_OTHER): Payer: Medicare Other

## 2016-09-19 DIAGNOSIS — I10 Essential (primary) hypertension: Secondary | ICD-10-CM | POA: Diagnosis not present

## 2016-09-19 LAB — CBC WITH DIFFERENTIAL/PLATELET
BASOS ABS: 0 10*3/uL (ref 0.0–0.1)
Basophils Relative: 0.7 % (ref 0.0–3.0)
EOS ABS: 0.2 10*3/uL (ref 0.0–0.7)
Eosinophils Relative: 3.4 % (ref 0.0–5.0)
HCT: 40.4 % (ref 36.0–46.0)
Hemoglobin: 13.8 g/dL (ref 12.0–15.0)
LYMPHS ABS: 1.7 10*3/uL (ref 0.7–4.0)
LYMPHS PCT: 23.9 % (ref 12.0–46.0)
MCHC: 34.2 g/dL (ref 30.0–36.0)
MCV: 92.5 fl (ref 78.0–100.0)
MONOS PCT: 6.4 % (ref 3.0–12.0)
Monocytes Absolute: 0.5 10*3/uL (ref 0.1–1.0)
NEUTROS PCT: 65.6 % (ref 43.0–77.0)
Neutro Abs: 4.7 10*3/uL (ref 1.4–7.7)
PLATELETS: 292 10*3/uL (ref 150.0–400.0)
RBC: 4.37 Mil/uL (ref 3.87–5.11)
RDW: 13.2 % (ref 11.5–15.5)
WBC: 7.2 10*3/uL (ref 4.0–10.5)

## 2016-09-19 LAB — LIPID PANEL
CHOL/HDL RATIO: 2
Cholesterol: 189 mg/dL (ref 0–200)
HDL: 107.3 mg/dL (ref >39.00)
LDL Cholesterol: 73 mg/dL (ref 0–99)
NONHDL: 82.04
Triglycerides: 44 mg/dL (ref 0.0–149.0)
VLDL: 8.8 mg/dL (ref 0.0–40.0)

## 2016-09-19 LAB — COMPREHENSIVE METABOLIC PANEL
ALK PHOS: 54 U/L (ref 39–117)
ALT: 14 U/L (ref 0–35)
AST: 21 U/L (ref 0–37)
Albumin: 4.8 g/dL (ref 3.5–5.2)
BILIRUBIN TOTAL: 1.3 mg/dL — AB (ref 0.2–1.2)
BUN: 11 mg/dL (ref 6–23)
CO2: 26 meq/L (ref 19–32)
CREATININE: 0.69 mg/dL (ref 0.40–1.20)
Calcium: 10 mg/dL (ref 8.4–10.5)
Chloride: 96 mEq/L (ref 96–112)
GFR: 90.71 mL/min (ref >60.00)
GLUCOSE: 96 mg/dL (ref 70–99)
Potassium: 4.4 mEq/L (ref 3.5–5.1)
Sodium: 131 mEq/L — ABNORMAL LOW (ref 135–145)
TOTAL PROTEIN: 7.2 g/dL (ref 6.0–8.3)

## 2016-09-19 LAB — TSH: TSH: 2.05 u[IU]/mL (ref 0.35–4.50)

## 2016-09-26 ENCOUNTER — Encounter: Payer: BLUE CROSS/BLUE SHIELD | Admitting: Family Medicine

## 2016-10-06 ENCOUNTER — Encounter: Payer: Self-pay | Admitting: Family Medicine

## 2016-10-06 ENCOUNTER — Ambulatory Visit (INDEPENDENT_AMBULATORY_CARE_PROVIDER_SITE_OTHER): Payer: Medicare Other | Admitting: Family Medicine

## 2016-10-06 ENCOUNTER — Telehealth: Payer: Self-pay | Admitting: Family Medicine

## 2016-10-06 VITALS — BP 128/70 | HR 73 | Temp 98.3°F | Ht 64.0 in | Wt 113.5 lb

## 2016-10-06 DIAGNOSIS — Z0000 Encounter for general adult medical examination without abnormal findings: Secondary | ICD-10-CM | POA: Diagnosis not present

## 2016-10-06 DIAGNOSIS — M810 Age-related osteoporosis without current pathological fracture: Secondary | ICD-10-CM

## 2016-10-06 DIAGNOSIS — I10 Essential (primary) hypertension: Secondary | ICD-10-CM

## 2016-10-06 DIAGNOSIS — Z23 Encounter for immunization: Secondary | ICD-10-CM | POA: Diagnosis not present

## 2016-10-06 NOTE — Assessment & Plan Note (Signed)
Watched by gyn  Rev dexa 3/17 Intol of boniva in the past On ca and D with nl D level No falls or fractures

## 2016-10-06 NOTE — Progress Notes (Signed)
Pre visit review using our clinic review tool, if applicable. No additional management support is needed unless otherwise documented below in the visit note. 

## 2016-10-06 NOTE — Telephone Encounter (Signed)
Addressed through result notes  

## 2016-10-06 NOTE — Progress Notes (Signed)
Subjective:    Patient ID: Karen Rice, female    DOB: 06-11-1951, 65 y.o.   MRN: [id number]  HPI Here for welcome to medicare exam/visit   I have personally reviewed the Medicare Annual Wellness questionnaire and have noted 1. The patient's medical and social history 2. Their use of alcohol, tobacco or illicit drugs 3. Their current medications and supplements 4. The patient's functional ability including ADL's, fall risks, home safety risks and hearing or visual             impairment. 5. Diet and physical activities 6. Evidence for depression or mood disorders  The patients weight, height, BMI have been recorded in the chart and visual acuity is per eye clinic.  I have made referrals, counseling and provided education to the patient based review of the above and I have provided the pt with a written personalized care plan for preventive services. Reviewed and updated provider list, see scanned forms.  See scanned forms.  Routine anticipatory guidance given to patient.  See health maintenance. HIV screening-not interested and low risk  Colon cancer screening colonoscopy 11/09 -normal  Breast cancer screening  Mammogram 9/17 (called back for addn films and was nl) Self breast exam -no lumps or changes   (had exam gyn Dr Sabra Heck)  Mother had breast cancer  Pap 8/17-nl out of office with no recall Flu vaccine-will get today  Tetanus vaccine 11/14 Pneumovax- due for her prevnar- would rather get this a week or two after the flu shot  Zoster vaccine 5/14 dexa 3/17-early OP- watched by gyn Did not tolerate boniva in the past  No falls or fractures and she exercise  Takes her calcium and vit D Last D level nl at 47.7 Hep C screen neg 8/17 Advance directive-has a living will and power of attorney  Cognitive function addressed- see scanned forms- and if abnormal then additional documentation follows. --no major concerns  occ walks into a room and forgets why - then remembers  She does  socialize   Wt Readings from Last 3 Encounters:  10/06/16 113 lb 8 oz (51.5 kg)  06/17/16 114 lb 6.4 oz (51.9 kg)  09/18/15 115 lb (52.2 kg)  she thinks she eats enough -has changed diet a bit - more veg/ more protein and less sugar  bmi is 19.4 Exercises   EKG today NSR rate 63 with short PR interval No acute changes   PMH and SH reviewed  Meds, vitals, and allergies reviewed.   ROS: See HPI.  Otherwise negative.    bp is stable today  No cp or palpitations or headaches or edema  No side effects to medicines  BP Readings from Last 3 Encounters:  10/06/16 128/70  06/17/16 130/86  09/18/15 135/80    On benicar Well controlled   Results for orders placed or performed in visit on 09/19/16  CBC with Differential/Platelet  Result Value Ref Range   WBC 7.2 4.0 - 10.5 K/uL   RBC 4.37 3.87 - 5.11 Mil/uL   Hemoglobin 13.8 12.0 - 15.0 g/dL   HCT 40.4 36.0 - 46.0 %   MCV 92.5 78.0 - 100.0 fl   MCHC 34.2 30.0 - 36.0 g/dL   RDW 13.2 11.5 - 15.5 %   Platelets 292.0 150.0 - 400.0 K/uL   Neutrophils Relative % 65.6 43.0 - 77.0 %   Lymphocytes Relative 23.9 12.0 - 46.0 %   Monocytes Relative 6.4 3.0 - 12.0 %   Eosinophils Relative 3.4 0.0 -  5.0 %   Basophils Relative 0.7 0.0 - 3.0 %   Neutro Abs 4.7 1.4 - 7.7 K/uL   Lymphs Abs 1.7 0.7 - 4.0 K/uL   Monocytes Absolute 0.5 0.1 - 1.0 K/uL   Eosinophils Absolute 0.2 0.0 - 0.7 K/uL   Basophils Absolute 0.0 0.0 - 0.1 K/uL  Comprehensive metabolic panel  Result Value Ref Range   Sodium 131 (L) 135 - 145 mEq/L   Potassium 4.4 3.5 - 5.1 mEq/L   Chloride 96 96 - 112 mEq/L   CO2 26 19 - 32 mEq/L   Glucose, Bld 96 70 - 99 mg/dL   BUN 11 6 - 23 mg/dL   Creatinine, Ser 0.69 0.40 - 1.20 mg/dL   Total Bilirubin 1.3 (H) 0.2 - 1.2 mg/dL   Alkaline Phosphatase 54 39 - 117 U/L   AST 21 0 - 37 U/L   ALT 14 0 - 35 U/L   Total Protein 7.2 6.0 - 8.3 g/dL   Albumin 4.8 3.5 - 5.2 g/dL   Calcium 10.0 8.4 - 10.5 mg/dL   GFR 90.71 >60.00 mL/min    Lipid panel  Result Value Ref Range   Cholesterol 189 0 - 200 mg/dL   Triglycerides 44.0 0.0 - 149.0 mg/dL   HDL 107.30 >39.00 mg/dL   VLDL 8.8 0.0 - 40.0 mg/dL   LDL Cholesterol 73 0 - 99 mg/dL   Total CHOL/HDL Ratio 2    NonHDL 82.04   TSH  Result Value Ref Range   TSH 2.05 0.35 - 4.50 uIU/mL    She drinks a lot of water before she gets her blood draw  Good labs overall -great cholesterol   Patient Active Problem List   Diagnosis Date Noted  . Welcome to Medicare preventive visit 10/06/2016  . Estrogen deficiency 09/18/2015  . Anxiety 04/14/2011  . Hypertension 03/03/2011  . Routine general medical examination at a health care facility 03/03/2011  . DEPRESSION 07/21/2007  . ALLERGIC RHINITIS 07/21/2007  . Osteoporosis 07/21/2007   Past Medical History:  Diagnosis Date  . Allergy    allergic rhinitis  . Gilbert's syndrome    with high bilirubin  . Hypertension   . Osteoporosis    Past Surgical History:  Procedure Laterality Date  . TONSILLECTOMY AND ADENOIDECTOMY  1957   Social History  Substance Use Topics  . Smoking status: Former Smoker    Quit date: 11/10/2000  . Smokeless tobacco: Never Used  . Alcohol use 0.0 oz/week     Comment: 2/4 x weekly   Family History  Problem Relation Age of Onset  . Heart disease Mother     heart attack  . Hypertension Mother   . Breast cancer Mother     ages 30 and 99  . Hypertension Father   . Heart disease Maternal Grandfather     MI  . Hypertension Sister   . Osteopenia Sister   . Lung cancer Paternal Uncle     heavy smoker   Allergies  Allergen Reactions  . Boniva [Ibandronic Acid] Nausea Only  . Lisinopril Other (See Comments)    Severe fatigue  . Norvasc [Amlodipine Besylate]     Fatigue/ depression  . Tetanus Toxoid     REACTION: very fatigued for 1 week after   Current Outpatient Prescriptions on File Prior to Visit  Medication Sig Dispense Refill  . Cholecalciferol (VITAMIN D) 2000 UNITS tablet Take  2,000 Units by mouth daily.    . fexofenadine (ALLEGRA ALLERGY CHILDRENS) 30  MG tablet Take 15 mg by mouth daily as needed.    . Magnesium 100 MG TABS Take by mouth.    . Multiple Vitamin (MULTIVITAMIN) capsule Take 1 capsule by mouth daily.     Marland Kitchen olmesartan (BENICAR) 20 MG tablet TAKE 1 TABLET BY MOUTH EVERY DAY 30 tablet 5  . vitamin C (ASCORBIC ACID) 500 MG tablet Take 500 mg by mouth daily. As needed     No current facility-administered medications on file prior to visit.     Review of Systems    Review of Systems  Constitutional: Negative for fever, appetite change, fatigue and unexpected weight change.  Eyes: Negative for pain and visual disturbance.  Respiratory: Negative for cough and shortness of breath.   Cardiovascular: Negative for cp or palpitations    Gastrointestinal: Negative for nausea, diarrhea and constipation.  Genitourinary: Negative for urgency and frequency.  Skin: Negative for pallor or rash   Neurological: Negative for weakness, light-headedness, numbness and headaches.  Hematological: Negative for adenopathy. Does not bruise/bleed easily.  Psychiatric/Behavioral: Negative for dysphoric mood. The patient is not nervous/anxious.      Objective:   Physical Exam  Constitutional: She appears well-developed and well-nourished. No distress.  Well appearing   HENT:  Head: Normocephalic and atraumatic.  Right Ear: External ear normal.  Left Ear: External ear normal.  Nose: Nose normal.  Mouth/Throat: Oropharynx is clear and moist.  Eyes: Conjunctivae and EOM are normal. Pupils are equal, round, and reactive to light. Right eye exhibits no discharge. Left eye exhibits no discharge. No scleral icterus.  Neck: Normal range of motion. Neck supple. No JVD present. Carotid bruit is not present. No thyromegaly present.  Cardiovascular: Normal rate, regular rhythm, normal heart sounds and intact distal pulses.  Exam reveals no gallop.   Pulmonary/Chest: Effort normal and  breath sounds normal. No respiratory distress. She has no wheezes. She has no rales.  Abdominal: Soft. Bowel sounds are normal. She exhibits no distension and no mass. There is no tenderness.  Genitourinary:  Genitourinary Comments: Exam done by her gyn  Musculoskeletal: She exhibits no edema or tenderness.  Lymphadenopathy:    She has no cervical adenopathy.  Neurological: She is alert. She has normal reflexes. No cranial nerve deficit. She exhibits normal muscle tone. Coordination normal.  Skin: Skin is warm and dry. No rash noted. No erythema. No pallor.  Solar lentigines diffusely Few sks   Psychiatric: She has a normal mood and affect.          Assessment & Plan:   Problem List Items Addressed This Visit      Cardiovascular and Mediastinum   Hypertension - Primary    bp in fair control at this time  BP Readings from Last 1 Encounters:  10/06/16 128/70   No changes needed Disc lifstyle change with low sodium diet and exercise  Well controlled with generic benicar  Labs reviewed         Musculoskeletal and Integument   Osteoporosis    Watched by gyn  Rev dexa 3/17 Intol of boniva in the past On ca and D with nl D level No falls or fractures         Other   Welcome to Medicare preventive visit    Reviewed health habits including diet and exercise and skin cancer prevention Reviewed appropriate screening tests for age  Also reviewed health mt list, fam hx and immunization status , as well as social and family history   See  HPI Labs reviewed  Flu shot today  No acute changes on EKG Pt will f/u for prevnar (did not want to take it the same day as her flu shot) Enc good diet and exercise       Relevant Orders   EKG 12-Lead (Completed)    Other Visit Diagnoses    Need for influenza vaccination       Relevant Orders   Flu Vaccine QUAD 36+ mos IM (Completed)

## 2016-10-06 NOTE — Telephone Encounter (Signed)
Pt returned your call. please call back. Thanks  °

## 2016-10-06 NOTE — Assessment & Plan Note (Signed)
bp in fair control at this time  BP Readings from Last 1 Encounters:  10/06/16 128/70   No changes needed Disc lifstyle change with low sodium diet and exercise  Well controlled with generic benicar  Labs reviewed

## 2016-10-06 NOTE — Assessment & Plan Note (Signed)
Reviewed health habits including diet and exercise and skin cancer prevention Reviewed appropriate screening tests for age  Also reviewed health mt list, fam hx and immunization status , as well as social and family history   See HPI Labs reviewed  Flu shot today  No acute changes on EKG Pt will f/u for prevnar (did not want to take it the same day as her flu shot) Enc good diet and exercise

## 2016-10-06 NOTE — Patient Instructions (Addendum)
Flu shot today  Make a nurse appt to come back for your prevnar vaccine  Keep up the good work with diet and exercise  EKG today

## 2016-10-15 ENCOUNTER — Ambulatory Visit: Payer: Medicare Other

## 2016-11-12 ENCOUNTER — Ambulatory Visit (INDEPENDENT_AMBULATORY_CARE_PROVIDER_SITE_OTHER): Payer: Medicare Other

## 2016-11-12 DIAGNOSIS — Z23 Encounter for immunization: Secondary | ICD-10-CM | POA: Diagnosis not present

## 2016-12-30 ENCOUNTER — Encounter: Payer: Self-pay | Admitting: Sports Medicine

## 2016-12-30 ENCOUNTER — Ambulatory Visit (INDEPENDENT_AMBULATORY_CARE_PROVIDER_SITE_OTHER): Payer: Medicare Other | Admitting: Sports Medicine

## 2016-12-30 ENCOUNTER — Ambulatory Visit (INDEPENDENT_AMBULATORY_CARE_PROVIDER_SITE_OTHER): Payer: Medicare Other

## 2016-12-30 VITALS — BP 152/88 | HR 83 | Ht 64.0 in | Wt 116.2 lb

## 2016-12-30 DIAGNOSIS — M25561 Pain in right knee: Secondary | ICD-10-CM | POA: Diagnosis not present

## 2016-12-30 DIAGNOSIS — S8991XA Unspecified injury of right lower leg, initial encounter: Secondary | ICD-10-CM | POA: Diagnosis not present

## 2016-12-30 MED ORDER — DICLOFENAC SODIUM 75 MG PO TBEC: 75.0000 mg | DELAYED_RELEASE_TABLET | Freq: Two times a day (BID) | ORAL | 0 refills | Status: DC

## 2016-12-30 NOTE — Patient Instructions (Addendum)
I recommend you obtained a compression sleeve to help with your joint problems. There are many options on the market however I recommend obtaining a Full Knee Body Helix compression sleeve.  You can find information (including how to appropriate measure yourself for sizing) can be found at www.Body http://www.lambert.com/.  Many of these products are health savings account (HSA) eligible.   You can use the compression sleeve at any time throughout the day but is most important to use while being active as well as for 2 hours post-activity.   It is appropriate to ice following activity with the compression sleeve in place.   Please perform the exercise program that Jeneen Rinks has prepared for you and gone over in detail on a daily basis.  In addition to the handout you were provided you can access your program through: [email or web address]   Your unique program code is: [id number]

## 2016-12-30 NOTE — Progress Notes (Signed)
Karen Rice - 65 y.o. female MRN [id number]  Date of birth: April 24, 1951  Office Visit Note: Visit Date: 12/30/2016 PCP: Loura Pardon, MD Referred by: Tower, Wynelle Fanny, MD  Subjective: Chief Complaint  Patient presents with  . injury to right knee    Pt was walking lab about 1 month ago when she injured her knee. She said that it was just a little sore at the time but she still kept walking and doing her exercise. She says that a couple of days ago it started hurting more on the lateral side. She has been resting, elevating, and icing the knee. She has also tried Ibuprofen with some relief. She says in the mornings her knee is the worst, its very stiff.    HPI: Patient presents with one month of right lateral knee pain that has worsened over the past 1 week.  She reports being jerked in a valgus stress type mechanism by her dog at the onset of this episode.  She had 3 weeks of only mild residual discomfort and she would describe more as an awareness than pain.  The past 1 week however it is interfering with her ability to walk 4 miles daily. Her pain is mild in nature.  Denies any catching, locking or giving way.  ROS: Otherwise per HPI.  Objective:  VS:  HT:5\' 4"  (162.6 cm)   WT:116 lb 3.2 oz (52.7 kg)  BMI:20    BP:(!) 152/88  HR:83bpm  TEMP: ( )  RESP:98 % Physical Exam: GENERAL: WDWN, NAD, Non-toxic appearing  PSYCH: Alert & Appropriately interactive Not depressed or anxious appearing  Right KNEE: Overall joint is well aligned, no significant deformity.    Small effusion; none on the right  ROM: 0 to 130.  Extensor mechanism intact  No significant medial or lateral joint line tenderness.    Stable to varus/valgus strain & anterior/posterior drawer.  Normal Lachman's.    No pain with McMurray's testing although there is a small palpable click along the anterior lateral joint line.  Painful Thessaly..    Imaging & Procedures: No results found. 3V x-ray preliminary read:  Minimal degenerative change.  Possible small chondral defect along the lateral femoral condyle but this is minimal.  Overall well-maintained joint space.  PROCEDURE NOTE: THERAPEUTIC EXERCISES (97110) 15 minutes spent for Therapeutic exercises as stated in above notes.  This included exercises focusing on stretching, strengthening, with significant focus on eccentric aspects.  VMO strengthening and hip abduction strengthening emphasized.  Proper technique shown and discussed handout in great detail with ATC.  All questions were discussed and answered.    Assessment & Plan: Problem List Items Addressed This Visit    None    Visit Diagnoses    Right knee pain, unspecified chronicity    -  Primary   Relevant Orders   DG Knee AP/LAT W/Sunrise Right   Acute pain of right knee        Am concerned for probable right lateral meniscal irritation without significant mechanical symptoms.  We discussed multiple options and ultimately decided on pursuing conservative measures at this time including cryotherapy, body Jelks compression sleeve, therapeutic exercises.  We discussed using NSAIDs and a prescription has been sent in for this she will plan to fill if any lack of improvement over the next several days.  She will call the next 2-3 weeks if any further lack of improvement for consideration of intra-articular injection.   Follow-up: Return if symptoms worsen or fail to  improve.   Past Medical/Family/Surgical/Social History: Medications & Allergies reviewed per EMR Patient Active Problem List   Diagnosis Date Noted  . Welcome to Medicare preventive visit 10/06/2016  . Estrogen deficiency 09/18/2015  . Anxiety 04/14/2011  . Hypertension 03/03/2011  . Routine general medical examination at a health care facility 03/03/2011  . DEPRESSION 07/21/2007  . ALLERGIC RHINITIS 07/21/2007  . Osteoporosis 07/21/2007   Past Medical History:  Diagnosis Date  . Allergy    allergic rhinitis  . Gilbert's  syndrome    with high bilirubin  . Hypertension   . Osteoporosis    Family History  Problem Relation Age of Onset  . Heart disease Mother     heart attack  . Hypertension Mother   . Breast cancer Mother     ages 79 and 70  . Hypertension Father   . Heart disease Maternal Grandfather     MI  . Hypertension Sister   . Osteopenia Sister   . Lung cancer Paternal Uncle     heavy smoker   Past Surgical History:  Procedure Laterality Date  . TONSILLECTOMY AND ADENOIDECTOMY  1957   Social History   Occupational History  . Not on file.   Social History Main Topics  . Smoking status: Former Smoker    Quit date: 11/10/2000  . Smokeless tobacco: Never Used  . Alcohol use 0.0 oz/week     Comment: 2/4 x weekly  . Drug use: No  . Sexual activity: Yes    Partners: Female    Birth control/ protection: Post-menopausal

## 2017-01-28 DIAGNOSIS — D225 Melanocytic nevi of trunk: Secondary | ICD-10-CM | POA: Diagnosis not present

## 2017-01-28 DIAGNOSIS — L821 Other seborrheic keratosis: Secondary | ICD-10-CM | POA: Diagnosis not present

## 2017-01-28 DIAGNOSIS — D1801 Hemangioma of skin and subcutaneous tissue: Secondary | ICD-10-CM | POA: Diagnosis not present

## 2017-01-28 DIAGNOSIS — L814 Other melanin hyperpigmentation: Secondary | ICD-10-CM | POA: Diagnosis not present

## 2017-01-28 DIAGNOSIS — I788 Other diseases of capillaries: Secondary | ICD-10-CM | POA: Diagnosis not present

## 2017-03-19 DIAGNOSIS — H43812 Vitreous degeneration, left eye: Secondary | ICD-10-CM | POA: Diagnosis not present

## 2017-03-19 DIAGNOSIS — H524 Presbyopia: Secondary | ICD-10-CM | POA: Diagnosis not present

## 2017-03-19 DIAGNOSIS — H25813 Combined forms of age-related cataract, bilateral: Secondary | ICD-10-CM | POA: Diagnosis not present

## 2017-04-02 ENCOUNTER — Other Ambulatory Visit: Payer: Self-pay | Admitting: Family Medicine

## 2017-06-25 ENCOUNTER — Other Ambulatory Visit: Payer: Self-pay | Admitting: Obstetrics & Gynecology

## 2017-06-25 DIAGNOSIS — Z1231 Encounter for screening mammogram for malignant neoplasm of breast: Secondary | ICD-10-CM

## 2017-08-11 ENCOUNTER — Ambulatory Visit
Admission: RE | Admit: 2017-08-11 | Discharge: 2017-08-11 | Disposition: A | Discharge status: Home / Self Care | Payer: Medicare Other | Source: Ambulatory Visit | Attending: Obstetrics & Gynecology | Admitting: Obstetrics & Gynecology

## 2017-08-11 DIAGNOSIS — Z1231 Encounter for screening mammogram for malignant neoplasm of breast: Secondary | ICD-10-CM

## 2017-08-26 ENCOUNTER — Ambulatory Visit (INDEPENDENT_AMBULATORY_CARE_PROVIDER_SITE_OTHER): Payer: Medicare Other | Admitting: *Deleted

## 2017-08-26 DIAGNOSIS — Z23 Encounter for immunization: Secondary | ICD-10-CM | POA: Diagnosis not present

## 2017-09-15 ENCOUNTER — Ambulatory Visit: Payer: BLUE CROSS/BLUE SHIELD | Admitting: Obstetrics & Gynecology

## 2017-10-04 ENCOUNTER — Telehealth: Payer: Self-pay | Admitting: Family Medicine

## 2017-10-04 DIAGNOSIS — I10 Essential (primary) hypertension: Secondary | ICD-10-CM

## 2017-10-04 NOTE — Telephone Encounter (Signed)
-----   Message from Eustace Pen, LPN sent at 51/76/1607  5:05 PM EST ----- Regarding: Labs 11/29 Lab orders needed. Thank you.  Insurance:  [employer]

## 2017-10-08 ENCOUNTER — Ambulatory Visit: Payer: Medicare Other

## 2017-10-09 ENCOUNTER — Encounter: Payer: Self-pay | Admitting: Family Medicine

## 2017-10-09 ENCOUNTER — Ambulatory Visit (INDEPENDENT_AMBULATORY_CARE_PROVIDER_SITE_OTHER): Payer: Medicare Other | Admitting: Family Medicine

## 2017-10-09 VITALS — BP 128/80 | HR 89 | Temp 98.2°F | Ht 63.25 in | Wt 114.5 lb

## 2017-10-09 DIAGNOSIS — I10 Essential (primary) hypertension: Secondary | ICD-10-CM

## 2017-10-09 DIAGNOSIS — M810 Age-related osteoporosis without current pathological fracture: Secondary | ICD-10-CM | POA: Diagnosis not present

## 2017-10-09 MED ORDER — OLMESARTAN MEDOXOMIL 20 MG PO TABS
20.0000 mg | ORAL_TABLET | Freq: Every day | ORAL | 11 refills | Status: DC
Start: 2017-10-09 — End: 2018-09-27

## 2017-10-09 NOTE — Assessment & Plan Note (Signed)
bp in fair control at this time  BP Readings from Last 1 Encounters:  10/09/17 128/80   No changes needed Disc lifstyle change with low sodium diet and exercise  Labs ordered for dec as planned with her amw visit

## 2017-10-09 NOTE — Assessment & Plan Note (Signed)
dexa 3/17 Followed by gyn  She was intol to boniva -will disc other opt for tx On ca and D  One fall-disc fall prev No fractures Exercises regularly

## 2017-10-09 NOTE — Patient Instructions (Signed)
Keep taking good care of yourself  Continue vitamin D  - discuss osteoporosis with your gyn provider   Take care

## 2017-10-09 NOTE — Progress Notes (Signed)
Subjective:    Patient ID: Karen Rice, female    DOB: May 24, 1951, 66 y.o.   MRN: [id number]  HPI Here for annual f/u of chronic medical problems   Wt Readings from Last 3 Encounters:  10/09/17 114 lb 8 oz (51.9 kg)  12/30/16 116 lb 3.2 oz (52.7 kg)  10/06/16 113 lb 8 oz (51.5 kg)  she mt her wt well  Keeps up her exercise  20.12 kg/m  Has amw planned 10/15/17  (re sched since she was sick)  utd colon and breast cancer screening and imms  Due for colonoscopy in 11/19   Had zoster vaccine 5/14  Mother had breast cancer  No lumps on self exam  Sees gyn in 2 mo   dexa 3/17 OP range  Did not tolerate boniva  [place] carrying a wiggly grandchild-no injury No fractures  Sister has hx of bone loss as well    Due for labs -plans to get them last week   bp is stable today  No cp or palpitations or headaches or edema  No side effects to medicines  BP Readings from Last 3 Encounters:  10/09/17 (!) 144/78  12/30/16 (!) 152/88  10/06/16 128/70     takes benicar 20 mg - takes it after lunch   Does acupuncture-last visit 120/78  Patient Active Problem List   Diagnosis Date Noted  . Welcome to Medicare preventive visit 10/06/2016  . Estrogen deficiency 09/18/2015  . Anxiety 04/14/2011  . Hypertension 03/03/2011  . Routine general medical examination at a health care facility 03/03/2011  . DEPRESSION 07/21/2007  . ALLERGIC RHINITIS 07/21/2007  . Osteoporosis 07/21/2007   Past Medical History:  Diagnosis Date  . Allergy    allergic rhinitis  . Gilbert's syndrome    with high bilirubin  . Hypertension   . Osteoporosis    Past Surgical History:  Procedure Laterality Date  . TONSILLECTOMY AND ADENOIDECTOMY  1957   Social History   Tobacco Use  . Smoking status: Former Smoker    Last attempt to quit: 11/10/2000    Years since quitting: 16.9  . Smokeless tobacco: Never Used  Substance Use Topics  . Alcohol use: Yes    Alcohol/week: 0.0 oz    Comment: 2/4 x  weekly  . Drug use: No   Family History  Problem Relation Age of Onset  . Heart disease Mother        heart attack  . Hypertension Mother   . Breast cancer Mother        ages 64 and 30  . Hypertension Father   . Heart disease Maternal Grandfather        MI  . Hypertension Sister   . Osteopenia Sister   . Lung cancer Paternal Uncle        heavy smoker   Allergies  Allergen Reactions  . Boniva [Ibandronic Acid] Nausea Only  . Lisinopril Other (See Comments)    Severe fatigue  . Norvasc [Amlodipine Besylate]     Fatigue/ depression  . Tetanus Toxoid     REACTION: very fatigued for 1 week after   Current Outpatient Medications on File Prior to Visit  Medication Sig Dispense Refill  . Cholecalciferol (VITAMIN D) 2000 UNITS tablet Take 2,000 Units by mouth daily.    . fexofenadine (ALLEGRA ALLERGY CHILDRENS) 30 MG tablet Take 15 mg by mouth daily as needed.    . Multiple Vitamin (MULTIVITAMIN) capsule Take 1 capsule by mouth daily.     Marland Kitchen  vitamin C (ASCORBIC ACID) 500 MG tablet Take 500 mg by mouth daily. As needed     No current facility-administered medications on file prior to visit.     Review of Systems  Constitutional: Negative for activity change, appetite change, fatigue, fever and unexpected weight change.  HENT: Negative for congestion, ear pain, rhinorrhea, sinus pressure and sore throat.   Eyes: Negative for pain, redness and visual disturbance.  Respiratory: Negative for cough, shortness of breath and wheezing.   Cardiovascular: Negative for chest pain and palpitations.  Gastrointestinal: Negative for abdominal pain, blood in stool, constipation and diarrhea.  Endocrine: Negative for polydipsia and polyuria.  Genitourinary: Negative for dysuria, frequency and urgency.  Musculoskeletal: Negative for arthralgias, back pain and myalgias.  Skin: Negative for pallor and rash.  Allergic/Immunologic: Negative for environmental allergies.  Neurological: Negative for  dizziness, syncope and headaches.  Hematological: Negative for adenopathy. Does not bruise/bleed easily.  Psychiatric/Behavioral: Negative for decreased concentration and dysphoric mood. The patient is not nervous/anxious.        Objective:   Physical Exam  Constitutional: She appears well-developed and well-nourished. No distress.  Slim and well app  HENT:  Head: Normocephalic and atraumatic.  Right Ear: External ear normal.  Left Ear: External ear normal.  Nose: Nose normal.  Mouth/Throat: Oropharynx is clear and moist.  Eyes: Conjunctivae and EOM are normal. Pupils are equal, round, and reactive to light. Right eye exhibits no discharge. Left eye exhibits no discharge. No scleral icterus.  Neck: Normal range of motion. Neck supple. No JVD present. Carotid bruit is not present. No thyromegaly present.  Cardiovascular: Normal rate, regular rhythm, normal heart sounds and intact distal pulses. Exam reveals no gallop.  Pulmonary/Chest: Effort normal and breath sounds normal. No respiratory distress. She has no wheezes. She has no rales.  Abdominal: Soft. Bowel sounds are normal. She exhibits no distension and no mass. There is no tenderness.  Genitourinary:  Genitourinary Comments: Breast exam def for gyn  Musculoskeletal: She exhibits no edema or tenderness.  Lymphadenopathy:    She has no cervical adenopathy.  Neurological: She is alert. She has normal reflexes. No cranial nerve deficit. She exhibits normal muscle tone. Coordination normal.  Skin: Skin is warm and dry. No rash noted. No erythema. No pallor.  Solar lentigines diffusely   Baseline birthmark L neck  Psychiatric: She has a normal mood and affect.          Assessment & Plan:   Problem List Items Addressed This Visit      Cardiovascular and Mediastinum   Hypertension - Primary    bp in fair control at this time  BP Readings from Last 1 Encounters:  10/09/17 128/80   No changes needed Disc lifstyle change  with low sodium diet and exercise  Labs ordered for dec as planned with her amw visit       Relevant Medications   olmesartan (BENICAR) 20 MG tablet     Musculoskeletal and Integument   Osteoporosis    dexa 3/17 Followed by gyn  She was intol to boniva -will disc other opt for tx On ca and D  One fall-disc fall prev No fractures Exercises regularly

## 2017-10-15 ENCOUNTER — Other Ambulatory Visit (INDEPENDENT_AMBULATORY_CARE_PROVIDER_SITE_OTHER): Payer: Medicare Other

## 2017-10-15 ENCOUNTER — Telehealth: Payer: Self-pay | Admitting: Family Medicine

## 2017-10-15 ENCOUNTER — Ambulatory Visit: Payer: Medicare Other

## 2017-10-15 DIAGNOSIS — I10 Essential (primary) hypertension: Secondary | ICD-10-CM | POA: Diagnosis not present

## 2017-10-15 LAB — CBC WITH DIFFERENTIAL/PLATELET
BASOS ABS: 0.1 10*3/uL (ref 0.0–0.1)
Basophils Relative: 1 % (ref 0.0–3.0)
Eosinophils Absolute: 0.3 10*3/uL (ref 0.0–0.7)
Eosinophils Relative: 3.5 % (ref 0.0–5.0)
HCT: 39.5 % (ref 36.0–46.0)
Hemoglobin: 13.3 g/dL (ref 12.0–15.0)
LYMPHS ABS: 2.5 10*3/uL (ref 0.7–4.0)
Lymphocytes Relative: 31.9 % (ref 12.0–46.0)
MCHC: 33.7 g/dL (ref 30.0–36.0)
MCV: 94.8 fl (ref 78.0–100.0)
MONO ABS: 0.6 10*3/uL (ref 0.1–1.0)
MONOS PCT: 7.1 % (ref 3.0–12.0)
NEUTROS ABS: 4.4 10*3/uL (ref 1.4–7.7)
NEUTROS PCT: 56.5 % (ref 43.0–77.0)
PLATELETS: 294 10*3/uL (ref 150.0–400.0)
RBC: 4.17 Mil/uL (ref 3.87–5.11)
RDW: 13.1 % (ref 11.5–15.5)
WBC: 7.8 10*3/uL (ref 4.0–10.5)

## 2017-10-15 LAB — COMPREHENSIVE METABOLIC PANEL
ALBUMIN: 4.8 g/dL (ref 3.5–5.2)
ALK PHOS: 58 U/L (ref 39–117)
ALT: 14 U/L (ref 0–35)
AST: 21 U/L (ref 0–37)
BUN: 14 mg/dL (ref 6–23)
CHLORIDE: 98 meq/L (ref 96–112)
CO2: 30 meq/L (ref 19–32)
CREATININE: 0.72 mg/dL (ref 0.40–1.20)
Calcium: 9.8 mg/dL (ref 8.4–10.5)
GFR: 86.08 mL/min (ref >60.00)
Glucose, Bld: 76 mg/dL (ref 70–99)
Potassium: 4 mEq/L (ref 3.5–5.1)
SODIUM: 135 meq/L (ref 135–145)
Total Bilirubin: 1.6 mg/dL — ABNORMAL HIGH (ref 0.2–1.2)
Total Protein: 7 g/dL (ref 6.0–8.3)

## 2017-10-15 LAB — LIPID PANEL
CHOLESTEROL: 191 mg/dL (ref 0–200)
HDL: 103.7 mg/dL (ref >39.00)
LDL CALC: 80 mg/dL (ref 0–99)
NonHDL: 87.77
Total CHOL/HDL Ratio: 2
Triglycerides: 39 mg/dL (ref 0.0–149.0)
VLDL: 7.8 mg/dL (ref 0.0–40.0)

## 2017-10-15 LAB — TSH: TSH: 2 u[IU]/mL (ref 0.35–4.50)

## 2017-10-15 NOTE — Telephone Encounter (Signed)
Please enter AWV lab orders due to Sims out of office.  Pt coming at 1pm

## 2017-10-21 ENCOUNTER — Ambulatory Visit: Payer: Medicare Other

## 2017-12-28 ENCOUNTER — Ambulatory Visit (INDEPENDENT_AMBULATORY_CARE_PROVIDER_SITE_OTHER): Payer: Medicare Other | Admitting: Obstetrics & Gynecology

## 2017-12-28 ENCOUNTER — Encounter: Payer: Self-pay | Admitting: Obstetrics & Gynecology

## 2017-12-28 VITALS — BP 146/96 | HR 88 | Resp 16 | Ht 63.5 in | Wt 114.0 lb

## 2017-12-28 DIAGNOSIS — Z124 Encounter for screening for malignant neoplasm of cervix: Secondary | ICD-10-CM

## 2017-12-28 DIAGNOSIS — Z01419 Encounter for gynecological examination (general) (routine) without abnormal findings: Secondary | ICD-10-CM | POA: Diagnosis not present

## 2017-12-28 NOTE — Progress Notes (Signed)
67 y.o. [id number] DivorcedCaucasianF here for annual exam.  Doing well.  No vaginal bleeding.  Has two grandsons.  They live in [place].    Patient's last menstrual period was 11/10/2002.          Sexually active: Yes.    The current method of family planning is post menopausal status.    Exercising: Yes.    interval walking, running, push ups, sit ups Smoker:  no  Health Maintenance: Pap:  06/17/16 Neg. HR HPV:neg   08/29/13 Neg. HR HPV:neg  History of abnormal Pap:  no MMG:  08/11/17 BIRADS1:Neg  Colonoscopy:  2009 normal .  Pt aware this is due.   BMD:   01/20/16 Osteoporosis  TDaP:  2014  Pneumonia vaccine(s):  2018 Shingrix:  Zostavax 2014 Hep C testing: 07/01/16 Neg  Screening Labs: PCP   reports that she quit smoking about 17 years ago. she has never used smokeless tobacco. She reports that she drinks alcohol. She reports that she does not use drugs.  Past Medical History:  Diagnosis Date  . Allergy    allergic rhinitis  . Gilbert's syndrome    with high bilirubin  . Hypertension   . Osteoporosis     Past Surgical History:  Procedure Laterality Date  . TONSILLECTOMY AND ADENOIDECTOMY  1957    Current Outpatient Medications  Medication Sig Dispense Refill  . Cholecalciferol (VITAMIN D) 2000 UNITS tablet Take 2,000 Units by mouth daily.    . fexofenadine (ALLEGRA ALLERGY CHILDRENS) 30 MG tablet Take 15 mg by mouth daily as needed.    . Multiple Vitamin (MULTIVITAMIN) capsule Take 1 capsule by mouth daily.     Marland Kitchen olmesartan (BENICAR) 20 MG tablet Take 1 tablet (20 mg total) by mouth daily. 30 tablet 11  . vitamin C (ASCORBIC ACID) 500 MG tablet Take 500 mg by mouth daily. As needed     No current facility-administered medications for this visit.     Family History  Problem Relation Age of Onset  . Heart disease Mother        heart attack  . Hypertension Mother   . Breast cancer Mother        ages 10 and 63  . Hypertension Father   . Heart disease Maternal  Grandfather        MI  . Hypertension Sister   . Osteopenia Sister   . Lung cancer Paternal Uncle        heavy smoker    ROS:  Pertinent items are noted in HPI.  Otherwise, a comprehensive ROS was negative.  Exam:   BP (!) 140/98 (BP Location: Left Arm, Patient Position: Sitting, Cuff Size: Normal)   Pulse 88   Resp 16   Ht 5' 3.5" (1.613 m)   Wt 114 lb (51.7 kg)   LMP 11/10/2002   BMI 19.88 kg/m   Weight change: stable   Height: 5' 3.5" (161.3 cm)  Ht Readings from Last 3 Encounters:  12/28/17 5' 3.5" (1.613 m)  10/09/17 5' 3.25" (1.607 m)  12/30/16 5\' 4"  (1.626 m)    General appearance: alert, cooperative and appears stated age Head: Normocephalic, without obvious abnormality, atraumatic Neck: no adenopathy, supple, symmetrical, trachea midline and thyroid normal to inspection and palpation Lungs: clear to auscultation bilaterally Breasts: normal appearance, no masses or tenderness Heart: regular rate and rhythm Abdomen: soft, non-tender; bowel sounds normal; no masses,  no organomegaly Extremities: extremities normal, atraumatic, no cyanosis or edema Skin: Skin color, texture, turgor normal.  No rashes or lesions Lymph nodes: Cervical, supraclavicular, and axillary nodes normal. No abnormal inguinal nodes palpated Neurologic: Grossly normal   Pelvic: External genitalia:  no lesions              Urethra:  normal appearing urethra with no masses, tenderness or lesions              Bartholins and Skenes: normal                 Vagina: normal appearing vagina with normal color and discharge, no lesions              Cervix: no lesions              Pap taken: No. Bimanual Exam:  Uterus:  normal size, contour, position, consistency, mobility, non-tender              Adnexa: normal adnexa and no mass, fullness, tenderness               Rectovaginal: Confirms               Anus:  normal sphincter tone, no lesions  Chaperone was present for exam.  A:  Well Woman with  normal exam PMP, no HRT Osteoporosis, used fosamax previously Family hx of breast cancer in mother (with 2 primaries).  Has declines genetic testing.  Sees dermatology yearly (Dr. Derrel Nip)  P:   Mammogram yearly.  Doing 3D MMg. pap smear and HR HPV today obtained 8/17 Colonoscopy  Due later this year Labs UTD with Dr. Glori Bickers return annually or prn

## 2017-12-28 NOTE — Patient Instructions (Signed)
[facility]--Dr. Silvano Rusk.

## 2018-01-28 DIAGNOSIS — D1801 Hemangioma of skin and subcutaneous tissue: Secondary | ICD-10-CM | POA: Diagnosis not present

## 2018-01-28 DIAGNOSIS — D2271 Melanocytic nevi of right lower limb, including hip: Secondary | ICD-10-CM | POA: Diagnosis not present

## 2018-01-28 DIAGNOSIS — L814 Other melanin hyperpigmentation: Secondary | ICD-10-CM | POA: Diagnosis not present

## 2018-01-28 DIAGNOSIS — L821 Other seborrheic keratosis: Secondary | ICD-10-CM | POA: Diagnosis not present

## 2018-03-24 DIAGNOSIS — H43813 Vitreous degeneration, bilateral: Secondary | ICD-10-CM | POA: Diagnosis not present

## 2018-03-24 DIAGNOSIS — H524 Presbyopia: Secondary | ICD-10-CM | POA: Diagnosis not present

## 2018-03-24 DIAGNOSIS — H25813 Combined forms of age-related cataract, bilateral: Secondary | ICD-10-CM | POA: Diagnosis not present

## 2018-07-15 ENCOUNTER — Other Ambulatory Visit: Payer: Self-pay | Admitting: Obstetrics & Gynecology

## 2018-07-15 DIAGNOSIS — Z1231 Encounter for screening mammogram for malignant neoplasm of breast: Secondary | ICD-10-CM

## 2018-07-16 ENCOUNTER — Telehealth: Payer: Self-pay | Admitting: Genetic Counselor

## 2018-07-16 NOTE — Telephone Encounter (Signed)
Pt cld wanting to schedule a genetic counseling appt with Karen Rice for further testing. Pt has been sheduled to see Santiago Glad on 10/7 at 2pm.

## 2018-08-16 ENCOUNTER — Inpatient Hospital Stay: Payer: Medicare Other | Attending: Genetic Counselor | Admitting: Genetic Counselor

## 2018-08-16 ENCOUNTER — Inpatient Hospital Stay: Payer: Medicare Other

## 2018-08-16 ENCOUNTER — Encounter: Payer: Self-pay | Admitting: Genetic Counselor

## 2018-08-16 DIAGNOSIS — Z8049 Family history of malignant neoplasm of other genital organs: Secondary | ICD-10-CM | POA: Diagnosis not present

## 2018-08-16 DIAGNOSIS — Z803 Family history of malignant neoplasm of breast: Secondary | ICD-10-CM | POA: Diagnosis not present

## 2018-08-16 NOTE — Progress Notes (Signed)
REFERRING PROVIDER: Megan Salon, MD [place], [place] [address]  PRIMARY PROVIDER:  Abner Greenspan, MD  PRIMARY REASON FOR VISIT:  1. Family history of breast cancer   2. Family history of uterine cancer      HISTORY OF PRESENT ILLNESS:   Karen Rice, a 67 y.o. female, was seen for a [facility] cancer genetics consultation at the request of Dr. Sabra Rice due to a family history of cancer.  Karen Rice presents to clinic today to discuss the possibility of a hereditary predisposition to cancer, genetic testing, and to further clarify her future cancer risks, as well as potential cancer risks for family members.  Karen Rice is a 67 y.o. female with no personal history of cancer.    CANCER HISTORY:   No history exists.     HORMONAL RISK FACTORS:  Menarche was at age 75.  First live birth at age 29.  OCP use for approximately 0 years.  Ovaries intact: yes.  Hysterectomy: no.  Menopausal status: postmenopausal.  HRT use: 0 years. Colonoscopy: yes; normal. Mammogram within the last year: yes. Number of breast biopsies: 0. Up to date with pelvic exams:  yes. Any excessive radiation exposure in the past:  no  Past Medical History:  Diagnosis Date  . Allergy    allergic rhinitis  . Family history of breast cancer   . Family history of uterine cancer   . Gilbert's syndrome    with high bilirubin  . Hypertension   . Osteoporosis     Past Surgical History:  Procedure Laterality Date  . TONSILLECTOMY AND ADENOIDECTOMY  1957    Social History   Socioeconomic History  . Marital status: Divorced    Spouse name: Not on file  . Number of children: Not on file  . Years of education: Not on file  . Highest education level: Not on file  Occupational History  . Not on file  Social Needs  . Financial resource strain: Not on file  . Food insecurity:    Worry: Not on file    Inability: Not on file  . Transportation needs:    Medical: Not on file   Non-medical: Not on file  Tobacco Use  . Smoking status: Former Smoker    Last attempt to quit: 11/10/2000    Years since quitting: 17.7  . Smokeless tobacco: Never Used  Substance and Sexual Activity  . Alcohol use: Yes    Alcohol/week: 0.0 standard drinks    Comment: 2/4 x weekly  . Drug use: No  . Sexual activity: Yes    Partners: Female    Birth control/protection: Post-menopausal  Lifestyle  . Physical activity:    Days per week: Not on file    Minutes per session: Not on file  . Stress: Not on file  Relationships  . Social connections:    Talks on phone: Not on file    Gets together: Not on file    Attends religious service: Not on file    Active member of club or organization: Not on file    Attends meetings of clubs or organizations: Not on file    Relationship status: Not on file  Other Topics Concern  . Not on file  Social History Narrative  . Not on file     FAMILY HISTORY:  We obtained a detailed, 4-generation family history.  Significant diagnoses are listed below: Family History  Problem Relation Age of Onset  . Heart  disease Mother        heart attack  . Hypertension Mother   . Breast cancer Mother        ages 34 and 35  . Hypertension Father   . Heart disease Maternal Grandfather        MI  . Hypertension Sister   . Osteopenia Sister   . Lung cancer Paternal Uncle        heavy smoker  . Heart disease Maternal Aunt   . Dementia Paternal Grandmother   . Uterine cancer Cousin        dx in her 23s  . Uterine cancer Other        MGM's maternal aunt    The patient has two sons and a daughter who are cancer free.  She has one sister who is healthy and cancer free.  Both parents are deceased.  The patient's mother had breast cancer at 33 and 75.  She died at 65 from a heart attack.  She had one sister who died of heart problems.  The maternal grandparents are both deceased.  The grandmother had a maternal aunt with possibly uterine cancer.  The  patient's father died of polyarteritis nodosum at 21.  He had a brother and sister.  The brother had lung cancer.  The sister is alive at 26 and had a daughter with possible uterine cancer.  The paternal grandparents are deceased from old age.  Ms. 04/12/2023 is unaware of previous family history of genetic testing for hereditary cancer risks. Patient's maternal ancestors are of English descent, and paternal ancestors are of Irish/German descent. There is no reported Ashkenazi Jewish ancestry. There is no known consanguinity.  GENETIC COUNSELING ASSESSMENT: Karen Rice is a 67 y.o. female with a family history of breast cancer which is somewhat suggestive of a hereditary cancer syndrome and predisposition to cancer. We, therefore, discussed and recommended the following at today's visit.   DISCUSSION: We discussed that about 5-10% of breast cancer is hereditary with most cases due to BRCA mutations.  There are other genes associated with hereditary breast cancer genes, including ATM, CHEK2 and PALB2.  We reviewed the characteristics, features and inheritance patterns of hereditary cancer syndromes. We also discussed genetic testing, including the appropriate family members to test, the process of testing, insurance coverage and turn-around-time for results. We discussed the implications of a negative, positive and/or variant of uncertain significant result. We recommended Ms. Karen Rice pursue genetic testing for the common hereditary cancer gene panel. The Hereditary Gene Panel offered by [facility] includes sequencing and/or deletion duplication testing of the following 47 genes: APC, ATM, AXIN2, BARD1, BMPR1A, BRCA1, BRCA2, BRIP1, CDH1, CDK4, CDKN2A (p14ARF), CDKN2A (p16INK4a), CHEK2, CTNNA1, DICER1, EPCAM (Deletion/duplication testing only), GREM1 (promoter region deletion/duplication testing only), KIT, MEN1, MLH1, MSH2, MSH3, MSH6, MUTYH, NBN, NF1, NHTL1, PALB2, PDGFRA, PMS2, POLD1, POLE, PTEN, RAD50, RAD51C, RAD51D, SDHB,  SDHC, SDHD, SMAD4, SMARCA4. STK11, TP53, TSC1, TSC2, and VHL.  The following genes were evaluated for sequence changes only: SDHA and HOXB13 c.251G>A variant only.   Based on Ms. 40 family history of cancer, she meets medical criteria for genetic testing. Despite that she meets criteria, she Karen Rice still have an out of pocket cost. We discussed that if her out of pocket cost for testing is over $100, the laboratory will call and confirm whether she wants to proceed with testing.  If the out of pocket cost of testing is less than $100 she will be billed by the genetic  testing laboratory.   PLAN: After considering the risks, benefits, and limitations, Ms. Karen Rice  provided informed consent to pursue genetic testing and the blood sample was sent to [facility] for analysis of the common hereditary cancer panel. Results should be available within approximately 2-3 weeks' time, at which point they will be disclosed by telephone to Ms. Karen Rice, as will any additional recommendations warranted by these results. Ms. Karen Rice will receive a summary of her genetic counseling visit and a copy of her results once available. This information will also be available in Epic. We encouraged Ms. Karen Rice to remain in contact with cancer genetics annually so that we can continuously update the family history and inform her of any changes in cancer genetics and testing that Karen Rice be of benefit for her family. Ms. Karen Rice questions were answered to her satisfaction today. Our contact information was provided should additional questions or concerns arise.  Lastly, we encouraged Ms. Karen Rice to remain in contact with cancer genetics annually so that we can continuously update the family history and inform her of any changes in cancer genetics and testing that Karen Rice be of benefit for this family.   Ms.  Karen Rice questions were answered to her satisfaction today. Our contact information was provided should additional questions or concerns arise. Thank you  for the referral and allowing [place] to share in the care of your patient.   Chaslyn Karen Rice P. Florene Glen, [place], [facility] Certified Genetic Counselor [email or web address] phone: [phone]  The patient was seen for a total of 30 minutes in face-to-face genetic counseling.  This patient was discussed with Drs. Magrinat, Lindi Adie and/or Burr Medico who agrees with the above.    _______________________________________________________________________ For Office Staff:  Number of people involved in session: 1 Was an Intern/ student involved with case: no

## 2018-08-17 ENCOUNTER — Ambulatory Visit
Admission: RE | Admit: 2018-08-17 | Discharge: 2018-08-17 | Disposition: A | Discharge status: Home / Self Care | Payer: Medicare Other | Source: Ambulatory Visit | Attending: Obstetrics & Gynecology | Admitting: Obstetrics & Gynecology

## 2018-08-17 DIAGNOSIS — Z1231 Encounter for screening mammogram for malignant neoplasm of breast: Secondary | ICD-10-CM | POA: Diagnosis not present

## 2018-08-26 ENCOUNTER — Encounter: Payer: Self-pay | Admitting: Genetic Counselor

## 2018-08-26 ENCOUNTER — Telehealth: Payer: Self-pay | Admitting: Genetic Counselor

## 2018-08-26 DIAGNOSIS — Z1379 Encounter for other screening for genetic and chromosomal anomalies: Secondary | ICD-10-CM | POA: Insufficient documentation

## 2018-08-26 NOTE — Telephone Encounter (Signed)
Revealed negative genetic testing.  Discussed that we do not know why there is cancer in the family. It could be due to a different gene that we are not testing, or maybe our current technology Stucker not be able to pick something up.  It will be important for her to keep in contact with genetics to keep up with whether additional testing Gioffre be needed.  

## 2018-09-03 ENCOUNTER — Ambulatory Visit: Payer: Self-pay | Admitting: Genetic Counselor

## 2018-09-03 DIAGNOSIS — Z1379 Encounter for other screening for genetic and chromosomal anomalies: Secondary | ICD-10-CM

## 2018-09-03 NOTE — Progress Notes (Signed)
HPI:  Ms. 2023/04/13 was previously seen in the [place] clinic due to a family history of cancer and concerns regarding a hereditary predisposition to cancer. Please refer to our prior cancer genetics clinic note for more information regarding Ms. Araki's medical, social and family histories, and our assessment and recommendations, at the time. Ms. Regner recent genetic test results were disclosed to her, as were recommendations warranted by these results. These results and recommendations are discussed in more detail below.  CANCER HISTORY:   No history exists.    FAMILY HISTORY:  We obtained a detailed, 4-generation family history.  Significant diagnoses are listed below: Family History  Problem Relation Age of Onset  . Heart disease Mother        heart attack  . Hypertension Mother   . Breast cancer Mother        ages 95 and 36  . Hypertension Father   . Heart disease Maternal Grandfather        MI  . Hypertension Sister   . Osteopenia Sister   . Lung cancer Paternal Uncle        heavy smoker  . Heart disease Maternal Aunt   . Dementia Paternal Grandmother   . Uterine cancer Cousin        dx in her 65s  . Uterine cancer Other        MGM's maternal aunt    The patient has two sons and a daughter who are cancer free.  She has one sister who is healthy and cancer free.  Both parents are deceased.  The patient's mother had breast cancer at 33 and 81.  She died at 33 from a heart attack.  She had one sister who died of heart problems.  The maternal grandparents are both deceased.  The grandmother had a maternal aunt with possibly uterine cancer.  The patient's father died of polyarteritis nodosum at 25.  He had a brother and sister.  The brother had lung cancer.  The sister is alive at 1 and had a daughter with possible uterine cancer.  The paternal grandparents are deceased from old age.  Ms. 2023-04-13 is unaware of previous family history of genetic testing for hereditary  cancer risks. Patient's maternal ancestors are of English descent, and paternal ancestors are of Irish/German descent. There is no reported Ashkenazi Jewish ancestry. There is no known consanguinity.  GENETIC TEST RESULTS: Genetic testing reported out on August 25, 2018 through the common hereditary cancer panel found no deleterious mutations. The Hereditary Gene Panel offered by [facility] includes sequencing and/or deletion duplication testing of the following 47 genes: APC, ATM, AXIN2, BARD1, BMPR1A, BRCA1, BRCA2, BRIP1, CDH1, CDK4, CDKN2A (p14ARF), CDKN2A (p16INK4a), CHEK2, CTNNA1, DICER1, EPCAM (Deletion/duplication testing only), GREM1 (promoter region deletion/duplication testing only), KIT, MEN1, MLH1, MSH2, MSH3, MSH6, MUTYH, NBN, NF1, NHTL1, PALB2, PDGFRA, PMS2, POLD1, POLE, PTEN, RAD50, RAD51C, RAD51D, SDHB, SDHC, SDHD, SMAD4, SMARCA4. STK11, TP53, TSC1, TSC2, and VHL.  The following genes were evaluated for sequence changes only: SDHA and HOXB13 c.251G>A variant only.  The test report has been scanned into EPIC and is located under the Molecular Pathology section of the Results Review tab.    We discussed with Ms. Hamberger that since the current genetic testing is not perfect, it is possible there Homeyer be a gene mutation in one of these genes that current testing cannot detect, but that chance is small.  We also discussed, that it is possible that another gene that has  not yet been discovered, or that we have not yet tested, is responsible for the cancer diagnoses in the family, and it is, therefore, important to remain in touch with cancer genetics in the future so that we can continue to offer Ms. Ellingsen the most up to date genetic testing.    CANCER SCREENING RECOMMENDATIONS: This normal result is reassuring and indicates that Ms. Blanchet does not likely have an increased risk of cancer due to a mutation in one of these genes.  We, therefore, recommended  Ms. Chisenhall continue to follow the cancer screening  guidelines provided by her primary healthcare providers.   An individual's cancer risk and medical management are not determined by genetic test results alone. Overall cancer risk assessment incorporates additional factors, including personal medical history, family history, and any available genetic information that Granillo result in a personalized plan for cancer prevention and surveillance.  Based on Ms. 45 personal and family history of cancer, as well as her genetic test results, statistical models Harriett Rush)  and literature data were used to estimate her risk of developing breast cancer. These estimate her lifetime risk of developing breast cancer to be approximately 10.4%.  The patient's lifetime breast cancer risk is a preliminary estimate based on available information using one of several models endorsed by the [place] (ACS). The ACS recommends consideration of breast MRI screening as an adjunct to mammography for patients at high risk (defined as 20% or greater lifetime risk). A more detailed breast cancer risk assessment can be considered, if clinically indicated.   RECOMMENDATIONS FOR FAMILY MEMBERS:  Women in this family might be at some increased risk of developing cancer, over the general population risk, simply due to the family history of cancer.  We recommended women in this family have a yearly mammogram beginning at age 11, or 50 years younger than the earliest onset of cancer, an annual clinical breast exam, and perform monthly breast self-exams. Women in this family should also have a gynecological exam as recommended by their primary provider. All family members should have a colonoscopy by age 37.  FOLLOW-UP: Lastly, we discussed with Ms. Puig that cancer genetics is a rapidly advancing field and it is possible that new genetic tests will be appropriate for her and/or her family members in the future. We encouraged her to remain in contact with cancer genetics on an  annual basis so we can update her personal and family histories and let her know of advances in cancer genetics that Brenn benefit this family.   Our contact number was provided. Ms. Maslin questions were answered to her satisfaction, and she knows she is welcome to call us at anytime with additional questions or concerns.   Roma Kayser, MS, [facility] Certified Genetic Counselor [email or web address]

## 2018-09-07 ENCOUNTER — Ambulatory Visit (INDEPENDENT_AMBULATORY_CARE_PROVIDER_SITE_OTHER): Payer: Medicare Other

## 2018-09-07 DIAGNOSIS — Z23 Encounter for immunization: Secondary | ICD-10-CM | POA: Diagnosis not present

## 2018-09-24 ENCOUNTER — Other Ambulatory Visit: Payer: Self-pay

## 2018-09-27 ENCOUNTER — Other Ambulatory Visit: Payer: Self-pay | Admitting: Family Medicine

## 2018-10-18 ENCOUNTER — Telehealth: Payer: Self-pay | Admitting: Family Medicine

## 2018-10-18 DIAGNOSIS — I10 Essential (primary) hypertension: Secondary | ICD-10-CM

## 2018-10-18 DIAGNOSIS — M810 Age-related osteoporosis without current pathological fracture: Secondary | ICD-10-CM

## 2018-10-18 NOTE — Telephone Encounter (Signed)
-----   Message from Lendon Collar, RT sent at 10/18/2018  2:56 PM EST ----- Regarding: Lab orders for tomorrow 10/19/18 Please enter CPE lab orders for tomorrow 10/19/18. Thanks!

## 2018-10-19 ENCOUNTER — Other Ambulatory Visit (INDEPENDENT_AMBULATORY_CARE_PROVIDER_SITE_OTHER): Payer: Medicare Other

## 2018-10-19 ENCOUNTER — Ambulatory Visit: Payer: Medicare Other

## 2018-10-19 DIAGNOSIS — I10 Essential (primary) hypertension: Secondary | ICD-10-CM | POA: Diagnosis not present

## 2018-10-19 DIAGNOSIS — M810 Age-related osteoporosis without current pathological fracture: Secondary | ICD-10-CM

## 2018-10-19 LAB — COMPREHENSIVE METABOLIC PANEL
ALT: 9 U/L (ref 0–35)
AST: 14 U/L (ref 0–37)
Albumin: 4.7 g/dL (ref 3.5–5.2)
Alkaline Phosphatase: 45 U/L (ref 39–117)
BUN: 18 mg/dL (ref 6–23)
CO2: 29 mEq/L (ref 19–32)
Calcium: 9.8 mg/dL (ref 8.4–10.5)
Chloride: 101 mEq/L (ref 96–112)
Creatinine, Ser: 0.72 mg/dL (ref 0.40–1.20)
GFR: 85.82 mL/min (ref >60.00)
GLUCOSE: 98 mg/dL (ref 70–99)
Potassium: 4 mEq/L (ref 3.5–5.1)
Sodium: 137 mEq/L (ref 135–145)
Total Bilirubin: 1.8 mg/dL — ABNORMAL HIGH (ref 0.2–1.2)
Total Protein: 7.1 g/dL (ref 6.0–8.3)

## 2018-10-19 LAB — CBC WITH DIFFERENTIAL/PLATELET
BASOS ABS: 0 10*3/uL (ref 0.0–0.1)
Basophils Relative: 1 % (ref 0.0–3.0)
Eosinophils Absolute: 0.1 10*3/uL (ref 0.0–0.7)
Eosinophils Relative: 2.6 % (ref 0.0–5.0)
HCT: 41.4 % (ref 36.0–46.0)
Hemoglobin: 14 g/dL (ref 12.0–15.0)
LYMPHS ABS: 1.6 10*3/uL (ref 0.7–4.0)
Lymphocytes Relative: 35.7 % (ref 12.0–46.0)
MCHC: 33.9 g/dL (ref 30.0–36.0)
MCV: 94.1 fl (ref 78.0–100.0)
MONOS PCT: 9.6 % (ref 3.0–12.0)
Monocytes Absolute: 0.4 10*3/uL (ref 0.1–1.0)
Neutro Abs: 2.4 10*3/uL (ref 1.4–7.7)
Neutrophils Relative %: 51.1 % (ref 43.0–77.0)
Platelets: 298 10*3/uL (ref 150.0–400.0)
RBC: 4.41 Mil/uL (ref 3.87–5.11)
RDW: 13.4 % (ref 11.5–15.5)
WBC: 4.6 10*3/uL (ref 4.0–10.5)

## 2018-10-19 LAB — LIPID PANEL
CHOL/HDL RATIO: 2
CHOLESTEROL: 192 mg/dL (ref 0–200)
HDL: 101.6 mg/dL (ref >39.00)
LDL CALC: 80 mg/dL (ref 0–99)
NonHDL: 90.24
Triglycerides: 51 mg/dL (ref 0.0–149.0)
VLDL: 10.2 mg/dL (ref 0.0–40.0)

## 2018-10-19 LAB — VITAMIN D 25 HYDROXY (VIT D DEFICIENCY, FRACTURES): VITD: 36.95 ng/mL (ref 30.00–100.00)

## 2018-10-19 LAB — TSH: TSH: 1.36 u[IU]/mL (ref 0.35–4.50)

## 2018-10-20 ENCOUNTER — Other Ambulatory Visit: Payer: Medicare Other

## 2018-10-22 ENCOUNTER — Ambulatory Visit: Payer: Medicare Other

## 2018-10-25 ENCOUNTER — Ambulatory Visit: Payer: Medicare Other | Admitting: Family Medicine

## 2018-10-29 ENCOUNTER — Encounter (HOSPITAL_COMMUNITY): Payer: Self-pay

## 2018-10-29 ENCOUNTER — Emergency Department (HOSPITAL_COMMUNITY)
Admission: EM | Admit: 2018-10-29 | Discharge: 2018-10-30 | Disposition: A | Discharge status: Home / Self Care | Payer: Medicare Other | Attending: Emergency Medicine | Admitting: Emergency Medicine

## 2018-10-29 ENCOUNTER — Encounter: Payer: Self-pay | Admitting: Family Medicine

## 2018-10-29 ENCOUNTER — Encounter: Payer: Medicare Other | Admitting: Family Medicine

## 2018-10-29 ENCOUNTER — Other Ambulatory Visit: Payer: Medicare Other

## 2018-10-29 ENCOUNTER — Emergency Department (HOSPITAL_COMMUNITY): Payer: Medicare Other

## 2018-10-29 ENCOUNTER — Telehealth: Payer: Self-pay

## 2018-10-29 ENCOUNTER — Ambulatory Visit (INDEPENDENT_AMBULATORY_CARE_PROVIDER_SITE_OTHER): Payer: Medicare Other | Admitting: Family Medicine

## 2018-10-29 ENCOUNTER — Other Ambulatory Visit: Payer: Self-pay

## 2018-10-29 VITALS — BP 132/85 | HR 84 | Temp 98.0°F | Ht 63.5 in | Wt 112.0 lb

## 2018-10-29 DIAGNOSIS — K5730 Diverticulosis of large intestine without perforation or abscess without bleeding: Secondary | ICD-10-CM | POA: Diagnosis not present

## 2018-10-29 DIAGNOSIS — K529 Noninfective gastroenteritis and colitis, unspecified: Secondary | ICD-10-CM | POA: Diagnosis not present

## 2018-10-29 DIAGNOSIS — R1084 Generalized abdominal pain: Secondary | ICD-10-CM | POA: Diagnosis not present

## 2018-10-29 DIAGNOSIS — Z79899 Other long term (current) drug therapy: Secondary | ICD-10-CM | POA: Insufficient documentation

## 2018-10-29 DIAGNOSIS — I10 Essential (primary) hypertension: Secondary | ICD-10-CM | POA: Insufficient documentation

## 2018-10-29 DIAGNOSIS — K625 Hemorrhage of anus and rectum: Secondary | ICD-10-CM

## 2018-10-29 DIAGNOSIS — Z87891 Personal history of nicotine dependence: Secondary | ICD-10-CM | POA: Diagnosis not present

## 2018-10-29 DIAGNOSIS — R197 Diarrhea, unspecified: Secondary | ICD-10-CM | POA: Diagnosis not present

## 2018-10-29 LAB — URINALYSIS, ROUTINE W REFLEX MICROSCOPIC
Bacteria, UA: NONE SEEN
Bilirubin Urine: NEGATIVE
Glucose, UA: NEGATIVE mg/dL
Ketones, ur: 5 mg/dL — AB
Nitrite: NEGATIVE
PROTEIN: NEGATIVE mg/dL
Specific Gravity, Urine: 1.034 — ABNORMAL HIGH (ref 1.005–1.030)
pH: 6 (ref 5.0–8.0)

## 2018-10-29 LAB — COMPREHENSIVE METABOLIC PANEL
ALK PHOS: 43 U/L (ref 38–126)
ALT: 16 U/L (ref 0–44)
AST: 22 U/L (ref 15–41)
Albumin: 4.8 g/dL (ref 3.5–5.0)
Anion gap: 9 (ref 5–15)
BUN: 18 mg/dL (ref 8–23)
CO2: 24 mmol/L (ref 22–32)
Calcium: 9.4 mg/dL (ref 8.9–10.3)
Chloride: 99 mmol/L (ref 98–111)
Creatinine, Ser: 0.59 mg/dL (ref 0.44–1.00)
GFR calc Af Amer: >60 mL/min (ref >60)
GFR calc non Af Amer: >60 mL/min (ref >60)
Glucose, Bld: 106 mg/dL — ABNORMAL HIGH (ref 70–99)
Potassium: 3.2 mmol/L — ABNORMAL LOW (ref 3.5–5.1)
Sodium: 132 mmol/L — ABNORMAL LOW (ref 135–145)
Total Bilirubin: 2.9 mg/dL — ABNORMAL HIGH (ref 0.3–1.2)
Total Protein: 6.9 g/dL (ref 6.5–8.1)

## 2018-10-29 LAB — CBC
HCT: 39.3 % (ref 36.0–46.0)
Hemoglobin: 13.2 g/dL (ref 12.0–15.0)
MCH: 32.1 pg (ref 26.0–34.0)
MCHC: 33.6 g/dL (ref 30.0–36.0)
MCV: 95.6 fL (ref 80.0–100.0)
PLATELETS: 277 10*3/uL (ref 150–400)
RBC: 4.11 MIL/uL (ref 3.87–5.11)
RDW: 12.6 % (ref 11.5–15.5)
WBC: 13.8 10*3/uL — ABNORMAL HIGH (ref 4.0–10.5)
nRBC: 0 % (ref 0.0–0.2)

## 2018-10-29 LAB — DIFFERENTIAL
Abs Immature Granulocytes: 0.05 10*3/uL (ref 0.00–0.07)
Basophils Absolute: 0.1 10*3/uL (ref 0.0–0.1)
Basophils Relative: 0 %
Eosinophils Absolute: 0 10*3/uL (ref 0.0–0.5)
Eosinophils Relative: 0 %
Immature Granulocytes: 0 %
Lymphocytes Relative: 13 %
Lymphs Abs: 1.7 10*3/uL (ref 0.7–4.0)
Monocytes Absolute: 0.9 10*3/uL (ref 0.1–1.0)
Monocytes Relative: 7 %
Neutro Abs: 11 10*3/uL — ABNORMAL HIGH (ref 1.7–7.7)
Neutrophils Relative %: 80 %

## 2018-10-29 LAB — TYPE AND SCREEN
ABO/RH(D): O POS
Antibody Screen: NEGATIVE

## 2018-10-29 MED ORDER — METRONIDAZOLE 500 MG PO TABS: 500.0000 mg | ORAL_TABLET | Freq: Three times a day (TID) | ORAL | 0 refills | Status: DC

## 2018-10-29 MED ORDER — POTASSIUM CHLORIDE CRYS ER 20 MEQ PO TBCR
40.0000 meq | EXTENDED_RELEASE_TABLET | Freq: Once | ORAL | Status: AC
  Administered 2018-10-29: 40 meq via ORAL
  Filled 2018-10-29: qty 2

## 2018-10-29 MED ORDER — IOPAMIDOL (ISOVUE-300) INJECTION 61%
INTRAVENOUS | Status: AC
  Filled 2018-10-29: qty 100

## 2018-10-29 MED ORDER — CIPROFLOXACIN HCL 500 MG PO TABS
500.0000 mg | ORAL_TABLET | Freq: Once | ORAL | Status: AC
  Administered 2018-10-29: 500 mg via ORAL
  Filled 2018-10-29: qty 1

## 2018-10-29 MED ORDER — CIPROFLOXACIN HCL 500 MG PO TABS: 500.0000 mg | ORAL_TABLET | Freq: Two times a day (BID) | ORAL | 0 refills | Status: DC

## 2018-10-29 MED ORDER — SODIUM CHLORIDE (PF) 0.9 % IJ SOLN
INTRAMUSCULAR | Status: AC
  Filled 2018-10-29: qty 50

## 2018-10-29 MED ORDER — METRONIDAZOLE 500 MG PO TABS
500.0000 mg | ORAL_TABLET | Freq: Once | ORAL | Status: AC
  Administered 2018-10-29: 500 mg via ORAL
  Filled 2018-10-29: qty 1

## 2018-10-29 MED ORDER — IOPAMIDOL (ISOVUE-300) INJECTION 61%
100.0000 mL | Freq: Once | INTRAVENOUS | Status: AC | PRN
  Administered 2018-10-29: 100 mL via INTRAVENOUS

## 2018-10-29 NOTE — Discharge Instructions (Addendum)
Take the prescriptions as directed.  Eat liquid diet for the next few days, then advance slowly to a bland diet, and then to your regular diet slowly as you can tolerate it.  Call your regular medical doctor Monday to schedule a follow up appointment in the next 3 days. Return to the Emergency Department immediately sooner if worsening.

## 2018-10-29 NOTE — Telephone Encounter (Signed)
Since 5 AM this morning had 4 loose diarrhea stools and last time had red streaks in commode. On and off cramping in lower abd. No fever, N&V. Pt has appt to see Dr Glori Bickers 10/29/18 at 12:30.

## 2018-10-29 NOTE — ED Notes (Addendum)
Pt ambulatory to restroom with steady gait. Pt able to provide urine sample but unable to provide stool sample

## 2018-10-29 NOTE — Telephone Encounter (Signed)
I will see her then  

## 2018-10-29 NOTE — Patient Instructions (Signed)
Before you start to eat make sure you tolerate fluids  Do not get dehydrated   Then = gradually try BRAT (bananas, rice , apple sauce and toast)  Then advance diet slowly  Watch for fever or abdominal pain or other symptoms  Any signs of dehydration (dry mouth/rapid heart beat or dizziness)   Keep [place] posted We will do a c diff test

## 2018-10-29 NOTE — Assessment & Plan Note (Signed)
Somewhat brief this am - now much improved Pt has seen mucous with some blood however (no rectal pain) Stool heme pos today  Reassuring exam In light of recent hospital exposure- c diff test ordered  Enc fluids Then BRAT diet -adv as tolerated  inst to alert [place] if worse or any new symptoms  Rev s/s of dehydration as well

## 2018-10-29 NOTE — ED Notes (Signed)
Patient transported to CT 

## 2018-10-29 NOTE — ED Notes (Addendum)
Pt attempted to give urine and stool sample but unable. Small amount of stool too small for sample but noted to be grossly bloody and red.

## 2018-10-29 NOTE — ED Notes (Signed)
EDP at bedside  

## 2018-10-29 NOTE — Assessment & Plan Note (Signed)
From recent diarrhea Reassuring exam  c diff test ordered (has been visiting in hospital) No rectal pain /nl exam  Update if this returns   Also due for screening colonoscopy when feeling better

## 2018-10-29 NOTE — Assessment & Plan Note (Signed)
bp better on 2nd check BP: 132/85

## 2018-10-29 NOTE — ED Triage Notes (Signed)
Pt reports rectal bleeding that started this morning. She has been experiencing diarrhea (6-7 times) today as well. Denies nausea, vomiting, or fever. She reports diffused lower abdominal cramping. She was seen by here PCP earlier today and completed a stool sample for c.diff.

## 2018-10-29 NOTE — ED Provider Notes (Signed)
[facility] DEPT Provider Note   CSN: [id number] Arrival date & time: 10/29/18  1903     History   Chief Complaint Chief Complaint  Patient presents with  . Rectal Bleeding    HPI Karen Rice is a 67 y.o. female.  HPI Pt was seen at [facility]. Per pt, c/o gradual onset and persistence of multiple intermittent episodes of diarrhea that began earlier today. Has been associated with rectal bleeding and generalized abd "cramping." Pt was evaluated by her PMD today, had positive heme stool test, and a cdiff test was ordered because she had been "going to the hospital doing a lot of visiting."  No recent travel, no sick contacts.  Denies rectal pain, no CP/SOB, no back pain, no fevers, no black stools, no N/V.      Past Medical History:  Diagnosis Date  . Allergy    allergic rhinitis  . Family history of breast cancer   . Family history of uterine cancer   . Gilbert's syndrome    with high bilirubin  . Hypertension   . Osteoporosis     Patient Active Problem List   Diagnosis Date Noted  . Diarrhea 10/29/2018  . Rectal bleeding 10/29/2018  . Genetic testing 08/26/2018  . Family history of breast cancer   . Family history of uterine cancer   . Welcome to Medicare preventive visit 10/06/2016  . Estrogen deficiency 09/18/2015  . Hypertension 03/03/2011  . Routine general medical examination at a health care facility 03/03/2011  . ALLERGIC RHINITIS 07/21/2007  . Osteoporosis 07/21/2007    Past Surgical History:  Procedure Laterality Date  . TONSILLECTOMY AND ADENOIDECTOMY  1957     OB History    Gravida  3   Para  3   Term  3   Preterm      AB      Living  3     SAB      TAB      Ectopic      Multiple      Live Births               Home Medications    Prior to Admission medications   Medication Sig Start Date End Date Taking? Authorizing Provider  Cholecalciferol (VITAMIN D) 2000 UNITS tablet Take 2,000 Units by  mouth daily.   Yes [provider]  Multiple Vitamin (MULTIVITAMIN) capsule Take 1 capsule by mouth daily.    Yes [provider]  olmesartan (BENICAR) 20 MG tablet TAKE 1 TABLET BY MOUTH EVERY DAY 09/27/18  Yes Tower, Wynelle Fanny, MD    Family History Family History  Problem Relation Age of Onset  . Heart disease Mother        heart attack  . Hypertension Mother   . Breast cancer Mother        ages 67 and 7  . Hypertension Father   . Heart disease Maternal Grandfather        MI  . Hypertension Sister   . Osteopenia Sister   . Lung cancer Paternal Uncle        heavy smoker  . Heart disease Maternal Aunt   . Dementia Paternal Grandmother   . Uterine cancer Cousin        dx in her 39s  . Uterine cancer Other        MGM's maternal aunt    Social History Social History   Tobacco Use  . Smoking status: Former  Smoker    Last attempt to quit: 11/10/2000    Years since quitting: 17.9  . Smokeless tobacco: Never Used  Substance Use Topics  . Alcohol use: Yes    Alcohol/week: 0.0 standard drinks    Comment: 2/4 x weekly  . Drug use: No     Allergies   Boniva [ibandronic acid]; Lisinopril; Norvasc [amlodipine besylate]; and Tetanus toxoid   Review of Systems Review of Systems ROS: Statement: All systems negative except as marked or noted in the HPI; Constitutional: Negative for fever and chills. ; ; Eyes: Negative for eye pain, redness and discharge. ; ; ENMT: Negative for ear pain, hoarseness, nasal congestion, sinus pressure and sore throat. ; ; Cardiovascular: Negative for chest pain, palpitations, diaphoresis, dyspnea and peripheral edema. ; ; Respiratory: Negative for cough, wheezing and stridor. ; ; Gastrointestinal: Negative for nausea, vomiting, hematemesis, jaundice and +diarrhea, abd pain, rectal bleeding. . ; ; Genitourinary: Negative for dysuria, flank pain and hematuria. ; ; Musculoskeletal: Negative for back pain and neck pain. Negative for swelling  and trauma.; ; Skin: Negative for pruritus, rash, abrasions, blisters, bruising and skin lesion.; ; Neuro: Negative for headache, lightheadedness and neck stiffness. Negative for weakness, altered level of consciousness, altered mental status, extremity weakness, paresthesias, involuntary movement, seizure and syncope.       Physical Exam Updated Vital Signs BP (!) 154/97   Pulse 89   Temp 98.1 F (36.7 C) (Oral)   Resp 18   LMP 11/10/2002   SpO2 100%    BP (!) 154/90 (BP Location: Right Arm)   Pulse 77   Temp 98.4 F (36.9 C) (Oral)   Resp 16   LMP 11/10/2002   SpO2 95%    19:58 Orthostatic Vital Signs CS  Orthostatic Lying   BP- Lying: 141/88  Pulse- Lying: 78      Orthostatic Sitting  BP- Sitting: 154/97Abnormal   Pulse- Sitting: 80      Orthostatic Standing at 0 minutes  BP- Standing at 0 minutes: 156/99Abnormal   Pulse- Standing at 0 minutes: 84     Physical Exam 1945: Physical examination:  Nursing notes reviewed; Vital signs and O2 SAT reviewed;  Constitutional: Well developed, Well nourished, Well hydrated, In no acute distress; Head:  Normocephalic, atraumatic; Eyes: EOMI, PERRL, No scleral icterus; ENMT: Mouth and pharynx normal, Mucous membranes moist; Neck: Supple, Full range of motion, No lymphadenopathy; Cardiovascular: Regular rate and rhythm, No gallop; Respiratory: Breath sounds clear & equal bilaterally, No wheezes.  Speaking full sentences with ease, Normal respiratory effort/excursion; Chest: Nontender, Movement normal; Abdomen: Soft, +very mild diffuse tenderness to palp. No rebound or guarding. Nondistended, Normal bowel sounds; Genitourinary: No CVA tenderness; Extremities: Peripheral pulses normal, No tenderness, No edema, No calf edema or asymmetry.; Neuro: AA&Ox3, Major CN grossly intact.  Speech clear. No gross focal motor or sensory deficits in extremities. Climbs on and off stretcher easily by herself. Gait steady..; Skin: Color normal, Warm,  Dry.   ED Treatments / Results  Labs (all labs ordered are listed, but only abnormal results are displayed)   EKG None  Radiology   Procedures Procedures (including critical care time)  Medications Ordered in ED Medications - No data to display   Initial Impression / Assessment and Plan / ED Course  I have reviewed the triage vital signs and the nursing notes.  Pertinent labs & imaging results that were available during my care of the patient were reviewed by me and considered in my medical decision  making (see chart for details).  MDM Reviewed: previous chart, nursing note and vitals Reviewed previous: labs Interpretation: labs and CT scan   Results for orders placed or performed during the hospital encounter of 10/29/18  Comprehensive metabolic panel  Result Value Ref Range   Sodium 132 (L) 135 - 145 mmol/L   Potassium 3.2 (L) 3.5 - 5.1 mmol/L   Chloride 99 98 - 111 mmol/L   CO2 24 22 - 32 mmol/L   Glucose, Bld 106 (H) 70 - 99 mg/dL   BUN 18 8 - 23 mg/dL   Creatinine, Ser 0.59 0.44 - 1.00 mg/dL   Calcium 9.4 8.9 - 10.3 mg/dL   Total Protein 6.9 6.5 - 8.1 g/dL   Albumin 4.8 3.5 - 5.0 g/dL   AST 22 15 - 41 U/L   ALT 16 0 - 44 U/L   Alkaline Phosphatase 43 38 - 126 U/L   Total Bilirubin 2.9 (H) 0.3 - 1.2 mg/dL   GFR calc non Af Amer >60 >60 mL/min   GFR calc Af Amer >60 >60 mL/min   Anion gap 9 5 - 15  CBC  Result Value Ref Range   WBC 13.8 (H) 4.0 - 10.5 K/uL   RBC 4.11 3.87 - 5.11 MIL/uL   Hemoglobin 13.2 12.0 - 15.0 g/dL   HCT 39.3 36.0 - 46.0 %   MCV 95.6 80.0 - 100.0 fL   MCH 32.1 26.0 - 34.0 pg   MCHC 33.6 30.0 - 36.0 g/dL   RDW 12.6 11.5 - 15.5 %   Platelets 277 150 - 400 K/uL   nRBC 0.0 0.0 - 0.2 %  Differential  Result Value Ref Range   Neutrophils Relative % 80 %   Neutro Abs 11.0 (H) 1.7 - 7.7 K/uL   Lymphocytes Relative 13 %   Lymphs Abs 1.7 0.7 - 4.0 K/uL   Monocytes Relative 7 %   Monocytes Absolute 0.9 0.1 - 1.0 K/uL   Eosinophils  Relative 0 %   Eosinophils Absolute 0.0 0.0 - 0.5 K/uL   Basophils Relative 0 %   Basophils Absolute 0.1 0.0 - 0.1 K/uL   Immature Granulocytes 0 %   Abs Immature Granulocytes 0.05 0.00 - 0.07 K/uL  Urinalysis, Routine w reflex microscopic  Result Value Ref Range   Color, Urine STRAW (A) YELLOW   APPearance CLEAR CLEAR   Specific Gravity, Urine 1.034 (H) 1.005 - 1.030   pH 6.0 5.0 - 8.0   Glucose, UA NEGATIVE NEGATIVE mg/dL   Hgb urine dipstick SMALL (A) NEGATIVE   Bilirubin Urine NEGATIVE NEGATIVE   Ketones, ur 5 (A) NEGATIVE mg/dL   Protein, ur NEGATIVE NEGATIVE mg/dL   Nitrite NEGATIVE NEGATIVE   Leukocytes, UA TRACE (A) NEGATIVE   RBC / HPF 0-5 0 - 5 RBC/hpf   WBC, UA 0-5 0 - 5 WBC/hpf   Bacteria, UA NONE SEEN NONE SEEN  Type and screen [place]  Result Value Ref Range   ABO/RH(D) O POS    Antibody Screen NEG    Sample Expiration      11/01/2018 Performed at [facility], [place] [address], [place], [place] [address]   ABO/Rh  Result Value Ref Range   ABO/RH(D)      O POS Performed at [facility] [address], [place], [place] [address]     Ct Abdomen Pelvis W Contrast Result Date: 10/29/2018 CLINICAL DATA:  Acute onset of generalized abdominal pain and diarrhea. EXAM:  CT ABDOMEN AND PELVIS WITH CONTRAST TECHNIQUE: Multidetector CT imaging of the abdomen and pelvis was performed using the standard protocol following bolus administration of intravenous contrast. CONTRAST:  100 mL ISOVUE-300 IOPAMIDOL (ISOVUE-300) INJECTION 61% COMPARISON:  None. FINDINGS: Lower chest: The visualized lung bases are grossly clear. The visualized portions of the mediastinum are unremarkable. Hepatobiliary: The liver is unremarkable in appearance. The gallbladder is unremarkable in appearance. The common bile duct remains normal in caliber. Pancreas: The pancreas is within normal limits. Spleen: The spleen is unremarkable in  appearance. Adrenals/Urinary Tract: The adrenal glands are unremarkable in appearance. The kidneys are within normal limits. There is no evidence of hydronephrosis. No renal or ureteral stones are identified. No perinephric stranding is seen. Stomach/Bowel: The stomach is unremarkable in appearance. The small bowel is within normal limits. The appendix is normal in caliber, without evidence of appendicitis. There is diffuse wall thickening along the descending and proximal sigmoid colon, concerning for an acute infectious or inflammatory colitis. Surrounding soft tissue inflammation is noted. Associated diverticula do not appear particularly inflamed. Scattered diverticulosis extends along the descending and sigmoid colon. Vascular/Lymphatic: The abdominal aorta is unremarkable in appearance. The inferior vena cava is grossly unremarkable. No retroperitoneal lymphadenopathy is seen. No pelvic sidewall lymphadenopathy is identified. Reproductive: The bladder is mildly distended and grossly unremarkable. The uterus is unremarkable in appearance. The ovaries are relatively symmetric. No suspicious adnexal masses are seen. Other: No additional soft tissue abnormalities are seen. Musculoskeletal: No acute osseous abnormalities are identified. The visualized musculature is unremarkable in appearance. IMPRESSION: 1. Diffuse wall thickening along the descending and proximal sigmoid colon, concerning for an acute infectious or inflammatory colitis. Surrounding soft tissue inflammation noted. 2. Scattered diverticulosis extends along the descending and sigmoid colon. Electronically Signed   By: Garald Balding M.D.   On: 10/29/2018 22:22    6226:  Pt defers rectal exam, as she just had one at her PMD's office today. Pt has tol PO well while in the ED without N/V.  No stooling while in the ED.  Abd essentially benign, resps easy, VSS. Remains afebrile. H/H stable.  Has ambulated several times. with upright steady gait, NAD.  Potassium repleted PO. Discussed admission, pt prefers to go home with abx. 1st doses given in ED. Pt unable to give stool sample in ED, but states she was able to give one to her PMD's office for testing. Return precautions given. Dx and testing d/w pt and family.  Questions answered.  Verb understanding, agreeable to d/c home with outpt f/u.    Final Clinical Impressions(s) / ED Diagnoses   Final diagnoses:  None    ED Discharge Orders    None       Francine Graven, DO 11/01/18 [id number]

## 2018-10-29 NOTE — Progress Notes (Signed)
Subjective:    Patient ID: Karen Rice, female    DOB: 1951/03/13, 67 y.o.   MRN: [id number]  HPI  Here for c/o of diarrhea with ? Of blood in stool  Wt Readings from Last 3 Encounters:  10/29/18 112 lb (50.8 kg)  12/28/17 114 lb (51.7 kg)  10/09/17 114 lb 8 oz (51.9 kg)   19.53 kg/m   Last colonoscopy 11/09   Has been in and out of the hospital with her partner (who has breast cancer)  ? What she could have been exp to   Woke up at 5 am  Had nausea /no vomiting Then diarrhea 4-5 times  At the end -saw just a little blood on toilet paper- bright red   Diarrhea has slowed down  Still has some mild cramping   No abx lately  No severe pain   No fever  No longer nauseated  Has not eaten anything   Drank some water however   Had a big dinner last night - beef tips marinated in red sauce  Pasta / spinach    Patient Active Problem List   Diagnosis Date Noted  . Genetic testing 08/26/2018  . Family history of breast cancer   . Family history of uterine cancer   . Welcome to Medicare preventive visit 10/06/2016  . Estrogen deficiency 09/18/2015  . Hypertension 03/03/2011  . Routine general medical examination at a health care facility 03/03/2011  . ALLERGIC RHINITIS 07/21/2007  . Osteoporosis 07/21/2007   Past Medical History:  Diagnosis Date  . Allergy    allergic rhinitis  . Family history of breast cancer   . Family history of uterine cancer   . Gilbert's syndrome    with high bilirubin  . Hypertension   . Osteoporosis    Past Surgical History:  Procedure Laterality Date  . TONSILLECTOMY AND ADENOIDECTOMY  1957   Social History   Tobacco Use  . Smoking status: Former Smoker    Last attempt to quit: 11/10/2000    Years since quitting: 17.9  . Smokeless tobacco: Never Used  Substance Use Topics  . Alcohol use: Yes    Alcohol/week: 0.0 standard drinks    Comment: 2/4 x weekly  . Drug use: No   Family History  Problem Relation Age of Onset  .  Heart disease Mother        heart attack  . Hypertension Mother   . Breast cancer Mother        ages 21 and 58  . Hypertension Father   . Heart disease Maternal Grandfather        MI  . Hypertension Sister   . Osteopenia Sister   . Lung cancer Paternal Uncle        heavy smoker  . Heart disease Maternal Aunt   . Dementia Paternal Grandmother   . Uterine cancer Cousin        dx in her 24s  . Uterine cancer Other        MGM's maternal aunt   Allergies  Allergen Reactions  . Boniva [Ibandronic Acid] Nausea Only  . Lisinopril Other (See Comments)    Severe fatigue  . Norvasc [Amlodipine Besylate]     Fatigue/ depression  . Tetanus Toxoid     REACTION: very fatigued for 1 week after   Current Outpatient Medications on File Prior to Visit  Medication Sig Dispense Refill  . Cholecalciferol (VITAMIN D) 2000 UNITS tablet Take 2,000 Units by mouth daily.    Marland Kitchen  fexofenadine (ALLEGRA ALLERGY CHILDRENS) 30 MG tablet Take 15 mg by mouth daily as needed.    . Multiple Vitamin (MULTIVITAMIN) capsule Take 1 capsule by mouth daily.     Marland Kitchen olmesartan (BENICAR) 20 MG tablet TAKE 1 TABLET BY MOUTH EVERY DAY 30 tablet 1  . vitamin C (ASCORBIC ACID) 500 MG tablet Take 500 mg by mouth daily. As needed     No current facility-administered medications on file prior to visit.     Review of Systems  Constitutional: Positive for appetite change and fatigue. Negative for activity change, fever and unexpected weight change.  HENT: Negative for congestion, ear pain, rhinorrhea, sinus pressure and sore throat.   Eyes: Negative for pain, redness and visual disturbance.  Respiratory: Negative for cough, shortness of breath and wheezing.   Cardiovascular: Negative for chest pain and palpitations.  Gastrointestinal: Positive for abdominal pain, anal bleeding and nausea. Negative for abdominal distention, blood in stool, constipation, diarrhea, rectal pain and vomiting.       Diarrhea has slowed down Nausea  is better  Endocrine: Negative for polydipsia and polyuria.  Genitourinary: Negative for dysuria, frequency and urgency.  Musculoskeletal: Negative for arthralgias, back pain and myalgias.  Skin: Negative for pallor and rash.  Allergic/Immunologic: Negative for environmental allergies.  Neurological: Negative for dizziness, syncope and headaches.  Hematological: Negative for adenopathy. Does not bruise/bleed easily.  Psychiatric/Behavioral: Negative for decreased concentration and dysphoric mood. The patient is not nervous/anxious.        Stressor- partner with breast cancer       Objective:   Physical Exam Constitutional:      General: She is not in acute distress.    Appearance: She is well-developed.     Comments: Well appearing   HENT:     Head: Normocephalic and atraumatic.     Mouth/Throat:     Mouth: Mucous membranes are moist.  Eyes:     General: No scleral icterus.    Conjunctiva/sclera: Conjunctivae normal.     Pupils: Pupils are equal, round, and reactive to light.  Neck:     Musculoskeletal: Normal range of motion and neck supple.  Cardiovascular:     Rate and Rhythm: Normal rate and regular rhythm.     Heart sounds: Normal heart sounds.  Pulmonary:     Effort: Pulmonary effort is normal. No respiratory distress.     Breath sounds: Normal breath sounds. No wheezing or rales.  Abdominal:     General: Bowel sounds are normal. There is no distension.     Palpations: Abdomen is soft. There is no mass.     Tenderness: There is abdominal tenderness in the right lower quadrant and left lower quadrant. There is no right CVA tenderness, left CVA tenderness, guarding or rebound. Negative signs include Murphy's sign and McBurney's sign.     Comments: Mild bilateral LQ tenderness w/o rebound or guarding  Genitourinary:    Rectum: Normal.     Comments: Nl rectal exam  No mass/ext hemorrhoid or fissure noted  Stool is heme pos (no visible blood) Lymphadenopathy:      Cervical: No cervical adenopathy.  Skin:    General: Skin is warm and dry.     Capillary Refill: Capillary refill takes less than 2 seconds.     Coloration: Skin is not pale.     Findings: No erythema.  Neurological:     Mental Status: She is alert.  Psychiatric:        Mood and Affect: Mood  normal.           Assessment & Plan:   Problem List Items Addressed This Visit      Cardiovascular and Mediastinum   Hypertension    bp better on 2nd check BP: 132/85          Digestive   Rectal bleeding    From recent diarrhea Reassuring exam  c diff test ordered (has been visiting in hospital) No rectal pain /nl exam  Update if this returns   Also due for screening colonoscopy when feeling better       Relevant Orders   C. difficile GDH and Toxin A/B     Other   Diarrhea - Primary    Somewhat brief this am - now much improved Pt has seen mucous with some blood however (no rectal pain) Stool heme pos today  Reassuring exam In light of recent hospital exposure- c diff test ordered  Enc fluids Then BRAT diet -adv as tolerated  inst to alert [place] if worse or any new symptoms  Rev s/s of dehydration as well        Relevant Orders   C. difficile GDH and Toxin A/B

## 2018-10-30 LAB — ABO/RH: ABO/RH(D): O POS

## 2018-10-30 LAB — C. DIFFICILE GDH AND TOXIN A/B
GDH ANTIGEN: NOT DETECTED
MICRO NUMBER:: 91526564
SPECIMEN QUALITY:: ADEQUATE
TOXIN A AND B: NOT DETECTED

## 2018-11-01 ENCOUNTER — Telehealth: Payer: Self-pay

## 2018-11-01 NOTE — Telephone Encounter (Signed)
Aware I will see her then 

## 2018-11-01 NOTE — Telephone Encounter (Signed)
[facility] faxed note pt having a lot of blood in stool. Pt seen [facility] ED on 10/29/18. Pt said no diarrhea or blood since 10/29/18. Pt has ED FU 11/02/18 at 10 AM. FYI to Dr Glori Bickers.

## 2018-11-02 ENCOUNTER — Ambulatory Visit (INDEPENDENT_AMBULATORY_CARE_PROVIDER_SITE_OTHER): Payer: Medicare Other | Admitting: Family Medicine

## 2018-11-02 ENCOUNTER — Encounter: Payer: Self-pay | Admitting: Family Medicine

## 2018-11-02 VITALS — BP 118/70 | HR 74 | Temp 97.7°F | Ht 63.5 in | Wt 111.2 lb

## 2018-11-02 DIAGNOSIS — K529 Noninfective gastroenteritis and colitis, unspecified: Secondary | ICD-10-CM | POA: Diagnosis not present

## 2018-11-02 DIAGNOSIS — R197 Diarrhea, unspecified: Secondary | ICD-10-CM | POA: Diagnosis not present

## 2018-11-02 MED ORDER — ONDANSETRON HCL 8 MG PO TABS: 8.0000 mg | ORAL_TABLET | Freq: Three times a day (TID) | ORAL | 1 refills | Status: DC | PRN

## 2018-11-02 NOTE — Patient Instructions (Signed)
Keep drinking lots of fluids Advance diet as tolerated   We will send a stool culture when you can give a sample   I sent zofran for nausea Take your antibiotics with food and finish them  Probiotics are fine   Alert [place] if no further improvement or if worse at any time   Update if not starting to improve in a week or if worsening

## 2018-11-02 NOTE — Progress Notes (Signed)
Subjective:    Patient ID: Karen Rice, female    DOB: 28-Sep-1951, 67 y.o.   MRN: [id number]  HPI  Here for f/u from ED visit  Presented with multiple episodes of diarrhea and cramping  Some blood Had prev had heme pos stool here (12/20 also)  Results for orders placed or performed during the hospital encounter of 10/29/18  Comprehensive metabolic panel  Result Value Ref Range   Sodium 132 (L) 135 - 145 mmol/L   Potassium 3.2 (L) 3.5 - 5.1 mmol/L   Chloride 99 98 - 111 mmol/L   CO2 24 22 - 32 mmol/L   Glucose, Bld 106 (H) 70 - 99 mg/dL   BUN 18 8 - 23 mg/dL   Creatinine, Ser 0.59 0.44 - 1.00 mg/dL   Calcium 9.4 8.9 - 10.3 mg/dL   Total Protein 6.9 6.5 - 8.1 g/dL   Albumin 4.8 3.5 - 5.0 g/dL   AST 22 15 - 41 U/L   ALT 16 0 - 44 U/L   Alkaline Phosphatase 43 38 - 126 U/L   Total Bilirubin 2.9 (H) 0.3 - 1.2 mg/dL   GFR calc non Af Amer >60 >60 mL/min   GFR calc Af Amer >60 >60 mL/min   Anion gap 9 5 - 15  CBC  Result Value Ref Range   WBC 13.8 (H) 4.0 - 10.5 K/uL   RBC 4.11 3.87 - 5.11 MIL/uL   Hemoglobin 13.2 12.0 - 15.0 g/dL   HCT 39.3 36.0 - 46.0 %   MCV 95.6 80.0 - 100.0 fL   MCH 32.1 26.0 - 34.0 pg   MCHC 33.6 30.0 - 36.0 g/dL   RDW 12.6 11.5 - 15.5 %   Platelets 277 150 - 400 K/uL   nRBC 0.0 0.0 - 0.2 %  Differential  Result Value Ref Range   Neutrophils Relative % 80 %   Neutro Abs 11.0 (H) 1.7 - 7.7 K/uL   Lymphocytes Relative 13 %   Lymphs Abs 1.7 0.7 - 4.0 K/uL   Monocytes Relative 7 %   Monocytes Absolute 0.9 0.1 - 1.0 K/uL   Eosinophils Relative 0 %   Eosinophils Absolute 0.0 0.0 - 0.5 K/uL   Basophils Relative 0 %   Basophils Absolute 0.1 0.0 - 0.1 K/uL   Immature Granulocytes 0 %   Abs Immature Granulocytes 0.05 0.00 - 0.07 K/uL  Urinalysis, Routine w reflex microscopic  Result Value Ref Range   Color, Urine STRAW (A) YELLOW   APPearance CLEAR CLEAR   Specific Gravity, Urine 1.034 (H) 1.005 - 1.030   pH 6.0 5.0 - 8.0   Glucose, UA NEGATIVE  NEGATIVE mg/dL   Hgb urine dipstick SMALL (A) NEGATIVE   Bilirubin Urine NEGATIVE NEGATIVE   Ketones, ur 5 (A) NEGATIVE mg/dL   Protein, ur NEGATIVE NEGATIVE mg/dL   Nitrite NEGATIVE NEGATIVE   Leukocytes, UA TRACE (A) NEGATIVE   RBC / HPF 0-5 0 - 5 RBC/hpf   WBC, UA 0-5 0 - 5 WBC/hpf   Bacteria, UA NONE SEEN NONE SEEN  Type and screen [facility]  Result Value Ref Range   ABO/RH(D) O POS    Antibody Screen NEG    Sample Expiration      11/01/2018 Performed at [facility], [place] [address] [place], [place] [address]   ABO/Rh  Result Value Ref Range   ABO/RH(D)      O POS Performed at [facility]  [address] [place], [place] [address]     c diff test was also neg   CT scan Ct Abdomen Pelvis W Contrast  Result Date: 10/29/2018 CLINICAL DATA:  Acute onset of generalized abdominal pain and diarrhea. EXAM: CT ABDOMEN AND PELVIS WITH CONTRAST TECHNIQUE: Multidetector CT imaging of the abdomen and pelvis was performed using the standard protocol following bolus administration of intravenous contrast. CONTRAST:  100 mL ISOVUE-300 IOPAMIDOL (ISOVUE-300) INJECTION 61% COMPARISON:  None. FINDINGS: Lower chest: The visualized lung bases are grossly clear. The visualized portions of the mediastinum are unremarkable. Hepatobiliary: The liver is unremarkable in appearance. The gallbladder is unremarkable in appearance. The common bile duct remains normal in caliber. Pancreas: The pancreas is within normal limits. Spleen: The spleen is unremarkable in appearance. Adrenals/Urinary Tract: The adrenal glands are unremarkable in appearance. The kidneys are within normal limits. There is no evidence of hydronephrosis. No renal or ureteral stones are identified. No perinephric stranding is seen. Stomach/Bowel: The stomach is unremarkable in appearance. The small bowel is within normal limits. The appendix is normal in caliber, without evidence  of appendicitis. There is diffuse wall thickening along the descending and proximal sigmoid colon, concerning for an acute infectious or inflammatory colitis. Surrounding soft tissue inflammation is noted. Associated diverticula do not appear particularly inflamed. Scattered diverticulosis extends along the descending and sigmoid colon. Vascular/Lymphatic: The abdominal aorta is unremarkable in appearance. The inferior vena cava is grossly unremarkable. No retroperitoneal lymphadenopathy is seen. No pelvic sidewall lymphadenopathy is identified. Reproductive: The bladder is mildly distended and grossly unremarkable. The uterus is unremarkable in appearance. The ovaries are relatively symmetric. No suspicious adnexal masses are seen. Other: No additional soft tissue abnormalities are seen. Musculoskeletal: No acute osseous abnormalities are identified. The visualized musculature is unremarkable in appearance. IMPRESSION: 1. Diffuse wall thickening along the descending and proximal sigmoid colon, concerning for an acute infectious or inflammatory colitis. Surrounding soft tissue inflammation noted. 2. Scattered diverticulosis extends along the descending and sigmoid colon. Electronically Signed   By: Garald Balding M.D.   On: 10/29/2018 22:22   Treated with cipro and flagyl  Today Wt Readings from Last 3 Encounters:  11/02/18 111 lb 4 oz (50.5 kg)  10/29/18 112 lb (50.8 kg)  12/28/17 114 lb (51.7 kg)   BP Readings from Last 3 Encounters:  11/02/18 118/70  10/29/18 (!) 154/90  10/29/18 132/85   Pulse Readings from Last 3 Encounters:  11/02/18 74  10/29/18 77  10/29/18 84    The abx are hard on her stomach  She feels so wiped out  Some nausea from the abx   Appetite is up and down  Feels better in general when she eats   This am was her first bm  [place] - some traces of blood in it (no longer mucous with blood)  Formed stool   Cramping/abd pain are much improved  No fever   Patient  Active Problem List   Diagnosis Date Noted  . Colitis 11/02/2018  . Diarrhea 10/29/2018  . Rectal bleeding 10/29/2018  . Genetic testing 08/26/2018  . Family history of breast cancer   . Family history of uterine cancer   . Welcome to Medicare preventive visit 10/06/2016  . Estrogen deficiency 09/18/2015  . Hypertension 03/03/2011  . Routine general medical examination at a health care facility 03/03/2011  . ALLERGIC RHINITIS 07/21/2007  . Osteoporosis 07/21/2007   Past Medical History:  Diagnosis Date  . Allergy    allergic rhinitis  . Family  history of breast cancer   . Family history of uterine cancer   . Gilbert's syndrome    with high bilirubin  . Hypertension   . Osteoporosis    Past Surgical History:  Procedure Laterality Date  . TONSILLECTOMY AND ADENOIDECTOMY  1957   Social History   Tobacco Use  . Smoking status: Former Smoker    Last attempt to quit: 11/10/2000    Years since quitting: 17.9  . Smokeless tobacco: Never Used  Substance Use Topics  . Alcohol use: Yes    Alcohol/week: 0.0 standard drinks    Comment: 2/4 x weekly  . Drug use: No   Family History  Problem Relation Age of Onset  . Heart disease Mother        heart attack  . Hypertension Mother   . Breast cancer Mother        ages 86 and 51  . Hypertension Father   . Heart disease Maternal Grandfather        MI  . Hypertension Sister   . Osteopenia Sister   . Lung cancer Paternal Uncle        heavy smoker  . Heart disease Maternal Aunt   . Dementia Paternal Grandmother   . Uterine cancer Cousin        dx in her 20s  . Uterine cancer Other        MGM's maternal aunt   Allergies  Allergen Reactions  . Boniva [Ibandronic Acid] Nausea Only  . Lisinopril Other (See Comments)    Severe fatigue  . Norvasc [Amlodipine Besylate]     Fatigue/ depression  . Tetanus Toxoid     REACTION: very fatigued for 1 week after   Current Outpatient Medications on File Prior to Visit  Medication  Sig Dispense Refill  . Cholecalciferol (VITAMIN D) 2000 UNITS tablet Take 2,000 Units by mouth daily.    . ciprofloxacin (CIPRO) 500 MG tablet Take 1 tablet (500 mg total) by mouth 2 (two) times daily. 14 tablet 0  . metroNIDAZOLE (FLAGYL) 500 MG tablet Take 1 tablet (500 mg total) by mouth 3 (three) times daily. 21 tablet 0  . Multiple Vitamin (MULTIVITAMIN) capsule Take 1 capsule by mouth daily.     Marland Kitchen olmesartan (BENICAR) 20 MG tablet TAKE 1 TABLET BY MOUTH EVERY DAY 30 tablet 1   No current facility-administered medications on file prior to visit.     Review of Systems  Constitutional: Positive for appetite change and fatigue. Negative for activity change, fever and unexpected weight change.  HENT: Negative for congestion, ear pain, rhinorrhea, sinus pressure and sore throat.   Eyes: Negative for pain, redness and visual disturbance.  Respiratory: Negative for cough, shortness of breath and wheezing.   Cardiovascular: Negative for chest pain and palpitations.  Gastrointestinal: Positive for diarrhea. Negative for abdominal distention, abdominal pain, anal bleeding, blood in stool, constipation, nausea, rectal pain and vomiting.  Endocrine: Negative for polydipsia and polyuria.  Genitourinary: Negative for dysuria, frequency and urgency.  Musculoskeletal: Negative for arthralgias, back pain and myalgias.  Skin: Negative for pallor and rash.  Allergic/Immunologic: Negative for environmental allergies.  Neurological: Negative for dizziness, syncope and headaches.  Hematological: Negative for adenopathy. Does not bruise/bleed easily.  Psychiatric/Behavioral: Negative for decreased concentration and dysphoric mood. The patient is not nervous/anxious.        Objective:   Physical Exam Constitutional:      General: She is not in acute distress.    Appearance: Normal appearance. She  is normal weight. She is not ill-appearing, toxic-appearing or diaphoretic.  HENT:     Head: Normocephalic  and atraumatic.     Mouth/Throat:     Mouth: Mucous membranes are moist.     Pharynx: No posterior oropharyngeal erythema.  Eyes:     General: No scleral icterus.    Extraocular Movements: Extraocular movements intact.     Conjunctiva/sclera: Conjunctivae normal.     Pupils: Pupils are equal, round, and reactive to light.  Neck:     Musculoskeletal: Normal range of motion. No neck rigidity.  Cardiovascular:     Rate and Rhythm: Normal rate and regular rhythm.     Pulses: Normal pulses.     Heart sounds: No murmur.  Pulmonary:     Effort: Pulmonary effort is normal. No respiratory distress.     Breath sounds: Normal breath sounds. No stridor. No wheezing, rhonchi or rales.  Abdominal:     General: Abdomen is flat. Bowel sounds are normal. There is no distension.     Palpations: There is no mass.     Tenderness: There is abdominal tenderness in the left lower quadrant. There is no right CVA tenderness, left CVA tenderness, guarding or rebound. Negative signs include Murphy's sign and McBurney's sign.     Hernia: No hernia is present.     Comments: Very mild tenderness LLQ  No rebound or guarding  Musculoskeletal:     Right lower leg: No edema.     Left lower leg: No edema.  Lymphadenopathy:     Cervical: No cervical adenopathy.  Skin:    General: Skin is warm and dry.     Capillary Refill: Capillary refill takes less than 2 seconds.     Coloration: Skin is not jaundiced or pale.     Findings: No rash.  Neurological:     General: No focal deficit present.     Mental Status: She is alert.     Cranial Nerves: No cranial nerve deficit.     Coordination: Coordination normal.     Deep Tendon Reflexes: Reflexes normal.  Psychiatric:        Mood and Affect: Mood normal.           Assessment & Plan:   Problem List Items Addressed This Visit      Digestive   Colitis - Primary    Infectious suspected -cannot r/o other causes  tx with flagyl and cipro  Reviewed hospital  records, lab results and studies in detail   Having some nausea from abx (zofran px)  Stool cx ordered (pending) Enc good hydration and gradual return to regular diet  inst to update if symptoms return or if ss of dehydration  Will need GI ref if no further imp or worse        Relevant Orders   Stool culture     Other   Diarrhea    With blood -diagnosed with colitis (presumed infx) in hospital  Reviewed hospital records, lab results and studies in detail   Finish cipro and flagyl  Fluids/ adv diet slowly  Stool cx pend      Relevant Orders   Stool culture

## 2018-11-03 NOTE — Assessment & Plan Note (Signed)
With blood -diagnosed with colitis (presumed infx) in hospital  Reviewed hospital records, lab results and studies in detail   Finish cipro and flagyl  Fluids/ adv diet slowly  Stool cx pend

## 2018-11-03 NOTE — Assessment & Plan Note (Signed)
Infectious suspected -cannot r/o other causes  tx with flagyl and cipro  Reviewed hospital records, lab results and studies in detail   Having some nausea from abx (zofran px)  Stool cx ordered (pending) Enc good hydration and gradual return to regular diet  inst to update if symptoms return or if ss of dehydration  Will need GI ref if no further imp or worse

## 2018-11-23 ENCOUNTER — Encounter: Payer: Medicare Other | Admitting: Family Medicine

## 2018-11-24 ENCOUNTER — Other Ambulatory Visit: Payer: Self-pay | Admitting: Family Medicine

## 2018-11-24 NOTE — Telephone Encounter (Signed)
CPE scheduled on 12/02/18, last filled on 11/02/18 #20 tabs with 1 refill

## 2018-11-26 ENCOUNTER — Other Ambulatory Visit: Payer: Self-pay | Admitting: Family Medicine

## 2018-12-02 ENCOUNTER — Encounter: Payer: Self-pay | Admitting: Family Medicine

## 2018-12-02 ENCOUNTER — Ambulatory Visit (INDEPENDENT_AMBULATORY_CARE_PROVIDER_SITE_OTHER): Payer: Medicare Other | Admitting: Family Medicine

## 2018-12-02 VITALS — BP 134/64 | HR 72 | Temp 97.5°F | Ht 63.25 in | Wt 108.5 lb

## 2018-12-02 DIAGNOSIS — M810 Age-related osteoporosis without current pathological fracture: Secondary | ICD-10-CM

## 2018-12-02 DIAGNOSIS — Z0000 Encounter for general adult medical examination without abnormal findings: Secondary | ICD-10-CM | POA: Insufficient documentation

## 2018-12-02 DIAGNOSIS — I10 Essential (primary) hypertension: Secondary | ICD-10-CM

## 2018-12-02 DIAGNOSIS — Z1211 Encounter for screening for malignant neoplasm of colon: Secondary | ICD-10-CM

## 2018-12-02 MED ORDER — OLMESARTAN MEDOXOMIL 20 MG PO TABS: 20.0000 mg | ORAL_TABLET | Freq: Every day | ORAL | 11 refills | Status: DC

## 2018-12-02 NOTE — Assessment & Plan Note (Signed)
Managed by gyn  Has taken fosamax in the past  No falls or fx Good exercise Will inc vit D to 3000 iu daily  Her gyn will plan next dexa

## 2018-12-02 NOTE — Assessment & Plan Note (Signed)
bp in fair control at this time  BP Readings from Last 1 Encounters:  12/02/18 134/64   No changes needed Most recent labs reviewed  Disc lifstyle change with low sodium diet and exercise

## 2018-12-02 NOTE — Assessment & Plan Note (Signed)
Due for colonoscopy Ref done 

## 2018-12-02 NOTE — Patient Instructions (Addendum)
We will refer you for a colonoscopy   You are due for a pneumonia vaccine (23)   Add another 1000 iu of vitamin D daily  For a total of 3000 iu daily

## 2018-12-02 NOTE — Progress Notes (Signed)
Subjective:    Patient ID: Karen Rice, female    DOB: 15-Jun-1951, 68 y.o.   MRN: [id number]  HPI  Here for amw and annual f/u of chronic health problems   I have personally reviewed the Medicare Annual Wellness questionnaire and have noted 1. The patient's medical and social history 2. Their use of alcohol, tobacco or illicit drugs 3. Their current medications and supplements 4. The patient's functional ability including ADL's, fall risks, home safety risks and hearing or visual             impairment. 5. Diet and physical activities 6. Evidence for depression or mood disorders  The patients weight, height, BMI have been recorded in the chart and visual acuity is per eye clinic.  I have made referrals, counseling and provided education to the patient based review of the above and I have provided the pt with a written personalized care plan for preventive services. Reviewed and updated provider list, see scanned forms.  Doing fairly well  occ low abdomen is occ still a little tender   See scanned forms.  Routine anticipatory guidance given to patient.  See health maintenance. Colon cancer screening 11/09, also had colitis this year  Breast cancer screening mammogram 10/19  Self breast exam-no lumps or changes /had genetic testing  Mother had breast cancer  Gyn exam -has gyn provider 2/19 -had a pap / she has an appt in June  Flu vaccine 10/19  Tetanus vaccine 11/14 Pneumovax due for PNA 23 Zoster vaccine -zostavax 5/14 dexa 3/17 with bone loss (gyn will order the next one)  Did not tolerate boniva  Had fosamax in the past  Taking calcium and vit D and excising  No falls or fractures  Advance directive-has living will and poa / partner is her poa and eldest son Octavia Bruckner Vandervort  Cognitive function addressed- see scanned forms- and if abnormal then additional documentation follows.  No issues or concerns   PMH and SH reviewed  Meds, vitals, and allergies reviewed.   ROS: See HPI.   Otherwise negative.     Hearing Screening   125Hz  250Hz  500Hz  1000Hz  2000Hz  3000Hz  4000Hz  6000Hz  8000Hz   Right ear:   40 40 40  40    Left ear:   40 40 40  40    Vision Screening Comments: Pt had eye exam with Dr. Kathrin Penner in [place] 2019  Wt Readings from Last 3 Encounters:  12/02/18 108 lb 8 oz (49.2 kg)  11/02/18 111 lb 4 oz (50.5 kg)  10/29/18 112 lb (50.8 kg)  wt is down  Eating enough she thinks   (lost some weight with partner in chemo)  Stressful  19.07 kg/m   bp is stable today  No cp or palpitations or headaches or edema  No side effects to medicines  BP Readings from Last 3 Encounters:  12/02/18 134/64  11/02/18 118/70  10/29/18 (!) 154/90     Pulse Readings from Last 3 Encounters:  12/02/18 72  11/02/18 74  10/29/18 77   Labs in December Lab Results  Component Value Date   CREATININE 0.59 10/29/2018   BUN 18 10/29/2018   NA 132 (L) 10/29/2018   K 3.2 (L) 10/29/2018   CL 99 10/29/2018   CO2 24 10/29/2018   In hospital Panel before-neg  Lab Results  Component Value Date   ALT 16 10/29/2018   AST 22 10/29/2018   ALKPHOS 43 10/29/2018   BILITOT 2.9 (H) 10/29/2018  Has gilber  Cholesterol Lab Results  Component Value Date   CHOL 192 10/19/2018   CHOL 191 10/15/2017   CHOL 189 09/19/2016   Lab Results  Component Value Date   HDL 101.60 10/19/2018   HDL 103.70 10/15/2017   HDL 107.30 09/19/2016   Lab Results  Component Value Date   LDLCALC 80 10/19/2018   LDLCALC 80 10/15/2017   LDLCALC 73 09/19/2016   Lab Results  Component Value Date   TRIG 51.0 10/19/2018   TRIG 39.0 10/15/2017   TRIG 44.0 09/19/2016   Lab Results  Component Value Date   CHOLHDL 2 10/19/2018   CHOLHDL 2 10/15/2017   CHOLHDL 2 09/19/2016   Lab Results  Component Value Date   LDLDIRECT 94.6 07/19/2012   LDLDIRECT 89.7 12/26/2008   Very very good   Vitamin D 36.9  She takes 1000 iu in mvi and 1000 separately   Patient Active Problem List   Diagnosis  Date Noted  . Medicare annual wellness visit, initial 12/02/2018  . Colon cancer screening 12/02/2018  . Colitis 11/02/2018  . Diarrhea 10/29/2018  . Genetic testing 08/26/2018  . Family history of breast cancer   . Family history of uterine cancer   . Welcome to Medicare preventive visit 10/06/2016  . Estrogen deficiency 09/18/2015  . Hypertension 03/03/2011  . Routine general medical examination at a health care facility 03/03/2011  . ALLERGIC RHINITIS 07/21/2007  . Osteoporosis 07/21/2007   Past Medical History:  Diagnosis Date  . Allergy    allergic rhinitis  . Family history of breast cancer   . Family history of uterine cancer   . Gilbert's syndrome    with high bilirubin  . Hypertension   . Osteoporosis    Past Surgical History:  Procedure Laterality Date  . TONSILLECTOMY AND ADENOIDECTOMY  1957   Social History   Tobacco Use  . Smoking status: Former Smoker    Last attempt to quit: 11/10/2000    Years since quitting: 18.0  . Smokeless tobacco: Never Used  Substance Use Topics  . Alcohol use: Yes    Alcohol/week: 0.0 standard drinks    Comment: 2/4 x weekly  . Drug use: No   Family History  Problem Relation Age of Onset  . Heart disease Mother        heart attack  . Hypertension Mother   . Breast cancer Mother        ages 36 and 17  . Hypertension Father   . Heart disease Maternal Grandfather        MI  . Hypertension Sister   . Osteopenia Sister   . Lung cancer Paternal Uncle        heavy smoker  . Heart disease Maternal Aunt   . Dementia Paternal Grandmother   . Uterine cancer Cousin        dx in her 25s  . Uterine cancer Other        MGM's maternal aunt   Allergies  Allergen Reactions  . Boniva [Ibandronic Acid] Nausea Only  . Lisinopril Other (See Comments)    Severe fatigue  . Norvasc [Amlodipine Besylate]     Fatigue/ depression  . Tetanus Toxoid     REACTION: very fatigued for 1 week after   Current Outpatient Medications on File  Prior to Visit  Medication Sig Dispense Refill  . Cholecalciferol (VITAMIN D) 2000 UNITS tablet Take 2,000 Units by mouth daily.    . Multiple Vitamin (MULTIVITAMIN) capsule Take 1  capsule by mouth daily.      No current facility-administered medications on file prior to visit.     Review of Systems  Constitutional: Negative for activity change, appetite change, fatigue, fever and unexpected weight change.  HENT: Negative for congestion, ear pain, rhinorrhea, sinus pressure and sore throat.   Eyes: Negative for pain, redness and visual disturbance.  Respiratory: Negative for cough, shortness of breath and wheezing.   Cardiovascular: Negative for chest pain and palpitations.  Gastrointestinal: Negative for abdominal pain, blood in stool, constipation and diarrhea.  Endocrine: Negative for polydipsia and polyuria.  Genitourinary: Negative for dysuria, frequency and urgency.  Musculoskeletal: Negative for arthralgias, back pain and myalgias.  Skin: Negative for pallor and rash.  Allergic/Immunologic: Negative for environmental allergies.  Neurological: Negative for dizziness, syncope and headaches.  Hematological: Negative for adenopathy. Does not bruise/bleed easily.  Psychiatric/Behavioral: Negative for decreased concentration and dysphoric mood. The patient is not nervous/anxious.        Stress Going through cancer tx with her partner        Objective:   Physical Exam Constitutional:      General: She is not in acute distress.    Appearance: Normal appearance. She is well-developed and normal weight. She is not ill-appearing.  HENT:     Head: Normocephalic and atraumatic.     Right Ear: Tympanic membrane, ear canal and external ear normal.     Left Ear: Tympanic membrane, ear canal and external ear normal.     Nose: Nose normal.     Mouth/Throat:     Mouth: Mucous membranes are moist.     Pharynx: Oropharynx is clear.  Eyes:     General: No scleral icterus.       Right eye:  No discharge.        Left eye: No discharge.     Conjunctiva/sclera: Conjunctivae normal.     Pupils: Pupils are equal, round, and reactive to light.  Neck:     Musculoskeletal: Normal range of motion and neck supple.     Thyroid: No thyromegaly.     Vascular: No carotid bruit or JVD.  Cardiovascular:     Rate and Rhythm: Normal rate and regular rhythm.     Pulses: Normal pulses.     Heart sounds: Normal heart sounds. No gallop.   Pulmonary:     Effort: Pulmonary effort is normal. No respiratory distress.     Breath sounds: Normal breath sounds. No wheezing or rales.  Abdominal:     General: Bowel sounds are normal. There is no distension.     Palpations: Abdomen is soft. There is no mass.     Tenderness: There is no abdominal tenderness.  Musculoskeletal:        General: No tenderness or deformity.     Right lower leg: No edema.     Left lower leg: No edema.     Comments: No kyphosis   Lymphadenopathy:     Cervical: No cervical adenopathy.  Skin:    General: Skin is warm and dry.     Coloration: Skin is not pale.     Findings: No erythema or rash.  Neurological:     General: No focal deficit present.     Mental Status: She is alert.     Cranial Nerves: No cranial nerve deficit.     Motor: No abnormal muscle tone.     Coordination: Coordination normal.     Deep Tendon Reflexes: Reflexes are normal and  symmetric. Reflexes normal.  Psychiatric:        Mood and Affect: Mood normal.           Assessment & Plan:   Problem List Items Addressed This Visit      Cardiovascular and Mediastinum   Hypertension    bp in fair control at this time  BP Readings from Last 1 Encounters:  12/02/18 134/64   No changes needed Most recent labs reviewed  Disc lifstyle change with low sodium diet and exercise        Relevant Medications   olmesartan (BENICAR) 20 MG tablet     Musculoskeletal and Integument   Osteoporosis    Managed by gyn  Has taken fosamax in the past    No falls or fx Good exercise Will inc vit D to 3000 iu daily  Her gyn will plan next dexa        Other   Medicare annual wellness visit, initial - Primary    Reviewed health habits including diet and exercise and skin cancer prevention Reviewed appropriate screening tests for age  Also reviewed health mt list, fam hx and immunization status , as well as social and family history   See HPI  Labs reviewed  Good cognitive status /hearing/vision  Good self care Needs PNA 23 vaccine- she is putting that off a few weeks  Ref for screening colonoscopy Has f/u with gyn to disc bone density        Colon cancer screening    Due for colonoscopy Ref done      Relevant Orders   Ambulatory referral to Gastroenterology

## 2018-12-02 NOTE — Assessment & Plan Note (Signed)
Reviewed health habits including diet and exercise and skin cancer prevention Reviewed appropriate screening tests for age  Also reviewed health mt list, fam hx and immunization status , as well as social and family history   See HPI  Labs reviewed  Good cognitive status /hearing/vision  Good self care Needs PNA 23 vaccine- she is putting that off a few weeks  Ref for screening colonoscopy Has f/u with gyn to disc bone density

## 2018-12-27 ENCOUNTER — Encounter: Payer: Self-pay | Admitting: Gastroenterology

## 2019-01-07 ENCOUNTER — Telehealth: Payer: Self-pay | Admitting: Family Medicine

## 2019-01-07 NOTE — Telephone Encounter (Signed)
Best number [phone]   Pt called to schedule her 2nd half of pneumonia vaccine   23  Is it ok to schedule??

## 2019-01-07 NOTE — Telephone Encounter (Signed)
Please schedule a nurse visit for PNA 23 vaccine Thanks

## 2019-02-01 ENCOUNTER — Encounter: Payer: Self-pay | Admitting: Family Medicine

## 2019-02-03 ENCOUNTER — Encounter: Payer: Medicare Other | Admitting: Gastroenterology

## 2019-03-17 ENCOUNTER — Ambulatory Visit: Payer: Medicare Other | Admitting: Obstetrics & Gynecology

## 2019-03-30 ENCOUNTER — Telehealth: Payer: Self-pay

## 2019-03-30 NOTE — Telephone Encounter (Signed)
Try one of the otc steroid nasal sprays like nasacort or flonase- it Spare take 3-4 days to ramp up but this Saksa help sinus pressure

## 2019-03-30 NOTE — Telephone Encounter (Signed)
Pt left v/m; last seen medicare wellness on 12/02/18. Pt has sinus pressure over rt ear, pt has allergies and sinus pressure. Pt has no fever or cough. Pt does not want to schedule appt but pt wants Dr Marliss Coots advice of OTC med. Pt has been taking Advil Cold and sinus. Pt request cb.

## 2019-03-30 NOTE — Telephone Encounter (Signed)
Pt notified of Dr. Tower's comments and instructions and verbalized understanding  

## 2019-04-15 ENCOUNTER — Ambulatory Visit: Payer: Medicare Other | Admitting: Obstetrics & Gynecology

## 2019-05-02 ENCOUNTER — Ambulatory Visit (INDEPENDENT_AMBULATORY_CARE_PROVIDER_SITE_OTHER): Payer: Medicare Other | Admitting: Gastroenterology

## 2019-05-02 ENCOUNTER — Encounter: Payer: Self-pay | Admitting: Gastroenterology

## 2019-05-02 ENCOUNTER — Encounter: Payer: Self-pay | Admitting: *Deleted

## 2019-05-02 VITALS — Ht 63.5 in | Wt 112.0 lb

## 2019-05-02 DIAGNOSIS — Z1211 Encounter for screening for malignant neoplasm of colon: Secondary | ICD-10-CM

## 2019-05-02 NOTE — Progress Notes (Signed)
Karen Rice    [id number]    13-Apr-1951  Primary Care Physician:Tower, Wynelle Fanny, MD  Referring Physician: Tower, Wynelle Fanny, MD [place],  [place] [address]  This service was provided via audio and video telemedicine (Doximity) due to [place] 19 pandemic.  Patient location: Home Provider location: Office Used 2 patient identifiers to confirm the correct person. Explained the limitations in evaluation and management via telemedicine. Patient is aware of potential medical charges for this visit.  Patient consented to this virtual visit.  The persons participating in this telemedicine service were myself and the patient  Interactive audio and video telecommunications were attempted between this provider and patient, however failed, due to patient having technical difficulties OR patient did not have access to video capability. We continued and completed visit with audio only.  Time spent on call: 15 minutes  Chief complaint:  Colorectal cancer screening  HPI:  68 year old female with hypertension previously followed by Dr. Olevia Perches here to discuss colorectal cancer screening  Colonoscopy September 24, 2006 by Dr. Olevia Perches normal exam, was recommended 10-year recall.  Denies any nausea, vomiting, abdominal pain, melena or bright red blood per rectum She is not on any blood thinners, antiplatelets or anticoagulants.  Outpatient Encounter Medications as of 05/02/2019  Medication Sig  . Ascorbic Acid (VITAMIN C) 1000 MG tablet Take 1,000 mg by mouth daily.  . Cholecalciferol (VITAMIN D) 2000 UNITS tablet Take 2,000 Units by mouth daily.  . Multiple Vitamin (MULTIVITAMIN) capsule Take 1 capsule by mouth daily.   Marland Kitchen olmesartan (BENICAR) 20 MG tablet Take 1 tablet (20 mg total) by mouth daily.   No facility-administered encounter medications on file as of 05/02/2019.     Allergies as of 05/02/2019 - Review Complete 05/02/2019  Allergen Reaction Noted  . Boniva  [ibandronic acid] Nausea Only 02/21/2013  . Lisinopril Other (See Comments) 04/14/2011  . Norvasc [amlodipine besylate]  11/12/2011  . Tetanus toxoid  01/07/2011    Past Medical History:  Diagnosis Date  . Allergy    allergic rhinitis  . Family history of breast cancer   . Family history of uterine cancer   . Gilbert's syndrome    with high bilirubin  . Hypertension   . Osteoporosis     Past Surgical History:  Procedure Laterality Date  . TONSILLECTOMY AND ADENOIDECTOMY  1957    Family History  Problem Relation Age of Onset  . Heart disease Mother        heart attack  . Hypertension Mother   . Breast cancer Mother        ages 78 and 83  . Hypertension Father   . Heart disease Maternal Grandfather        MI  . Hypertension Sister   . Osteopenia Sister   . Lung cancer Paternal Uncle        heavy smoker  . Heart disease Maternal Aunt   . Dementia Paternal Grandmother   . Uterine cancer Cousin        dx in her 6s  . Uterine cancer Other        MGM's maternal aunt    Social History   Socioeconomic History  . Marital status: Divorced    Spouse name: Not on file  . Number of children: Not on file  . Years of education: Not on file  . Highest education level: Not on file  Occupational History  . Not  on file  Social Needs  . Financial resource strain: Not on file  . Food insecurity    Worry: Not on file    Inability: Not on file  . Transportation needs    Medical: Not on file    Non-medical: Not on file  Tobacco Use  . Smoking status: Former Smoker    Quit date: 11/10/2000    Years since quitting: 18.4  . Smokeless tobacco: Never Used  Substance and Sexual Activity  . Alcohol use: Yes    Alcohol/week: 0.0 standard drinks    Comment: 2/4 x weekly  . Drug use: No  . Sexual activity: Yes    Partners: Female    Birth control/protection: Post-menopausal  Lifestyle  . Physical activity    Days per week: Not on file    Minutes per session: Not on file  .  Stress: Not on file  Relationships  . Social Herbalist on phone: Not on file    Gets together: Not on file    Attends religious service: Not on file    Active member of club or organization: Not on file    Attends meetings of clubs or organizations: Not on file    Relationship status: Not on file  . Intimate partner violence    Fear of current or ex partner: Not on file    Emotionally abused: Not on file    Physically abused: Not on file    Forced sexual activity: Not on file  Other Topics Concern  . Not on file  Social History Narrative  . Not on file      Review of systems: Review of Systems as per HPI All other systems reviewed and are negative.   Physical Exam: Vitals were not taken and physical exam was not performed during this virtual visit.  Data Reviewed:  Reviewed labs, radiology imaging, old records and pertinent past GI work up   Assessment and Plan/Recommendations:  68 year old female with hypertension here to discuss colorectal cancer screening We will schedule colonoscopy, next available appointment The risks and benefits as well as alternatives of endoscopic procedure(s) have been discussed and reviewed. All questions answered. The patient agrees to proceed.  Follow-up as needed   K. Denzil Magnuson , MD   CC: Tower, Wynelle Fanny, MD

## 2019-05-02 NOTE — Patient Instructions (Addendum)
Schedule colonoscopy at [facility], we will contact you to schedule (06/23/2019 at 8am)  You have been scheduled for a colonoscopy. Please follow written instructions given to you at your visit today.  Please pick up your prep supplies at the pharmacy within the next 1-3 days. If you use inhalers (even only as needed), please bring them with you on the day of your procedure.   I appreciate the  opportunity to care for you  Thank You   Harl Bowie , MD

## 2019-05-03 MED ORDER — NA SULFATE-K SULFATE-MG SULF 17.5-3.13-1.6 GM/177ML PO SOLN: ORAL | 0 refills | Status: DC

## 2019-05-06 ENCOUNTER — Encounter: Payer: Self-pay | Admitting: Gastroenterology

## 2019-06-15 ENCOUNTER — Other Ambulatory Visit: Payer: Self-pay

## 2019-06-17 ENCOUNTER — Encounter: Payer: Self-pay | Admitting: Obstetrics & Gynecology

## 2019-06-17 ENCOUNTER — Other Ambulatory Visit (HOSPITAL_COMMUNITY)
Admission: RE | Admit: 2019-06-17 | Discharge: 2019-06-17 | Disposition: A | Discharge status: Home / Self Care | Payer: Medicare Other | Source: Ambulatory Visit | Attending: Obstetrics & Gynecology | Admitting: Obstetrics & Gynecology

## 2019-06-17 ENCOUNTER — Other Ambulatory Visit: Payer: Self-pay

## 2019-06-17 ENCOUNTER — Ambulatory Visit (INDEPENDENT_AMBULATORY_CARE_PROVIDER_SITE_OTHER): Payer: Medicare Other | Admitting: Obstetrics & Gynecology

## 2019-06-17 VITALS — BP 168/98 | HR 102 | Temp 97.5°F | Ht 63.25 in | Wt 114.8 lb

## 2019-06-17 DIAGNOSIS — Z01419 Encounter for gynecological examination (general) (routine) without abnormal findings: Secondary | ICD-10-CM | POA: Diagnosis not present

## 2019-06-17 DIAGNOSIS — Z124 Encounter for screening for malignant neoplasm of cervix: Secondary | ICD-10-CM | POA: Insufficient documentation

## 2019-06-17 MED ORDER — TRIAMCINOLONE ACETONIDE 0.025 % EX OINT: 1.0000 "application " | TOPICAL_OINTMENT | Freq: Two times a day (BID) | CUTANEOUS | 0 refills | Status: DC

## 2019-06-17 NOTE — Progress Notes (Signed)
68 y.o. G16P3003 Divorced White or Caucasian female here for annual exam.  Pam's cancer came back.  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  She's just finished chemo and the liver lesions are gone.  PET scan was good two weeks ago.    Denies vaginal bleeding.  Pt is anxious about here today.  BP is elevated.    Diagnosed with inflammatory colitis in December.  Was treated with antibiotics.  Was having some rectal bleeding but this resolved with the antibiotics.  Colonoscopy is scheduled for next week.    Pt did have genetic testing last year.  This was negative.    Patient's last menstrual period was 11/10/2002.          Sexually active: Yes.    The current method of family planning is post menopausal status.    Exercising: Yes.    walking 4 miles daily  Smoker:  no  Health Maintenance: Pap:  06/17/16 Neg. HR HPV:neg   08/29/13 Neg. HR HPV:neg  History of abnormal Pap:  no MMG:  08/17/18 BIRADS1:neg Colonoscopy:  2009 normal.  Has this scheduled with Dr. Silverio Decamp.  BMD:   01/16/16 Osteoporosis  TDaP:  2014 Pneumonia vaccine(s):  2018 Shingrix:  No Hep C testing: 07/01/16 neg  Screening Labs: PCP   reports that she quit smoking about 18 years ago. She has never used smokeless tobacco. She reports current alcohol use of about 5.0 standard drinks of alcohol per week. She reports that she does not use drugs.  Past Medical History:  Diagnosis Date  . Allergy    allergic rhinitis  . Family history of breast cancer   . Family history of uterine cancer   . Gilbert's syndrome    with high bilirubin  . Hypertension   . Osteoporosis     Past Surgical History:  Procedure Laterality Date  . TONSILLECTOMY AND ADENOIDECTOMY  1957    Current Outpatient Medications  Medication Sig Dispense Refill  . Ascorbic Acid (VITAMIN C) 1000 MG tablet Take 1,000 mg by mouth daily.    . Cholecalciferol (VITAMIN D) 2000 UNITS tablet Take 2,000  Units by mouth daily.    . Multiple Vitamin (MULTIVITAMIN) capsule Take 1 capsule by mouth daily.     Marland Kitchen olmesartan (BENICAR) 20 MG tablet Take 1 tablet (20 mg total) by mouth daily. 30 tablet 11   No current facility-administered medications for this visit.     Family History  Problem Relation Age of Onset  . Heart disease Mother        heart attack  . Hypertension Mother   . Breast cancer Mother        ages 32 and 44  . Hypertension Father   . Heart disease Maternal Grandfather        MI  . Hypertension Sister   . Osteopenia Sister   . Lung cancer Paternal Uncle        heavy smoker  . Heart disease Maternal Aunt   . Dementia Paternal Grandmother   . Uterine cancer Cousin        dx in her 28s  . Uterine cancer Other        MGM's maternal aunt    Review of Systems  All other systems reviewed and are negative.   Exam:   BP (!) 168/98   Pulse (!) 102   Temp (!) 97.5 F (36.4 C) (Temporal)   Ht  5' 3.25" (1.607 m)   Wt 114 lb 12.8 oz (52.1 kg)   LMP 11/10/2002   BMI 20.18 kg/m    Height: 5' 3.25" (160.7 cm)  Ht Readings from Last 3 Encounters:  06/17/19 5' 3.25" (1.607 m)  05/02/19 5' 3.5" (1.613 m)  05/02/19 5' 3.5" (1.613 m)    General appearance: alert, cooperative and appears stated age Head: Normocephalic, without obvious abnormality, atraumatic Neck: no adenopathy, supple, symmetrical, trachea midline and thyroid normal to inspection and palpation Lungs: clear to auscultation bilaterally Breasts: normal appearance, no masses or tenderness Heart: regular rate and rhythm Abdomen: soft, non-tender; bowel sounds normal; no masses,  no organomegaly Extremities: extremities normal, atraumatic, no cyanosis or edema Skin: Skin color, texture, turgor normal. No rashes or lesions Lymph nodes: Cervical, supraclavicular, and axillary nodes normal. No abnormal inguinal nodes palpated Neurologic: Grossly normal   Pelvic: External genitalia:  no lesions               Urethra:  normal appearing urethra with no masses, tenderness or lesions              Bartholins and Skenes: normal                 Vagina: normal appearing vagina with normal color and discharge, no lesions              Cervix: no lesions              Pap taken: Yes.   Bimanual Exam:  Uterus:  normal size, contour, position, consistency, mobility, non-tender              Adnexa: normal adnexa and no mass, fullness, tenderness               Rectovaginal: Confirms               Anus:  normal sphincter tone, no lesions; erythema present today (pt reports loose stool yesterday)  Chaperone was present for exam.  A:  Well Woman with normal exam PMP, no HRT Osteoporosis, used Fosamax previously Family hx of breast cancer in mother H/O colitis 12/19  P:   Mammogram guidelines.  Doing 3D MMG. pap smear obtained Colonoscopy is due. She is scheduled. Lab work is done with Dr. Glori Bickers. Order for pneumovax vaccination given Triamcinolone 0.25% ointment BID when has irritation.  Rx to pharmacy return annually or prn

## 2019-06-17 NOTE — Patient Instructions (Signed)
Outpatient Pharmacy at [facility] [address] [place],  [place]  [address]  Main: [phone]

## 2019-06-21 LAB — CYTOLOGY - PAP: Diagnosis: NEGATIVE

## 2019-06-23 ENCOUNTER — Encounter: Payer: Medicare Other | Admitting: Gastroenterology

## 2019-06-24 ENCOUNTER — Telehealth: Payer: Self-pay | Admitting: Obstetrics & Gynecology

## 2019-06-24 NOTE — Telephone Encounter (Signed)
Patient seen 06/17/19 for annual exam. States she had some anal irritation during the visit, which Dr. Sabra Heck prescribed an ointment for. Patient now believes she has a hemorrhoid and would like to be seen next week if possible.

## 2019-06-24 NOTE — Telephone Encounter (Signed)
Call to patient. Patient states that she was seen recently by Dr. Sabra Heck for anal irritation and has been using the triamcinolone cream externally to the area, but now is having some itching and burning inside. Patient states she did not want to use the cream internally. Unsure if this could be a hemorrhoid or yeast? Patient states that she has also noted when she gets out of the bath that there is "seepage" in that area and the seepage is contributing to the burning. Patient requesting OV for Dr. Sabra Heck to evaluate. Patient scheduled for 06-27-2019 at 1530. Patient agreeable to date and time of appointment.   Routing to provider and will close encounter.

## 2019-06-27 ENCOUNTER — Encounter: Payer: Self-pay | Admitting: Obstetrics & Gynecology

## 2019-06-27 ENCOUNTER — Other Ambulatory Visit: Payer: Self-pay

## 2019-06-27 ENCOUNTER — Ambulatory Visit (INDEPENDENT_AMBULATORY_CARE_PROVIDER_SITE_OTHER): Payer: Medicare Other | Admitting: Obstetrics & Gynecology

## 2019-06-27 VITALS — BP 138/86 | HR 80 | Temp 97.7°F | Ht 63.25 in | Wt 115.0 lb

## 2019-06-27 DIAGNOSIS — K6289 Other specified diseases of anus and rectum: Secondary | ICD-10-CM

## 2019-06-27 MED ORDER — TRIAMCINOLONE ACETONIDE 0.025 % EX OINT: 1.0000 "application " | TOPICAL_OINTMENT | Freq: Two times a day (BID) | CUTANEOUS | 0 refills | Status: DC

## 2019-06-27 NOTE — Progress Notes (Signed)
GYNECOLOGY  VISIT  CC:   Anal itching   HPI: 68 y.o. G77P3003 Divorced White or Caucasian female here for anal itching, irritation x 1-2 weeks on and off.  She did have some perianal erythema and irritation when she was here on 06/17/2019.  She was given triamcinolone 0.25% and she reports this really helped the skin.  Now, she's having some internal irritation.    Reports her normal morning routine is to have a bowel movement first and then exercise.  She has been using some OTC wipes and thinks this hasn't helped and [place] have actually irritated her symptoms as well.  She reports she's been sitting in a tub bath as well.  This has helped some.  She does not have constipation.  She does sometimes have rectal bleeding with walking.  This has just been in the last few weeks.  Had a colonoscopy scheduled.  She postponed this for 3 weeks due to rectal irritation.    GYNECOLOGIC HISTORY: Patient's last menstrual period was 11/10/2002. Contraception: PMP Menopausal hormone therapy: none  Patient Active Problem List   Diagnosis Date Noted  . Medicare annual wellness visit, initial 12/02/2018  . Colon cancer screening 12/02/2018  . Colitis 11/02/2018  . Diarrhea 10/29/2018  . Genetic testing 08/26/2018  . Family history of breast cancer   . Family history of uterine cancer   . Welcome to Medicare preventive visit 10/06/2016  . Estrogen deficiency 09/18/2015  . Hypertension 03/03/2011  . Routine general medical examination at a health care facility 03/03/2011  . ALLERGIC RHINITIS 07/21/2007  . Osteoporosis 07/21/2007    Past Medical History:  Diagnosis Date  . Allergy    allergic rhinitis  . Family history of breast cancer   . Family history of uterine cancer   . Gilbert's syndrome    with high bilirubin  . Hypertension   . Osteoporosis     Past Surgical History:  Procedure Laterality Date  . TONSILLECTOMY AND ADENOIDECTOMY  1957    MEDS:   Current Outpatient Medications on  File Prior to Visit  Medication Sig Dispense Refill  . Ascorbic Acid (VITAMIN C) 1000 MG tablet Take 1,000 mg by mouth daily.    . Cholecalciferol (VITAMIN D) 2000 UNITS tablet Take 2,000 Units by mouth daily.    . Multiple Vitamin (MULTIVITAMIN) capsule Take 1 capsule by mouth daily.     Marland Kitchen olmesartan (BENICAR) 20 MG tablet Take 1 tablet (20 mg total) by mouth daily. 30 tablet 11  . triamcinolone (KENALOG) 0.025 % ointment Apply 1 application topically 2 (two) times daily. 30 g 0   No current facility-administered medications on file prior to visit.     ALLERGIES: Boniva [ibandronic acid], Lisinopril, and Norvasc [amlodipine besylate]  Family History  Problem Relation Age of Onset  . Heart disease Mother        heart attack  . Hypertension Mother   . Breast cancer Mother        ages 40 and 27  . Hypertension Father   . Heart disease Maternal Grandfather        MI  . Hypertension Sister   . Osteopenia Sister   . Lung cancer Paternal Uncle        heavy smoker  . Heart disease Maternal Aunt   . Dementia Paternal Grandmother   . Uterine cancer Cousin        dx in her 9s  . Uterine cancer Other        MGM's  maternal aunt    SH:  Married, no smoker  Review of Systems  Gastrointestinal: Positive for rectal pain.       Anal itching   All other systems reviewed and are negative.   PHYSICAL EXAMINATION:    BP 138/86   Pulse 80   Temp 97.7 F (36.5 C) (Temporal)   Ht 5' 3.25" (1.607 m)   Wt 115 lb (52.2 kg)   LMP 11/10/2002   BMI 20.21 kg/m     General appearance: alert, cooperative and appears stated age Lymph:  no inguinal LAD noted  Pelvic: External genitalia:  no lesions              Anus:  normal sphincter tone, no lesions or masses, small amount of erythema in location of tenderness  Physical Exam  Genitourinary:        Chaperone was present for exam.  Assessment: Perirectal irritation  Plan: Pt is to apply the topical steroid ointment exactly as  instructed twice daily for the next week.  She will give me and update in 7 days.  RF for trimacinolone 0.25% ointment BID to pharmacy.

## 2019-07-06 DIAGNOSIS — L570 Actinic keratosis: Secondary | ICD-10-CM | POA: Diagnosis not present

## 2019-07-06 DIAGNOSIS — L821 Other seborrheic keratosis: Secondary | ICD-10-CM | POA: Diagnosis not present

## 2019-07-06 DIAGNOSIS — D2262 Melanocytic nevi of left upper limb, including shoulder: Secondary | ICD-10-CM | POA: Diagnosis not present

## 2019-07-06 DIAGNOSIS — L814 Other melanin hyperpigmentation: Secondary | ICD-10-CM | POA: Diagnosis not present

## 2019-07-06 DIAGNOSIS — D1801 Hemangioma of skin and subcutaneous tissue: Secondary | ICD-10-CM | POA: Diagnosis not present

## 2019-07-13 ENCOUNTER — Telehealth: Payer: Self-pay

## 2019-07-13 NOTE — Telephone Encounter (Signed)
Covid-19 screening questions   Do you now or have you had a fever in the last 14 days? No  Do you have any respiratory symptoms of shortness of breath or cough now or in the last 14 days? No  Do you have any family members or close contacts with diagnosed or suspected Covid-19 in the past 14 days? No  Have you been tested for Covid-19 and found to be positive? No        

## 2019-07-14 ENCOUNTER — Ambulatory Visit (AMBULATORY_SURGERY_CENTER): Payer: Medicare Other | Admitting: Gastroenterology

## 2019-07-14 ENCOUNTER — Other Ambulatory Visit: Payer: Self-pay

## 2019-07-14 ENCOUNTER — Encounter: Payer: Self-pay | Admitting: Gastroenterology

## 2019-07-14 VITALS — BP 121/68 | HR 70 | Temp 98.6°F | Resp 29 | Ht 63.0 in | Wt 115.0 lb

## 2019-07-14 DIAGNOSIS — Z1211 Encounter for screening for malignant neoplasm of colon: Secondary | ICD-10-CM | POA: Diagnosis not present

## 2019-07-14 MED ORDER — SODIUM CHLORIDE 0.9 % IV SOLN: 500.0000 mL | Freq: Once | INTRAVENOUS | Status: DC

## 2019-07-14 NOTE — Patient Instructions (Addendum)
YOU HAD AN ENDOSCOPIC PROCEDURE TODAY AT [facility]:   Refer to the procedure report that was given to you for any specific questions about what was found during the examination.  If the procedure report does not answer your questions, please call your gastroenterologist to clarify.  If you requested that your care partner not be given the details of your procedure findings, then the procedure report has been included in a sealed envelope for you to review at your convenience later.  YOU SHOULD EXPECT: Some feelings of bloating in the abdomen. Passage of more gas than usual.  Walking can help get rid of the air that was put into your GI tract during the procedure and reduce the bloating. If you had a lower endoscopy (such as a colonoscopy or flexible sigmoidoscopy) you [place] notice spotting of blood in your stool or on the toilet paper. If you underwent a bowel prep for your procedure, you Kil not have a normal bowel movement for a few days.  Please Note:  You might notice some irritation and congestion in your nose or some drainage.  This is from the oxygen used during your procedure.  There is no need for concern and it should clear up in a day or so.  SYMPTOMS TO REPORT IMMEDIATELY:   Following lower endoscopy (colonoscopy or flexible sigmoidoscopy):  Excessive amounts of blood in the stool  Significant tenderness or worsening of abdominal pains  Swelling of the abdomen that is new, acute  Fever of 100F or higher  For urgent or emergent issues, a gastroenterologist can be reached at any hour by calling [phone].   DIET:  We do recommend a small meal at first, but then you Swing proceed to your regular diet.  Drink plenty of fluids but you should avoid alcoholic beverages for 24 hours.  MEDICATIONS: Continue present medications.  Follow up with Dr. Silverio Decamp in her office for hemorrhoid banding procedure if they continue to be a problem (information pamphlet given to  patient).  Please see handouts given to you by your recovery nurse.  ACTIVITY:  You should plan to take it easy for the rest of today and you should NOT DRIVE or use heavy machinery until tomorrow (because of the sedation medicines used during the test).    FOLLOW UP: Our staff will call the number listed on your records 48-72 hours following your procedure to check on you and address any questions or concerns that you Marvin have regarding the information given to you following your procedure. If we do not reach you, we will leave a message.  We will attempt to reach you two times.  During this call, we will ask if you have developed any symptoms of COVID 19. If you develop any symptoms (ie: fever, flu-like symptoms, shortness of breath, cough etc.) before then, please call [phone].  If you test positive for Covid 19 in the 2 weeks post procedure, please call and report this information to [place].    If any biopsies were taken you will be contacted by phone or by letter within the next 1-3 weeks.  Please call us at [phone] if you have not heard about the biopsies in 3 weeks.   Thank you for allowing [place] to provide for your healthcare needs today.   SIGNATURES/CONFIDENTIALITY: You and/or your care partner have signed paperwork which will be entered into your electronic medical record.  These signatures attest to the fact that that the information above on  your After Visit Summary has been reviewed and is understood.  Full responsibility of the confidentiality of this discharge information lies with you and/or your care-partner. 

## 2019-07-14 NOTE — Progress Notes (Signed)
Pt Drowsy. VSS. To PACU, report to RN. No anesthetic complications noted.  

## 2019-07-14 NOTE — Op Note (Signed)
[facility] Patient Name: Karen Rice Procedure Date: 07/14/2019 8:04 AM MRN: [id number] Endoscopist: Mauri Pole , MD Age: 68 Referring MD:  Date of Birth: Jul 04, 1951 Gender: Female Account #: [id number] Procedure:                Colonoscopy Indications:              Screening for colorectal malignant neoplasm Medicines:                Monitored Anesthesia Care Procedure:                Pre-Anesthesia Assessment:                           - Prior to the procedure, a History and Physical                            was performed, and patient medications and                            allergies were reviewed. The patient's tolerance of                            previous anesthesia was also reviewed. The risks                            and benefits of the procedure and the sedation                            options and risks were discussed with the patient.                            All questions were answered, and informed consent                            was obtained. Prior Anticoagulants: The patient has                            taken no previous anticoagulant or antiplatelet                            agents. ASA Grade Assessment: II - A patient with                            mild systemic disease. After reviewing the risks                            and benefits, the patient was deemed in                            satisfactory condition to undergo the procedure.                           After obtaining informed consent, the colonoscope  was passed under direct vision. Throughout the                            procedure, the patient's blood pressure, pulse, and                            oxygen saturations were monitored continuously. The                            Colonoscope was introduced through the anus and                            advanced to the the cecum, identified by                            appendiceal orifice and  ileocecal valve. The                            colonoscopy was performed without difficulty. The                            patient tolerated the procedure well. The quality                            of the bowel preparation was excellent. The                            ileocecal valve, appendiceal orifice, and rectum                            were photographed. Scope In: 8:21:18 AM Scope Out: 8:36:41 AM Scope Withdrawal Time: 0 hours 10 minutes 55 seconds  Total Procedure Duration: 0 hours 15 minutes 23 seconds  Findings:                 The perianal and digital rectal examinations were                            normal.                           Scattered small and large-mouthed diverticula were                            found in the sigmoid colon and descending colon.                           Non-bleeding internal hemorrhoids were found during                            retroflexion. The hemorrhoids were small. Complications:            No immediate complications. Estimated Blood Loss:     Estimated blood loss was minimal. Impression:               - Diverticulosis in the sigmoid colon and in  the                            descending colon.                           - Non-bleeding internal hemorrhoids.                           - No specimens collected. Recommendation:           - Patient has a contact number available for                            emergencies. The signs and symptoms of potential                            delayed complications were discussed with the                            patient. Return to normal activities tomorrow.                            Written discharge instructions were provided to the                            patient.                           - Resume previous diet.                           - Continue present medications.                           - Repeat colonoscopy in 10 years for screening                            purposes. Mauri Pole, MD 07/14/2019 8:45:33 AM This report has been signed electronically.

## 2019-07-19 ENCOUNTER — Telehealth: Payer: Self-pay

## 2019-07-19 ENCOUNTER — Other Ambulatory Visit: Payer: Self-pay | Admitting: Obstetrics & Gynecology

## 2019-07-19 DIAGNOSIS — Z1231 Encounter for screening mammogram for malignant neoplasm of breast: Secondary | ICD-10-CM

## 2019-07-19 NOTE — Telephone Encounter (Signed)
  Follow up Call-  Call back number 07/14/2019  Post procedure Call Back phone  # [phone]  Permission to leave phone message Yes  Some recent data might be hidden     Patient questions:  Do you have a fever, pain , or abdominal swelling? No. Pain Score  0 *  Have you tolerated food without any problems? Yes.    Have you been able to return to your normal activities? Yes.    Do you have any questions about your discharge instructions: Diet   No. Medications  No. Follow up visit  No.  Do you have questions or concerns about your Care? No.  Actions: * If pain score is 4 or above: No action needed, pain <4.  1. Have you developed a fever since your procedure? no  2.   Have you had an respiratory symptoms (SOB or cough) since your procedure? no  3.   Have you tested positive for COVID 19 since your procedure no  4.   Have you had any family members/close contacts diagnosed with the COVID 19 since your procedure?  No    If yes to any of these questions please route to Joylene John, RN and Alphonsa Gin, RN.

## 2019-07-26 ENCOUNTER — Ambulatory Visit (INDEPENDENT_AMBULATORY_CARE_PROVIDER_SITE_OTHER): Payer: Medicare Other

## 2019-07-26 DIAGNOSIS — Z23 Encounter for immunization: Secondary | ICD-10-CM | POA: Diagnosis not present

## 2019-08-30 ENCOUNTER — Ambulatory Visit
Admission: RE | Admit: 2019-08-30 | Discharge: 2019-08-30 | Disposition: A | Discharge status: Home / Self Care | Payer: Medicare Other | Source: Ambulatory Visit | Attending: Obstetrics & Gynecology | Admitting: Obstetrics & Gynecology

## 2019-08-30 ENCOUNTER — Other Ambulatory Visit: Payer: Self-pay

## 2019-08-30 ENCOUNTER — Telehealth: Payer: Self-pay | Admitting: Family Medicine

## 2019-08-30 DIAGNOSIS — Z1231 Encounter for screening mammogram for malignant neoplasm of breast: Secondary | ICD-10-CM

## 2019-08-30 NOTE — Telephone Encounter (Signed)
Pt called back and scheduled for 09/15/19 @ 2:30.

## 2019-08-30 NOTE — Telephone Encounter (Signed)
Pt called to schedule 2nd part of  pneumonia vaccine.    Ok to schedule

## 2019-08-30 NOTE — Telephone Encounter (Signed)
Yes-she needs pna 23 Thanks

## 2019-08-30 NOTE — Telephone Encounter (Signed)
prevnar was given on 11/12/16

## 2019-08-30 NOTE — Telephone Encounter (Signed)
I left a detailed message on patient's voice mail to call back and schedule appointment. °

## 2019-09-15 ENCOUNTER — Ambulatory Visit (INDEPENDENT_AMBULATORY_CARE_PROVIDER_SITE_OTHER): Payer: Medicare Other | Admitting: *Deleted

## 2019-09-15 DIAGNOSIS — Z23 Encounter for immunization: Secondary | ICD-10-CM

## 2019-09-15 NOTE — Progress Notes (Signed)
Per orders of Webb Silversmith, NP, injection of Pneumococcal 23 vaccine given by Tammi Sou.  Patient tolerated injection well. PCP out of the office this afternoon.

## 2019-11-20 IMAGING — MG DIGITAL SCREENING BILAT W/ TOMO W/ CAD
8 series · 9 of 24 positions shown · non-contrast
Comparison: Previous exam(s).

CLINICAL DATA: Screening.

EXAM:
DIGITAL SCREENING BILATERAL MAMMOGRAM WITH TOMO AND CAD

[L CC synth-2D]
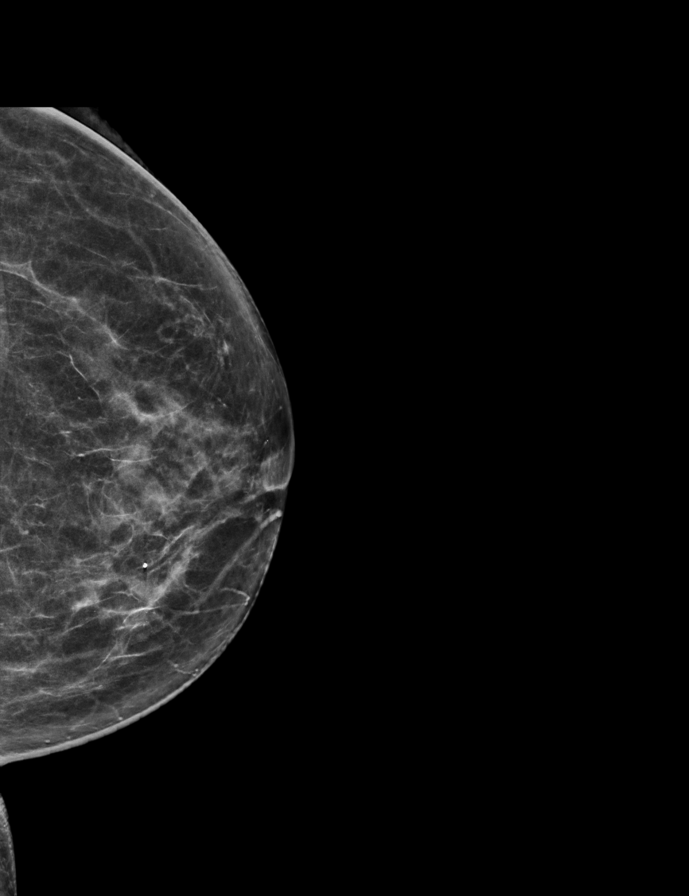

[R MLO synth-2D]
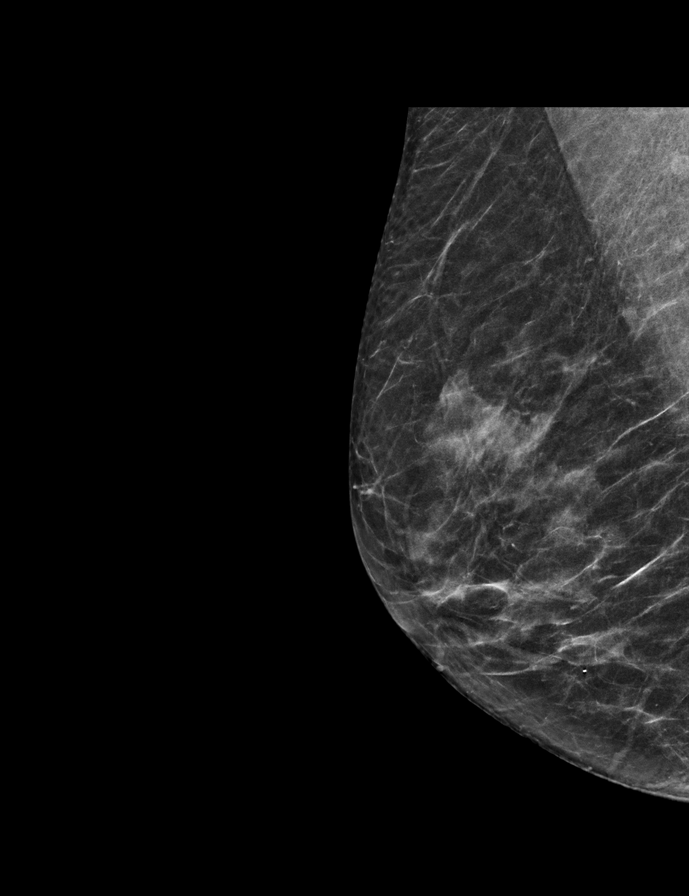

[R CC synth-2D]
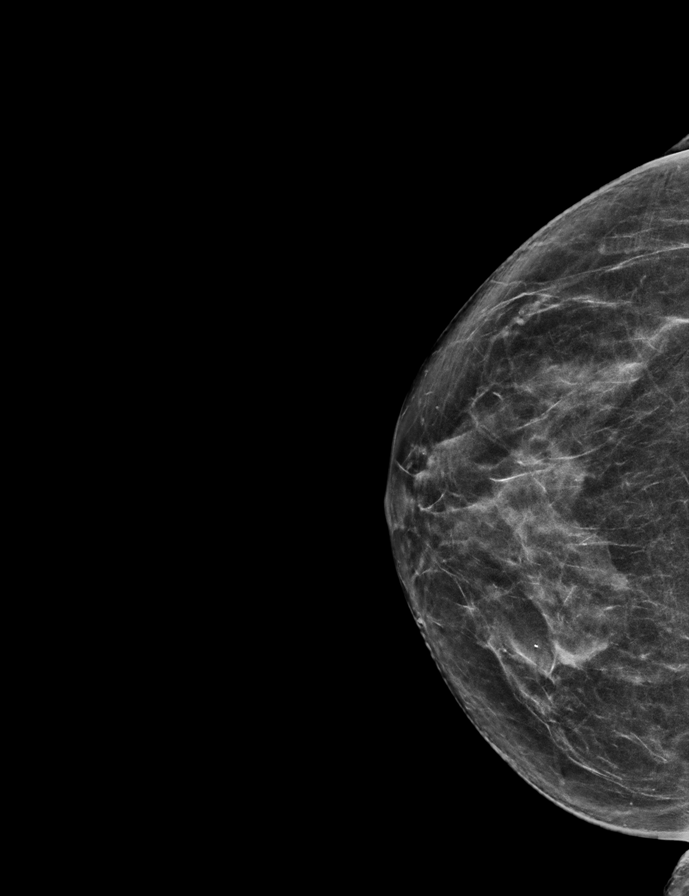

[L MLO synth-2D]
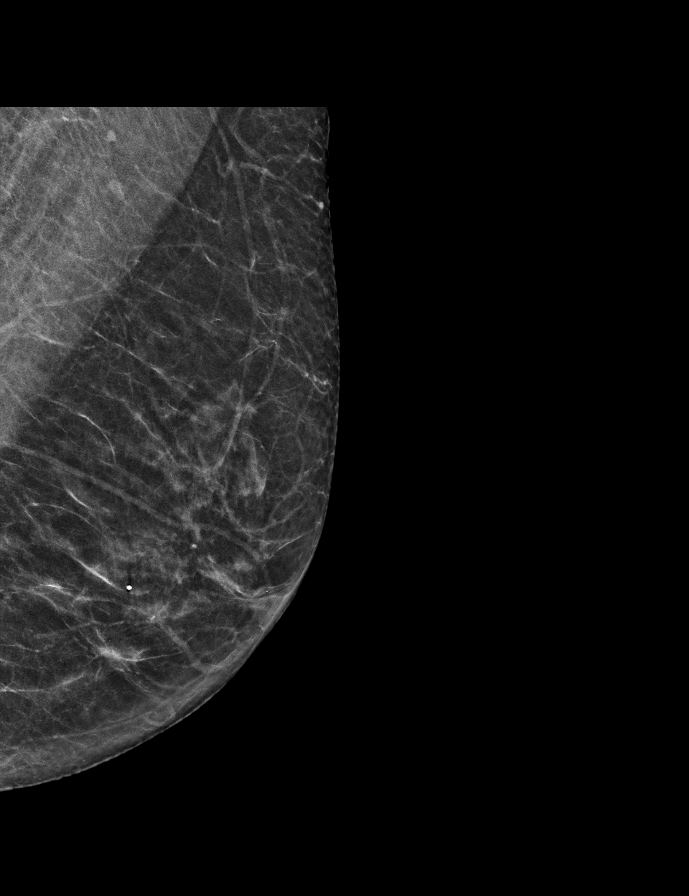

[R CC tomo · 2 of 62 frames shown]
[frame 21/62]
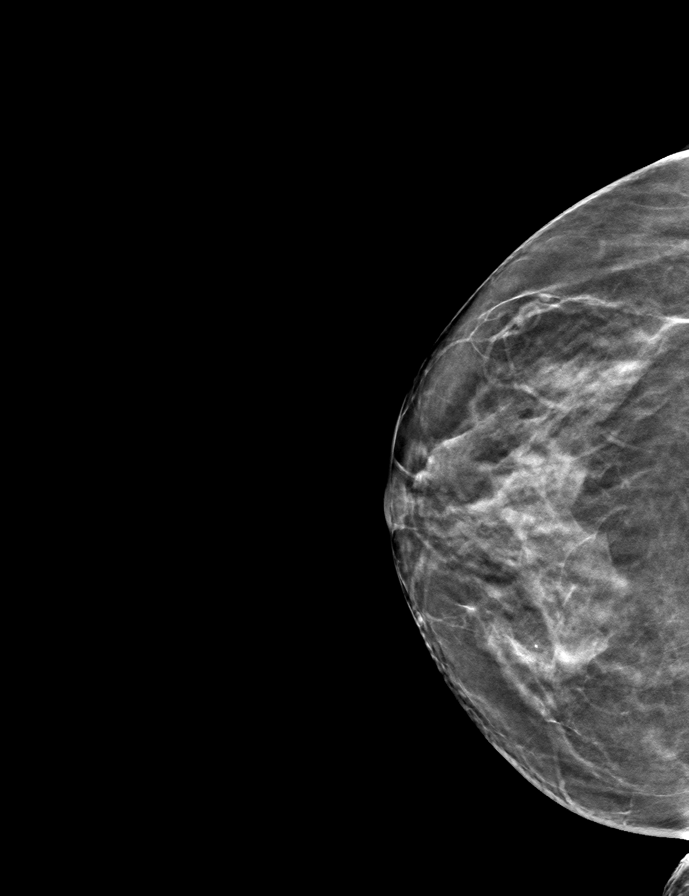
[frame 31/62]
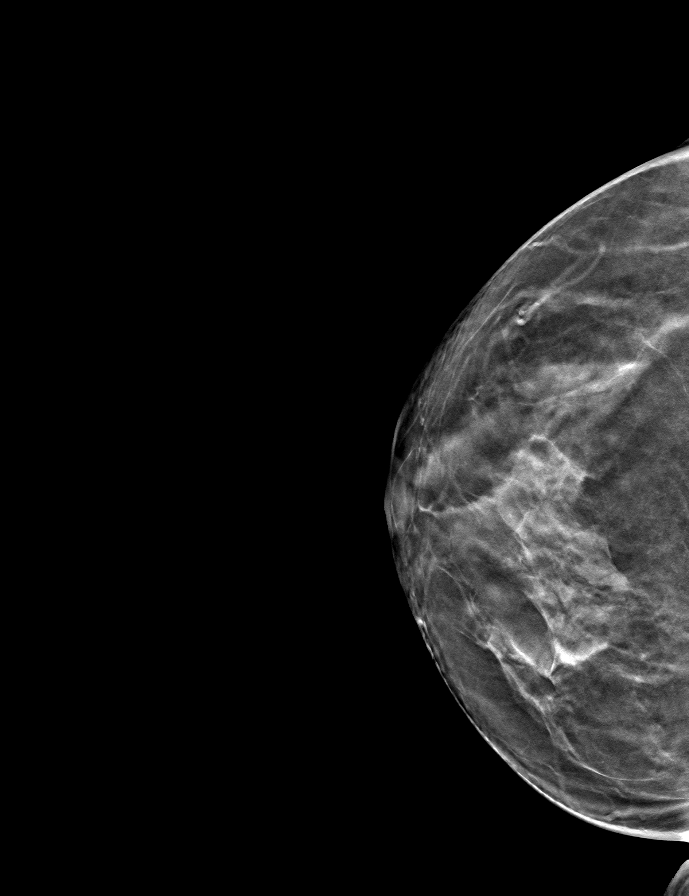

[L MLO tomo · tomo slice 26/51.0]
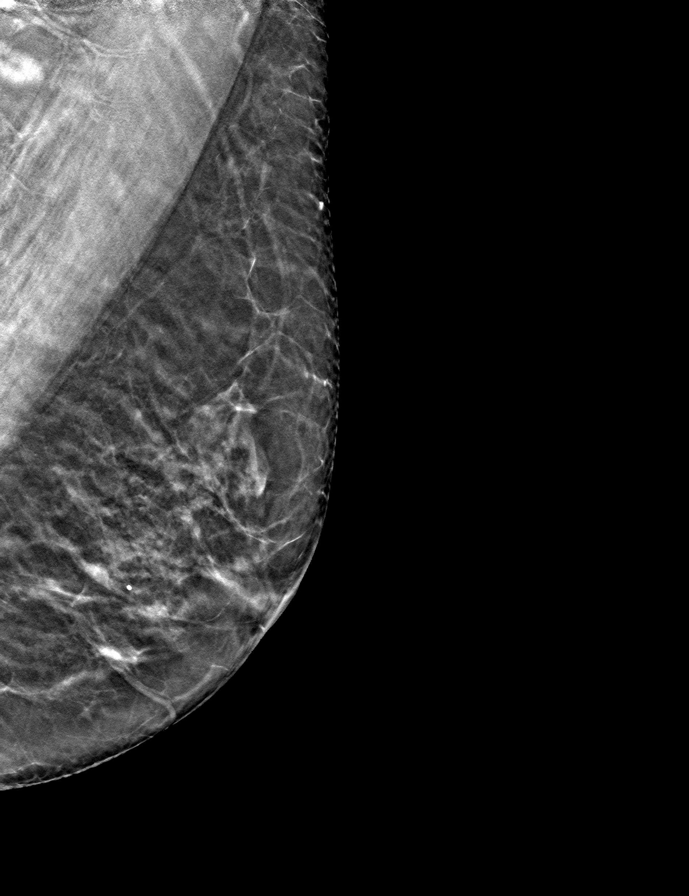

[L CC tomo · tomo slice 31/60.0]
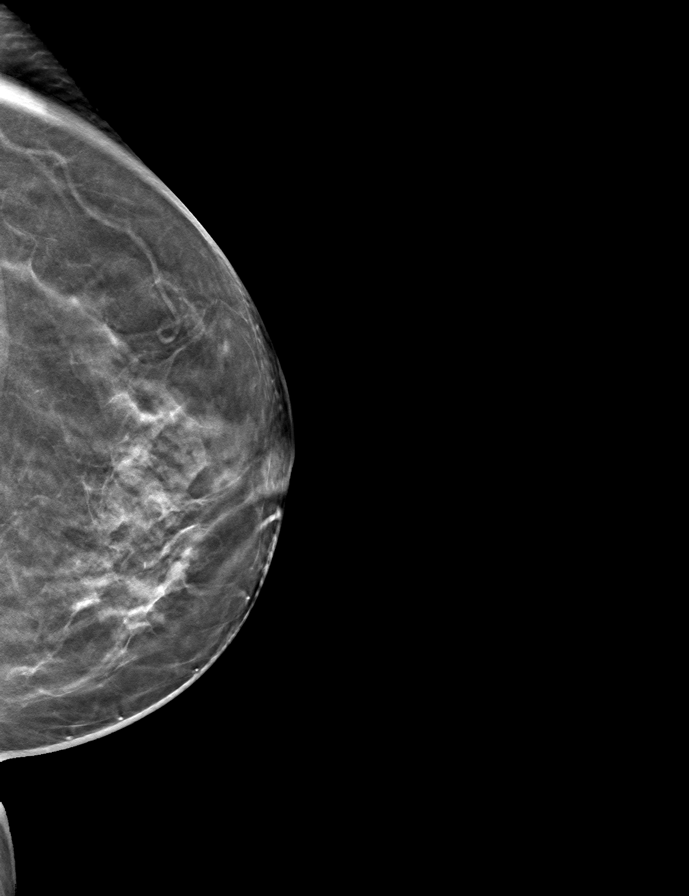

[R MLO tomo · tomo slice 27/53.0]
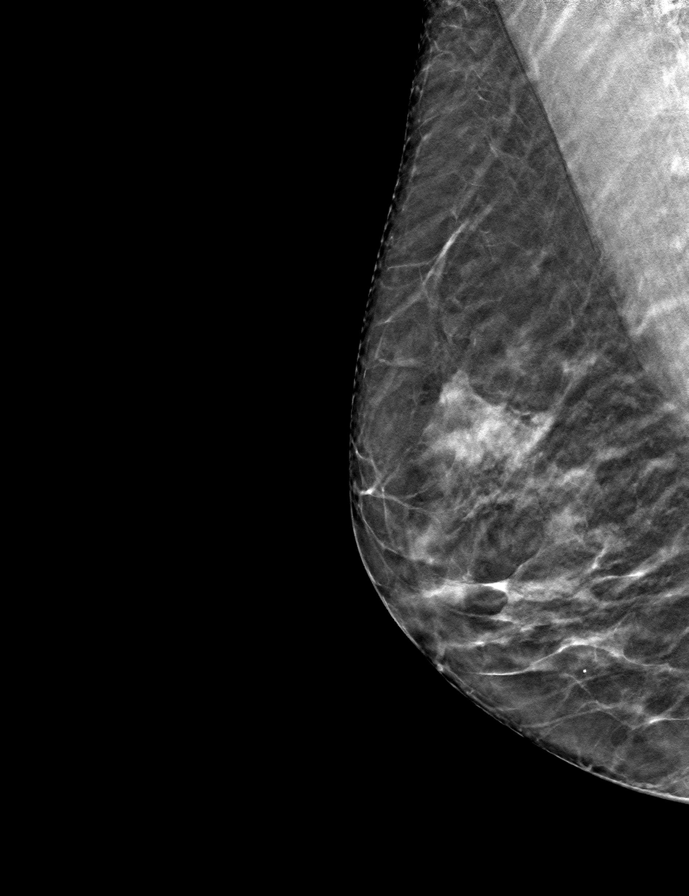

[9 of 24 positions shown; findings below may reference images not displayed]

ACR Breast Density Category c: The breast tissue is heterogeneously
dense, which may obscure small masses.
FINDINGS: There are no findings suspicious for malignancy. Images were
processed with CAD.
IMPRESSION: No mammographic evidence of malignancy. A result letter of this
screening mammogram will be mailed directly to the patient.

RECOMMENDATION:
Screening mammogram in one year. (Code:[id number])

BI-RADS CATEGORY  1: Negative.

## 2019-12-13 ENCOUNTER — Ambulatory Visit: Payer: Medicare Other

## 2019-12-13 ENCOUNTER — Other Ambulatory Visit: Payer: Self-pay | Admitting: Family Medicine

## 2019-12-20 ENCOUNTER — Encounter: Payer: Medicare Other | Admitting: Family Medicine

## 2019-12-29 ENCOUNTER — Ambulatory Visit: Payer: Medicare Other

## 2020-01-02 ENCOUNTER — Ambulatory Visit: Payer: Medicare Other | Attending: Family

## 2020-01-02 DIAGNOSIS — Z23 Encounter for immunization: Secondary | ICD-10-CM | POA: Insufficient documentation

## 2020-01-02 NOTE — Progress Notes (Signed)
   Covid-19 Vaccination [facility]  Name:  Karen Rice    MRN: [id number] DOB: Castronova 11, 1952  01/02/2020  [name] was observed post Covid-19 immunization for 15 minutes without incidence. She was provided with Vaccine Information Sheet and instruction to access the V-Safe system.   Ms. Check was instructed to call 911 with any severe reactions post vaccine: Marland Kitchen Difficulty breathing  . Swelling of your face and throat  . A fast heartbeat  . A bad rash all over your body  . Dizziness and weakness    Immunizations Administered    Name Date Dose VIS Date Route   Moderna COVID-19 Vaccine 01/02/2020  4:15 PM 0.5 mL 10/11/2019 Intramuscular   Manufacturer: Moderna   Lot: [id number]   [id number]

## 2020-01-18 ENCOUNTER — Telehealth: Payer: Self-pay | Admitting: Family Medicine

## 2020-01-18 DIAGNOSIS — M810 Age-related osteoporosis without current pathological fracture: Secondary | ICD-10-CM

## 2020-01-18 DIAGNOSIS — I10 Essential (primary) hypertension: Secondary | ICD-10-CM

## 2020-01-18 NOTE — Telephone Encounter (Signed)
-----   Message from Cloyd Stagers, RT sent at 01/13/2020  2:54 PM EST ----- Regarding: Lab Orders for Friday 3.19.2021 Please place lab orders for Friday 3.19.2021, office visit for physical on Thursday 3.25.2021 Thank you, Dyke Maes RT(R)

## 2020-01-27 ENCOUNTER — Other Ambulatory Visit (INDEPENDENT_AMBULATORY_CARE_PROVIDER_SITE_OTHER): Payer: Medicare Other

## 2020-01-27 ENCOUNTER — Other Ambulatory Visit: Payer: Self-pay

## 2020-01-27 DIAGNOSIS — I10 Essential (primary) hypertension: Secondary | ICD-10-CM | POA: Diagnosis not present

## 2020-01-27 DIAGNOSIS — M810 Age-related osteoporosis without current pathological fracture: Secondary | ICD-10-CM

## 2020-01-27 LAB — LIPID PANEL
Cholesterol: 191 mg/dL (ref 0–200)
HDL: 95 mg/dL (ref >39.00)
LDL Cholesterol: 86 mg/dL (ref 0–99)
NonHDL: 96.1
Total CHOL/HDL Ratio: 2
Triglycerides: 50 mg/dL (ref 0.0–149.0)
VLDL: 10 mg/dL (ref 0.0–40.0)

## 2020-01-27 LAB — CBC WITH DIFFERENTIAL/PLATELET
Basophils Absolute: 0.1 10*3/uL (ref 0.0–0.1)
Basophils Relative: 1.3 % (ref 0.0–3.0)
Eosinophils Absolute: 0.2 10*3/uL (ref 0.0–0.7)
Eosinophils Relative: 4 % (ref 0.0–5.0)
HCT: 39.7 % (ref 36.0–46.0)
Hemoglobin: 13.5 g/dL (ref 12.0–15.0)
Lymphocytes Relative: 33.4 % (ref 12.0–46.0)
Lymphs Abs: 1.7 10*3/uL (ref 0.7–4.0)
MCHC: 34 g/dL (ref 30.0–36.0)
MCV: 93.3 fl (ref 78.0–100.0)
Monocytes Absolute: 0.5 10*3/uL (ref 0.1–1.0)
Monocytes Relative: 10 % (ref 3.0–12.0)
Neutro Abs: 2.7 10*3/uL (ref 1.4–7.7)
Neutrophils Relative %: 51.3 % (ref 43.0–77.0)
Platelets: 286 10*3/uL (ref 150.0–400.0)
RBC: 4.26 Mil/uL (ref 3.87–5.11)
RDW: 13.4 % (ref 11.5–15.5)
WBC: 5.2 10*3/uL (ref 4.0–10.5)

## 2020-01-27 LAB — COMPREHENSIVE METABOLIC PANEL
ALT: 25 U/L (ref 0–35)
AST: 27 U/L (ref 0–37)
Albumin: 4.3 g/dL (ref 3.5–5.2)
Alkaline Phosphatase: 65 U/L (ref 39–117)
BUN: 15 mg/dL (ref 6–23)
CO2: 28 mEq/L (ref 19–32)
Calcium: 9.6 mg/dL (ref 8.4–10.5)
Chloride: 102 mEq/L (ref 96–112)
Creatinine, Ser: 0.67 mg/dL (ref 0.40–1.20)
GFR: 87.4 mL/min (ref >60.00)
Glucose, Bld: 93 mg/dL (ref 70–99)
Potassium: 3.9 mEq/L (ref 3.5–5.1)
Sodium: 138 mEq/L (ref 135–145)
Total Bilirubin: 1.5 mg/dL — ABNORMAL HIGH (ref 0.2–1.2)
Total Protein: 6.9 g/dL (ref 6.0–8.3)

## 2020-01-27 LAB — VITAMIN D 25 HYDROXY (VIT D DEFICIENCY, FRACTURES): VITD: 55.85 ng/mL (ref 30.00–100.00)

## 2020-01-27 LAB — TSH: TSH: 2.49 u[IU]/mL (ref 0.35–4.50)

## 2020-01-30 DIAGNOSIS — H43813 Vitreous degeneration, bilateral: Secondary | ICD-10-CM | POA: Diagnosis not present

## 2020-01-30 DIAGNOSIS — H10413 Chronic giant papillary conjunctivitis, bilateral: Secondary | ICD-10-CM | POA: Diagnosis not present

## 2020-01-30 DIAGNOSIS — H25813 Combined forms of age-related cataract, bilateral: Secondary | ICD-10-CM | POA: Diagnosis not present

## 2020-01-30 DIAGNOSIS — H524 Presbyopia: Secondary | ICD-10-CM | POA: Diagnosis not present

## 2020-01-31 ENCOUNTER — Other Ambulatory Visit: Payer: Self-pay

## 2020-01-31 ENCOUNTER — Ambulatory Visit (INDEPENDENT_AMBULATORY_CARE_PROVIDER_SITE_OTHER): Payer: Medicare Other

## 2020-01-31 VITALS — BP 122/74 | Wt 112.0 lb

## 2020-01-31 DIAGNOSIS — Z0000 Encounter for general adult medical examination without abnormal findings: Secondary | ICD-10-CM

## 2020-01-31 NOTE — Patient Instructions (Signed)
Karen Rice , Thank you for taking time to come for your Medicare Wellness Visit. I appreciate your ongoing commitment to your health goals. Please review the following plan we discussed and let me know if I can assist you in the future.   Screening recommendations/referrals: Colonoscopy: Up to date, completed 07/14/2019 Mammogram: Up to date, completed 08/30/2019 Bone Density: completed 01/16/2016 Recommended yearly ophthalmology/optometry visit for glaucoma screening and checkup Recommended yearly dental visit for hygiene and checkup  Vaccinations: Influenza vaccine: Up to date, completed 07/26/2019 Pneumococcal vaccine: Completed series Tdap vaccine: Up to date, completed 09/15/2013 Shingles vaccine: discussed    Advanced directives: Please bring a copy of your POA (Power of [place]) and/or Living Will to your next appointment.   Conditions/risks identified: hypertension  Next appointment: 02/02/2020 @ 9:30 am    Preventive Care 65 Years and Older, Female Preventive care refers to lifestyle choices and visits with your health care provider that can promote health and wellness. What does preventive care include?  A yearly physical exam. This is also called an annual well check.  Dental exams once or twice a year.  Routine eye exams. Ask your health care provider how often you should have your eyes checked.  Personal lifestyle choices, including:  Daily care of your teeth and gums.  Regular physical activity.  Eating a healthy diet.  Avoiding tobacco and drug use.  Limiting alcohol use.  Practicing safe sex.  Taking low-dose aspirin every day.  Taking vitamin and mineral supplements as recommended by your health care provider. What happens during an annual well check? The services and screenings done by your health care provider during your annual well check will depend on your age, overall health, lifestyle risk factors, and family history of disease. Counseling  Your  health care provider [place] ask you questions about your:  Alcohol use.  Tobacco use.  Drug use.  Emotional well-being.  Home and relationship well-being.  Sexual activity.  Eating habits.  History of falls.  Memory and ability to understand (cognition).  Work and work Statistician.  Reproductive health. Screening  You [place] have the following tests or measurements:  Height, weight, and BMI.  Blood pressure.  Lipid and cholesterol levels. These Lwin be checked every 5 years, or more frequently if you are over 99 years old.  Skin check.  Lung cancer screening. You Burford have this screening every year starting at age 27 if you have a 30-pack-year history of smoking and currently smoke or have quit within the past 15 years.  Fecal occult blood test (FOBT) of the stool. You Lafrance have this test every year starting at age 31.  Flexible sigmoidoscopy or colonoscopy. You Leeth have a sigmoidoscopy every 5 years or a colonoscopy every 10 years starting at age 35.  Hepatitis C blood test.  Hepatitis B blood test.  Sexually transmitted disease (STD) testing.  Diabetes screening. This is done by checking your blood sugar (glucose) after you have not eaten for a while (fasting). You Portal have this done every 1-3 years.  Bone density scan. This is done to screen for osteoporosis. You Dutson have this done starting at age 54.  Mammogram. This Harrison be done every 1-2 years. Talk to your health care provider about how often you should have regular mammograms. Talk with your health care provider about your test results, treatment options, and if necessary, the need for more tests. Vaccines  Your health care provider [place] recommend certain vaccines, such as:  Influenza vaccine. This  is recommended every year.  Tetanus, diphtheria, and acellular pertussis (Tdap, Td) vaccine. You Payment need a Td booster every 10 years.  Zoster vaccine. You Boreman need this after age 87.  Pneumococcal 13-valent  conjugate (PCV13) vaccine. One dose is recommended after age 43.  Pneumococcal polysaccharide (PPSV23) vaccine. One dose is recommended after age 80. Talk to your health care provider about which screenings and vaccines you need and how often you need them. This information is not intended to replace advice given to you by your health care provider. Make sure you discuss any questions you have with your health care provider. Document Released: 11/23/2015 Document Revised: 07/16/2016 Document Reviewed: 08/28/2015 Elsevier Interactive Patient Education  2017 [place] Prevention in the Home Falls can cause injuries. They can happen to people of all ages. There are many things you can do to make your home safe and to help prevent falls. What can I do on the outside of my home?  Regularly fix the edges of walkways and driveways and fix any cracks.  Remove anything that might make you trip as you walk through a door, such as a raised step or threshold.  Trim any bushes or trees on the path to your home.  Use bright outdoor lighting.  Clear any walking paths of anything that might make someone trip, such as rocks or tools.  Regularly check to see if handrails are loose or broken. Make sure that both sides of any steps have handrails.  Any raised decks and porches should have guardrails on the edges.  Have any leaves, snow, or ice cleared regularly.  Use sand or salt on walking paths during winter.  Clean up any spills in your garage right away. This includes oil or grease spills. What can I do in the bathroom?  Use night lights.  Install grab bars by the toilet and in the tub and shower. Do not use towel bars as grab bars.  Use non-skid mats or decals in the tub or shower.  If you need to sit down in the shower, use a plastic, non-slip stool.  Keep the floor dry. Clean up any water that spills on the floor as soon as it happens.  Remove soap buildup in the tub or  shower regularly.  Attach bath mats securely with double-sided non-slip rug tape.  Do not have throw rugs and other things on the floor that can make you trip. What can I do in the bedroom?  Use night lights.  Make sure that you have a light by your bed that is easy to reach.  Do not use any sheets or blankets that are too big for your bed. They should not hang down onto the floor.  Have a firm chair that has side arms. You can use this for support while you get dressed.  Do not have throw rugs and other things on the floor that can make you trip. What can I do in the kitchen?  Clean up any spills right away.  Avoid walking on wet floors.  Keep items that you use a lot in easy-to-reach places.  If you need to reach something above you, use a strong step stool that has a grab bar.  Keep electrical cords out of the way.  Do not use floor polish or wax that makes floors slippery. If you must use wax, use non-skid floor wax.  Do not have throw rugs and other things on the floor that can make you  trip. What can I do with my stairs?  Do not leave any items on the stairs.  Make sure that there are handrails on both sides of the stairs and use them. Fix handrails that are broken or loose. Make sure that handrails are as long as the stairways.  Check any carpeting to make sure that it is firmly attached to the stairs. Fix any carpet that is loose or worn.  Avoid having throw rugs at the top or bottom of the stairs. If you do have throw rugs, attach them to the floor with carpet tape.  Make sure that you have a light switch at the top of the stairs and the bottom of the stairs. If you do not have them, ask someone to add them for you. What else can I do to help prevent falls?  Wear shoes that:  Do not have high heels.  Have rubber bottoms.  Are comfortable and fit you well.  Are closed at the toe. Do not wear sandals.  If you use a stepladder:  Make sure that it is fully  opened. Do not climb a closed stepladder.  Make sure that both sides of the stepladder are locked into place.  Ask someone to hold it for you, if possible.  Clearly mark and make sure that you can see:  Any grab bars or handrails.  First and last steps.  Where the edge of each step is.  Use tools that help you move around (mobility aids) if they are needed. These include:  Canes.  Walkers.  Scooters.  Crutches.  Turn on the lights when you go into a dark area. Replace any light bulbs as soon as they burn out.  Set up your furniture so you have a clear path. Avoid moving your furniture around.  If any of your floors are uneven, fix them.  If there are any pets around you, be aware of where they are.  Review your medicines with your doctor. Some medicines can make you feel dizzy. This can increase your chance of falling. Ask your doctor what other things that you can do to help prevent falls. This information is not intended to replace advice given to you by your health care provider. Make sure you discuss any questions you have with your health care provider. Document Released: 08/23/2009 Document Revised: 04/03/2016 Document Reviewed: 12/01/2014 Elsevier Interactive Patient Education  2017 [employer].

## 2020-01-31 NOTE — Progress Notes (Signed)
Subjective:   Karen Rice is a 69 y.o. female who presents for Medicare Annual (Subsequent) preventive examination.  Review of Systems: N/A   This visit is being conducted through telemedicine via telephone at the nurse health advisor's home address due to the COVID-19 pandemic. This patient has given me verbal consent via doximity to conduct this visit, patient states they are participating from their home address. Patient and myself are on the telephone call. There is no referral for this visit. Some vital signs [place] be absent or patient reported.    Patient identification: identified by name, DOB, and current address   Cardiac Risk Factors include: advanced age (>77men, >80 women);hypertension     Objective:     Vitals: BP 122/74   Wt 112 lb (50.8 kg)   LMP 11/10/2002   BMI 19.84 kg/m   Body mass index is 19.84 kg/m.  Advanced Directives 01/31/2020 10/29/2018  Does Patient Have a Medical Advance Directive? Yes No  Type of Paramedic of [place];Living will -  Copy of Laura in Chart? No - copy requested -  Would patient like information on creating a medical advance directive? - No - Patient declined    Tobacco Social History   Tobacco Use  Smoking Status Former Smoker  . Quit date: 11/10/2000  . Years since quitting: 19.2  Smokeless Tobacco Never Used     Counseling given: Not Answered   Clinical Intake:  Pre-visit preparation completed: Yes  Pain : No/denies pain     Nutritional Risks: None Diabetes: No  How often do you need to have someone help you when you read instructions, pamphlets, or other written materials from your doctor or pharmacy?: 1 - Never What is the last grade level you completed in school?: Masters  Interpreter Needed?: No  Information entered by :: CJohnson, LPN  Past Medical History:  Diagnosis Date  . Allergy    allergic rhinitis  . Family history of breast cancer   . Family history  of uterine cancer   . Gilbert's syndrome    with high bilirubin  . Hypertension   . Osteoporosis    Past Surgical History:  Procedure Laterality Date  . TONSILLECTOMY AND ADENOIDECTOMY  1957   Family History  Problem Relation Age of Onset  . Heart disease Mother        heart attack  . Hypertension Mother   . Breast cancer Mother        ages 36 and 70  . Hypertension Father   . Heart disease Maternal Grandfather        MI  . Hypertension Sister   . Osteopenia Sister   . Lung cancer Paternal Uncle        heavy smoker  . Heart disease Maternal Aunt   . Dementia Paternal Grandmother   . Uterine cancer Cousin        dx in her 92s  . Uterine cancer Other        MGM's maternal aunt   Social History   Socioeconomic History  . Marital status: Divorced    Spouse name: Not on file  . Number of children: Not on file  . Years of education: Not on file  . Highest education level: Not on file  Occupational History  . Not on file  Tobacco Use  . Smoking status: Former Smoker    Quit date: 11/10/2000    Years since quitting: 19.2  . Smokeless tobacco: Never  Used  Substance and Sexual Activity  . Alcohol use: Yes    Alcohol/week: 7.0 standard drinks    Types: 7 Glasses of wine per week  . Drug use: No  . Sexual activity: Yes    Partners: Female    Birth control/protection: Post-menopausal  Other Topics Concern  . Not on file  Social History Narrative  . Not on file   Social Determinants of Health   Financial Resource Strain: Low Risk   . Difficulty of Paying Living Expenses: Not hard at all  Food Insecurity: No Food Insecurity  . Worried About Charity fundraiser in the Last Year: Never true  . Ran Out of Food in the Last Year: Never true  Transportation Needs: No Transportation Needs  . Lack of Transportation (Medical): No  . Lack of Transportation (Non-Medical): No  Physical Activity: Sufficiently Active  . Days of Exercise per Week: 7 days  . Minutes of Exercise  per Session: 30 min  Stress: No Stress Concern Present  . Feeling of Stress : Not at all  Social Connections:   . Frequency of Communication with Friends and Family:   . Frequency of Social Gatherings with Friends and Family:   . Attends Religious Services:   . Active Member of Clubs or Organizations:   . Attends Archivist Meetings:   Marland Kitchen Marital Status:     Outpatient Encounter Medications as of 01/31/2020  Medication Sig  . Ascorbic Acid (VITAMIN C) 1000 MG tablet Take 1,000 mg by mouth daily.  . Cholecalciferol (VITAMIN D) 2000 UNITS tablet Take 2,000 Units by mouth daily.  . Multiple Vitamin (MULTIVITAMIN) capsule Take 1 capsule by mouth daily.   Marland Kitchen olmesartan (BENICAR) 20 MG tablet TAKE 1 TABLET BY MOUTH EVERY DAY  . tretinoin (RETIN-A) 0.05 % cream APPLY TO THE FACE DAILY  . triamcinolone (KENALOG) 0.025 % ointment Apply 1 application topically 2 (two) times daily. (Patient not taking: Reported on 01/31/2020)   Facility-Administered Encounter Medications as of 01/31/2020  Medication  . 0.9 %  sodium chloride infusion    Activities of Daily Living In your present state of health, do you have any difficulty performing the following activities: 01/31/2020  Hearing? N  Vision? N  Difficulty concentrating or making decisions? N  Walking or climbing stairs? N  Dressing or bathing? N  Doing errands, shopping? N  Preparing Food and eating ? N  Using the Toilet? N  In the past six months, have you accidently leaked urine? N  Do you have problems with loss of bowel control? N  Managing your Medications? N  Managing your Finances? N  Housekeeping or managing your Housekeeping? N  Some recent data might be hidden    Patient Care Team: Tower, Wynelle Fanny, MD as PCP - General    Assessment:   This is a routine wellness examination for Karen Rice.  Exercise Activities and Dietary recommendations Current Exercise Habits: Home exercise routine, Type of exercise: walking, Time  (Minutes): 30, Frequency (Times/Week): 7, Weekly Exercise (Minutes/Week): 210, Intensity: Moderate, Exercise limited by: None identified  Goals    . Patient Stated     01/31/2020, I will continue to walk my dogs everyday for 2 1/2 miles.        Fall Risk Fall Risk  01/31/2020 12/02/2018 09/24/2018 10/09/2017 10/06/2016  Falls in the past year? 0 0 0 Yes No  Comment - - Emmi Telephone Survey: data to providers prior to load - -  Number falls in  past yr: 0 0 - 1 -  Injury with Fall? 0 - - No -  Risk for fall due to : Medication side effect - - - -  Follow up Falls prevention discussed;Falls evaluation completed - - Falls evaluation completed -   Is the patient's home free of loose throw rugs in walkways, pet beds, electrical cords, etc?   yes      Grab bars in the bathroom? no      Handrails on the stairs?   yes      Adequate lighting?   yes  Timed Get Up and Go performed: N/A  Depression Screen PHQ 2/9 Scores 01/31/2020 12/02/2018 10/09/2017 10/06/2016  PHQ - 2 Score 0 0 0 0  PHQ- 9 Score 0 - - -     Cognitive Function MMSE - Mini Mental State Exam 01/31/2020  Orientation to time 5  Orientation to Place 5  Registration 3  Attention/ Calculation 5  Recall 3  Language- repeat 1       Mini Cog  Mini-Cog screen was completed. Maximum score is 22. A value of 0 denotes this part of the MMSE was not completed or the patient failed this part of the Mini-Cog screening.  Immunization History  Administered Date(s) Administered  . Hep A / Hep B 12/06/2004, 01/10/2005, 06/13/2005  . Influenza Split 10/20/2011, 07/26/2012  . Influenza Whole 08/14/2010  . Influenza, High Dose Seasonal PF 08/26/2017, 09/07/2018  . Influenza,inj,Quad PF,6+ Mos 08/31/2013, 09/08/2014, 09/18/2015, 10/06/2016, 07/26/2019  . Moderna SARS-COVID-2 Vaccination 01/02/2020  . Pneumococcal Conjugate-13 11/12/2016  . Pneumococcal Polysaccharide-23 09/15/2019  . Td 11/11/1999, 07/18/2010  . Tdap 09/15/2013  .  Zoster 03/23/2013    Qualifies for Shingles Vaccine: Yes  Screening Tests Health Maintenance  Topic Date Due  . MAMMOGRAM  08/29/2020  . TETANUS/TDAP  09/16/2023  . COLONOSCOPY  07/13/2029  . INFLUENZA VACCINE  Completed  . DEXA SCAN  Completed  . Hepatitis C Screening  Completed  . PNA vac Low Risk Adult  Completed    Cancer Screenings: Lung: Low Dose CT Chest recommended if Age 88-80 years, 30 pack-year currently smoking OR have quit w/in 15 years. Patient does not qualify. Breast:  Up to date on Mammogram: Yes, completed 08/30/2019   Bone  Density/Dexa: completed 01/16/2016 Colorectal: completed 07/14/2019  Additional Screenings:  Hepatitis C Screening: 07/01/2016     Plan:    Patient will continue to walk her dogs everyday for 2 1/2 miles.   I have personally reviewed and noted the following in the patient's chart:   . Medical and social history . Use of alcohol, tobacco or illicit drugs  . Current medications and supplements . Functional ability and status . Nutritional status . Physical activity . Advanced directives . List of other physicians . Hospitalizations, surgeries, and ER visits in previous 12 months . Vitals . Screenings to include cognitive, depression, and falls . Referrals and appointments  In addition, I have reviewed and discussed with patient certain preventive protocols, quality metrics, and best practice recommendations. A written personalized care plan for preventive services as well as general preventive health recommendations were provided to patient.     Andrez Grime, LPN  [phone]

## 2020-01-31 NOTE — Progress Notes (Addendum)
PCP notes:  Health Maintenance: Dexa?   Abnormal Screenings: none   Patient concerns: Discuss recommended rehab facilities and home health agencies   Nurse concerns: none   Next PCP appt: 02/02/2020 @ 9:30 am   I reviewed health advisor's note, was available for consultation, and agree with documentation and plan. Loura Pardon MD

## 2020-02-02 ENCOUNTER — Ambulatory Visit (INDEPENDENT_AMBULATORY_CARE_PROVIDER_SITE_OTHER): Payer: Medicare Other | Admitting: Family Medicine

## 2020-02-02 ENCOUNTER — Other Ambulatory Visit: Payer: Self-pay

## 2020-02-02 ENCOUNTER — Encounter: Payer: Self-pay | Admitting: Family Medicine

## 2020-02-02 VITALS — BP 130/88 | HR 83 | Temp 97.4°F | Ht 63.0 in | Wt 114.4 lb

## 2020-02-02 DIAGNOSIS — I10 Essential (primary) hypertension: Secondary | ICD-10-CM

## 2020-02-02 DIAGNOSIS — M810 Age-related osteoporosis without current pathological fracture: Secondary | ICD-10-CM | POA: Diagnosis not present

## 2020-02-02 DIAGNOSIS — Z1211 Encounter for screening for malignant neoplasm of colon: Secondary | ICD-10-CM | POA: Diagnosis not present

## 2020-02-02 MED ORDER — OLMESARTAN MEDOXOMIL 20 MG PO TABS: 20.0000 mg | ORAL_TABLET | Freq: Every day | ORAL | 3 refills | Status: DC

## 2020-02-02 NOTE — Assessment & Plan Note (Signed)
utd colonoscopy 9/20  10 y recall if she wants to re check

## 2020-02-02 NOTE — Assessment & Plan Note (Signed)
Pt plans to have her gyn order the next dexa  Previously on a bisphosphonate  D level is tx  No falls/fx Good exercise habits

## 2020-02-02 NOTE — Progress Notes (Signed)
Subjective:    Patient ID: Karen Rice, female    DOB: 1951/08/20, 69 y.o.   MRN: [id number]  This visit occurred during the SARS-CoV-2 public health emergency.  Safety protocols were in place, including screening questions prior to the visit, additional usage of staff PPE, and extensive cleaning of exam room while observing appropriate contact time as indicated for disinfecting solutions.    HPI Pt presents for annual f/u of chronic medical problems   Lost her partner to breast cancer in January  Doing fair with grief  Not in therapy right now  Has a good support group  A lot of executer duties    Wt Readings from Last 3 Encounters:  02/02/20 114 lb 6.4 oz (51.9 kg)  01/31/20 112 lb (50.8 kg)  07/14/19 115 lb (52.2 kg)  taking good care of herself  Eating well  Good sleep   20.27 kg/m   Lives on one floor by herself and 2 dogs Thinking about the future    Had amw on 01/31/20 Disc dexa /when to get   Mammogram 10/20 Self breast exam - no lumps  Mother had breast cancer  Pt has had genetic testing   dexa 3/17= Osteoporosis - gyn will order it  Has taken fosamax in the past  Falls -none Fractures-none  Supplements - taking D  D level 55 Exercise - every day   Colonoscopy 9/20 --10 y recall    zostavax 5/14 Interested in shingrix    Had first covid vaccine 2/22  bp is stable today  No cp or palpitations or headaches or edema  No side effects to medicines  BP Readings from Last 3 Encounters:  02/02/20 130/88  01/31/20 122/74  07/14/19 121/68     Cholesterol Lab Results  Component Value Date   CHOL 191 01/27/2020   CHOL 192 10/19/2018   CHOL 191 10/15/2017   Lab Results  Component Value Date   HDL 95.00 01/27/2020   HDL 101.60 10/19/2018   HDL 103.70 10/15/2017   Lab Results  Component Value Date   LDLCALC 86 01/27/2020   LDLCALC 80 10/19/2018   LDLCALC 80 10/15/2017   Lab Results  Component Value Date   TRIG 50.0 01/27/2020   TRIG 51.0  10/19/2018   TRIG 39.0 10/15/2017   Lab Results  Component Value Date   CHOLHDL 2 01/27/2020   CHOLHDL 2 10/19/2018   CHOLHDL 2 10/15/2017   Lab Results  Component Value Date   LDLDIRECT 94.6 07/19/2012   LDLDIRECT 89.7 12/26/2008   Other labs Results for orders placed or performed in visit on 01/27/20  VITAMIN D 25 Hydroxy (Vit-D Deficiency, Fractures)  Result Value Ref Range   VITD 55.85 30.00 - 100.00 ng/mL  TSH  Result Value Ref Range   TSH 2.49 0.35 - 4.50 uIU/mL  Lipid panel  Result Value Ref Range   Cholesterol 191 0 - 200 mg/dL   Triglycerides 50.0 0.0 - 149.0 mg/dL   HDL 95.00 >39.00 mg/dL   VLDL 10.0 0.0 - 40.0 mg/dL   LDL Cholesterol 86 0 - 99 mg/dL   Total CHOL/HDL Ratio 2    NonHDL 96.10   Comprehensive metabolic panel  Result Value Ref Range   Sodium 138 135 - 145 mEq/L   Potassium 3.9 3.5 - 5.1 mEq/L   Chloride 102 96 - 112 mEq/L   CO2 28 19 - 32 mEq/L   Glucose, Bld 93 70 - 99 mg/dL   BUN 15 6 -  23 mg/dL   Creatinine, Ser 0.67 0.40 - 1.20 mg/dL   Total Bilirubin 1.5 (H) 0.2 - 1.2 mg/dL   Alkaline Phosphatase 65 39 - 117 U/L   AST 27 0 - 37 U/L   ALT 25 0 - 35 U/L   Total Protein 6.9 6.0 - 8.3 g/dL   Albumin 4.3 3.5 - 5.2 g/dL   GFR 87.40 >60.00 mL/min   Calcium 9.6 8.4 - 10.5 mg/dL  CBC with Differential/Platelet  Result Value Ref Range   WBC 5.2 4.0 - 10.5 K/uL   RBC 4.26 3.87 - 5.11 Mil/uL   Hemoglobin 13.5 12.0 - 15.0 g/dL   HCT 39.7 36.0 - 46.0 %   MCV 93.3 78.0 - 100.0 fl   MCHC 34.0 30.0 - 36.0 g/dL   RDW 13.4 11.5 - 15.5 %   Platelets 286.0 150.0 - 400.0 K/uL   Neutrophils Relative % 51.3 43.0 - 77.0 %   Lymphocytes Relative 33.4 12.0 - 46.0 %   Monocytes Relative 10.0 3.0 - 12.0 %   Eosinophils Relative 4.0 0.0 - 5.0 %   Basophils Relative 1.3 0.0 - 3.0 %   Neutro Abs 2.7 1.4 - 7.7 K/uL   Lymphs Abs 1.7 0.7 - 4.0 K/uL   Monocytes Absolute 0.5 0.1 - 1.0 K/uL   Eosinophils Absolute 0.2 0.0 - 0.7 K/uL   Basophils Absolute 0.1 0.0  - 0.1 K/uL     Patient Active Problem List   Diagnosis Date Noted  . Medicare annual wellness visit, initial 12/02/2018  . Colon cancer screening 12/02/2018  . Colitis 11/02/2018  . Diarrhea 10/29/2018  . Genetic testing 08/26/2018  . Family history of breast cancer   . Family history of uterine cancer   . Welcome to Medicare preventive visit 10/06/2016  . Estrogen deficiency 09/18/2015  . Hypertension 03/03/2011  . Routine general medical examination at a health care facility 03/03/2011  . ALLERGIC RHINITIS 07/21/2007  . Osteoporosis 07/21/2007   Past Medical History:  Diagnosis Date  . Allergy    allergic rhinitis  . Family history of breast cancer   . Family history of uterine cancer   . Gilbert's syndrome    with high bilirubin  . Hypertension   . Osteoporosis    Past Surgical History:  Procedure Laterality Date  . TONSILLECTOMY AND ADENOIDECTOMY  1957   Social History   Tobacco Use  . Smoking status: Former Smoker    Quit date: 11/10/2000    Years since quitting: 19.2  . Smokeless tobacco: Never Used  Substance Use Topics  . Alcohol use: Yes    Alcohol/week: 7.0 standard drinks    Types: 7 Glasses of wine per week  . Drug use: No   Family History  Problem Relation Age of Onset  . Heart disease Mother        heart attack  . Hypertension Mother   . Breast cancer Mother        ages 66 and 89  . Hypertension Father   . Heart disease Maternal Grandfather        MI  . Hypertension Sister   . Osteopenia Sister   . Lung cancer Paternal Uncle        heavy smoker  . Heart disease Maternal Aunt   . Dementia Paternal Grandmother   . Uterine cancer Cousin        dx in her 76s  . Uterine cancer Other        MGM's maternal aunt  Allergies  Allergen Reactions  . Boniva [Ibandronic Acid] Nausea Only  . Lisinopril Other (See Comments)    Severe fatigue  . Norvasc [Amlodipine Besylate]     Fatigue/ depression   Current Outpatient Medications on File Prior  to Visit  Medication Sig Dispense Refill  . Cholecalciferol (VITAMIN D) 2000 UNITS tablet Take 2,000 Units by mouth daily.    . Multiple Vitamin (MULTIVITAMIN) capsule Take 1 capsule by mouth daily.     Marland Kitchen tretinoin (RETIN-A) 0.05 % cream APPLY TO THE FACE DAILY     Current Facility-Administered Medications on File Prior to Visit  Medication Dose Route Frequency Provider Last Rate Last Admin  . 0.9 %  sodium chloride infusion  500 mL Intravenous Once Nandigam, Kavitha V, MD        Review of Systems  Constitutional: Negative for activity change, appetite change, fatigue, fever and unexpected weight change.  HENT: Negative for congestion, ear pain, rhinorrhea, sinus pressure and sore throat.   Eyes: Negative for pain, redness and visual disturbance.  Respiratory: Negative for cough, shortness of breath and wheezing.   Cardiovascular: Negative for chest pain and palpitations.  Gastrointestinal: Negative for abdominal pain, blood in stool, constipation and diarrhea.  Endocrine: Negative for polydipsia and polyuria.  Genitourinary: Negative for dysuria, frequency and urgency.  Musculoskeletal: Negative for arthralgias, back pain and myalgias.  Skin: Negative for pallor and rash.  Allergic/Immunologic: Negative for environmental allergies.  Neurological: Negative for dizziness, syncope and headaches.  Hematological: Negative for adenopathy. Does not bruise/bleed easily.  Psychiatric/Behavioral: Negative for decreased concentration and dysphoric mood. The patient is not nervous/anxious.        Grief       Objective:   Physical Exam Constitutional:      General: She is not in acute distress.    Appearance: Normal appearance. She is well-developed and normal weight. She is not ill-appearing or diaphoretic.  HENT:     Head: Normocephalic and atraumatic.     Right Ear: Tympanic membrane, ear canal and external ear normal.     Left Ear: Tympanic membrane, ear canal and external ear normal.      Nose: Nose normal. No congestion.     Mouth/Throat:     Mouth: Mucous membranes are moist.     Pharynx: Oropharynx is clear. No posterior oropharyngeal erythema.  Eyes:     General: No scleral icterus.    Extraocular Movements: Extraocular movements intact.     Conjunctiva/sclera: Conjunctivae normal.     Pupils: Pupils are equal, round, and reactive to light.  Neck:     Thyroid: No thyromegaly.     Vascular: No carotid bruit or JVD.  Cardiovascular:     Rate and Rhythm: Normal rate and regular rhythm.     Pulses: Normal pulses.     Heart sounds: Normal heart sounds. No gallop.   Pulmonary:     Effort: Pulmonary effort is normal. No respiratory distress.     Breath sounds: Normal breath sounds. No wheezing.     Comments: Good air exch Chest:     Chest wall: No tenderness.  Abdominal:     General: Bowel sounds are normal. There is no distension or abdominal bruit.     Palpations: Abdomen is soft. There is no mass.     Tenderness: There is no abdominal tenderness.     Hernia: No hernia is present.  Genitourinary:    Comments: Breast exam: No mass, nodules, thickening, tenderness, bulging, retraction, inflamation, nipple discharge  or skin changes noted.  No axillary or clavicular LA.     Musculoskeletal:        General: No tenderness. Normal range of motion.     Cervical back: Normal range of motion and neck supple. No rigidity. No muscular tenderness.     Right lower leg: No edema.     Left lower leg: No edema.  Lymphadenopathy:     Cervical: No cervical adenopathy.  Skin:    General: Skin is warm and dry.     Coloration: Skin is not pale.     Findings: No erythema or rash.     Comments: Solar lentigines diffusely Mildly tanned  Neurological:     Mental Status: She is alert. Mental status is at baseline.     Cranial Nerves: No cranial nerve deficit.     Motor: No abnormal muscle tone.     Coordination: Coordination normal.     Gait: Gait normal.     Deep Tendon  Reflexes: Reflexes are normal and symmetric. Reflexes normal.  Psychiatric:        Attention and Perception: Attention normal.        Mood and Affect: Mood normal.        Cognition and Memory: Cognition and memory normal.     Comments: Candidly discusses grief  Not tearful  Pleasant and mentally sharp            Assessment & Plan:   Problem List Items Addressed This Visit      Cardiovascular and Mediastinum   Hypertension - Primary    bp in fair control at this time  BP Readings from Last 1 Encounters:  02/02/20 130/88   No changes needed Most recent labs reviewed  Disc lifstyle change with low sodium diet and exercise  Refilled olmesartan      Relevant Medications   olmesartan (BENICAR) 20 MG tablet     Musculoskeletal and Integument   Osteoporosis    Pt plans to have her gyn order the next dexa  Previously on a bisphosphonate  D level is tx  No falls/fx Good exercise habits        Other   Colon cancer screening    utd colonoscopy 9/20  10 y recall if she wants to re check

## 2020-02-02 NOTE — Assessment & Plan Note (Signed)
bp in fair control at this time  BP Readings from Last 1 Encounters:  02/02/20 130/88   No changes needed Most recent labs reviewed  Disc lifstyle change with low sodium diet and exercise  Refilled olmesartan

## 2020-02-02 NOTE — Patient Instructions (Signed)
Keep taking good care of yourself   Let us know if you want grief counseling in the future Stay active (body and brain)   Get outdoors as much as possibly

## 2020-02-10 ENCOUNTER — Telehealth: Payer: Self-pay | Admitting: Genetic Counselor

## 2020-02-10 NOTE — Telephone Encounter (Signed)
I returned the call to Miesha Sheer.  She reported that Pam died in 01-01-23, and now she is trying to get information for Pam's brother's granddaughters who need to undergo genetic testing.  She was wondering about website's that would be reputable.  I directed her to [employer] and [employer] as two website's that are ones we recommend.  If she can give me the zip code of where the great nieces are located, we can find facilities for them to go to in order to be tested. She will call me back.

## 2020-02-14 ENCOUNTER — Ambulatory Visit: Payer: Medicare Other | Attending: Family

## 2020-02-14 DIAGNOSIS — Z23 Encounter for immunization: Secondary | ICD-10-CM

## 2020-02-14 NOTE — Progress Notes (Signed)
   Covid-19 Vaccination Clinic  Name:  Karen Rice    MRN: [id number] DOB: December 17, 1950  02/14/2020  Karen Rice was observed post Covid-19 immunization for 15 minutes without incident. She was provided with Vaccine Information Sheet and instruction to access the V-Safe system.   Karen Rice was instructed to call 911 with any severe reactions post vaccine: Marland Kitchen Difficulty breathing  . Swelling of face and throat  . A fast heartbeat  . A bad rash all over body  . Dizziness and weakness   Immunizations Administered    Name Date Dose VIS Date Route   Moderna COVID-19 Vaccine 02/14/2020 11:07 AM 0.5 mL 10/11/2019 Intramuscular   Manufacturer: Moderna   Lot: [id number]   [id number]

## 2020-07-05 DIAGNOSIS — L820 Inflamed seborrheic keratosis: Secondary | ICD-10-CM | POA: Diagnosis not present

## 2020-07-05 DIAGNOSIS — L570 Actinic keratosis: Secondary | ICD-10-CM | POA: Diagnosis not present

## 2020-07-05 DIAGNOSIS — D485 Neoplasm of uncertain behavior of skin: Secondary | ICD-10-CM | POA: Diagnosis not present

## 2020-07-05 DIAGNOSIS — L814 Other melanin hyperpigmentation: Secondary | ICD-10-CM | POA: Diagnosis not present

## 2020-07-05 DIAGNOSIS — L821 Other seborrheic keratosis: Secondary | ICD-10-CM | POA: Diagnosis not present

## 2020-07-05 DIAGNOSIS — D1801 Hemangioma of skin and subcutaneous tissue: Secondary | ICD-10-CM | POA: Diagnosis not present

## 2020-07-05 DIAGNOSIS — D224 Melanocytic nevi of scalp and neck: Secondary | ICD-10-CM | POA: Diagnosis not present

## 2020-07-27 ENCOUNTER — Other Ambulatory Visit: Payer: Self-pay | Admitting: Obstetrics & Gynecology

## 2020-07-27 DIAGNOSIS — Z1231 Encounter for screening mammogram for malignant neoplasm of breast: Secondary | ICD-10-CM

## 2020-08-03 ENCOUNTER — Telehealth: Payer: Self-pay

## 2020-08-03 NOTE — Telephone Encounter (Signed)
Pt left v/m wanting to know when pt should get flu shot and when should pt get covid booster; pt had Moderna on 01/02/20 & 02/14/20.pt request cb after Dr Glori Bickers reviews.

## 2020-08-03 NOTE — Telephone Encounter (Signed)
Can get a flu shot now  She does not need to separate that from covid shot for any specified period of time  I do not have definitive info/recommendation for moderna booster yet    (FDA and CDC will need to come to a consensus as they have for [place]) I'm sure we will be hearing soon

## 2020-08-03 NOTE — Telephone Encounter (Signed)
Pt notified of Dr. Tower's comments  

## 2020-08-16 ENCOUNTER — Ambulatory Visit (INDEPENDENT_AMBULATORY_CARE_PROVIDER_SITE_OTHER): Payer: Medicare Other

## 2020-08-16 ENCOUNTER — Ambulatory Visit: Payer: Medicare Other

## 2020-08-16 DIAGNOSIS — Z23 Encounter for immunization: Secondary | ICD-10-CM | POA: Diagnosis not present

## 2020-08-28 NOTE — Progress Notes (Signed)
69 y.o. [id number] Divorced White or Caucasian female here for breast and pelvic exam. Denies vaginal bleeding.    Partner, Pam, died earlier this year.  Dealing with the grief but finds it very hard some days.  Has lots of family and friend support.  Celebration of life had to be postponed.    Pt had genetic testing about two years ago that was negative.  Tyrer Cusick model done with pt today as I thought she might have intermediate risk for breast cancer due to family history.  Lifetime risk was <10% so limited breast MRIs are not recommended for her.    Does have some issues with hemorrhoids.  Treatment options discussed.  Would like prescriptions.    Patient's last menstrual period was 11/10/2002.          Sexually active: No.  H/O STD:  no  Health Maintenance: PCP:  Loura Pardon  Last wellness appt was 01/2020  Did blood work at that appt: yes  Vaccines are up to date:  yes Colonoscopy: 07-14-2019 f/u 29yrs MMG:  08-30-2020 (result not in yet) BMD:  01-16-16 osteoporosis.  Will have this done after her appt with Dr. Glori Bickers.   Last pap smear:  06-17-16 neg HPV HR neg, 06-17-2019 neg H/o abnormal pap smear:  no    reports that she quit smoking about 19 years ago. She has never used smokeless tobacco. She reports current alcohol use of about 7.0 standard drinks of alcohol per week. She reports that she does not use drugs.  Past Medical History:  Diagnosis Date  . Allergy    allergic rhinitis  . Family history of breast cancer   . Family history of uterine cancer   . Gilbert's syndrome    with high bilirubin  . Hypertension   . Osteoporosis     Past Surgical History:  Procedure Laterality Date  . TONSILLECTOMY AND ADENOIDECTOMY  1957    Current Outpatient Medications  Medication Sig Dispense Refill  . Ascorbic Acid (VITAMIN C PO) Take by mouth.    . Multiple Vitamin (MULTIVITAMIN) capsule Take 1 capsule by mouth daily.     Marland Kitchen olmesartan (BENICAR) 20 MG tablet Take 1 tablet (20 mg  total) by mouth daily. 90 tablet 3  . tretinoin (RETIN-A) 0.05 % cream APPLY TO THE FACE DAILY    . VITAMIN D PO Take 3,000 Int'l Units by mouth.     Current Facility-Administered Medications  Medication Dose Route Frequency Provider Last Rate Last Admin  . 0.9 %  sodium chloride infusion  500 mL Intravenous Once Nandigam, Venia Minks, MD        Family History  Problem Relation Age of Onset  . Heart disease Mother        heart attack  . Hypertension Mother   . Breast cancer Mother        ages 54 and 20  . Hypertension Father   . Heart disease Maternal Grandfather        MI  . Hypertension Sister   . Osteopenia Sister   . Lung cancer Paternal Uncle        heavy smoker  . Heart disease Maternal Aunt   . Dementia Paternal Grandmother   . Uterine cancer Cousin        dx in her 55s  . Uterine cancer Other        MGM's maternal aunt    Review of Systems  Constitutional: Negative.   HENT: Negative.   Eyes: Negative.  Respiratory: Negative.   Cardiovascular: Negative.   Gastrointestinal: Negative.   Endocrine: Negative.   Genitourinary: Negative.   Musculoskeletal: Negative.   Skin: Negative.   Allergic/Immunologic: Negative.   Neurological: Negative.   Hematological: Negative.   Psychiatric/Behavioral: Negative.     Exam:   BP 114/70   Pulse 68   Resp 16   Ht 5\' 3"  (1.6 m)   Wt 114 lb (51.7 kg)   LMP 11/10/2002   BMI 20.19 kg/m   Height: 5\' 3"  (160 cm)  General appearance: alert, cooperative and appears stated age Breasts: normal appearance, no masses or tenderness Abdomen: soft, non-tender; bowel sounds normal; no masses,  no organomegaly Lymph nodes: Cervical, supraclavicular, and axillary nodes normal.  No abnormal inguinal nodes palpated Neurologic: Grossly normal  Pelvic: External genitalia:  no lesions              Urethra:  normal appearing urethra with no masses, tenderness or lesions              Bartholins and Skenes: normal                 Vagina:  normal appearing vagina with normal color and discharge, no lesions              Cervix: no lesions              Pap taken: No. Bimanual Exam:  Uterus:  normal size, contour, position, consistency, mobility, non-tender              Adnexa: normal adnexa and no mass, fullness, tenderness               Rectovaginal: Confirms               Anus:  normal sphincter tone, no lesions  Chaperone, Royal Hawthorn, CMA, was present for exam.  A:  Breast and Pelvic exam PMP, no HRT Osteoporosis, used fosamax Family hx of breast cancer in her mother, lifetime risk of breast cancer is 7.6% H/O colitis 12/19 Hemorrhoids  P:   Mammogram guidelines.  Doing 3D MMG. pap smear not indicated.  Normal pap 06/2019. Colonoscopy done 2020.  Follow up 10 years Will plan BMD again after seeing Dr. Glori Bickers. Lab work done with Dr. Glori Bickers as well.   Vaccines up to date. Rx for topical hydrocortisone cream 2.5 % topically 4 times daily with hemorrhoid flare and lidocaine 5% topical ointment q 8 hours prn hemorrhoid pain.  Pt aware to use lidocaine sparingly and only for a few days. return annually or prn  29 minutes of total time was spent for this patient encounter, including preparation, face-to-face counseling with the patient and coordination of care, and documentation of the encounter.

## 2020-08-30 ENCOUNTER — Ambulatory Visit
Admission: RE | Admit: 2020-08-30 | Discharge: 2020-08-30 | Disposition: A | Discharge status: Home / Self Care | Payer: Medicare Other | Source: Ambulatory Visit | Attending: Obstetrics & Gynecology | Admitting: Obstetrics & Gynecology

## 2020-08-30 ENCOUNTER — Other Ambulatory Visit: Payer: Self-pay

## 2020-08-30 DIAGNOSIS — Z1231 Encounter for screening mammogram for malignant neoplasm of breast: Secondary | ICD-10-CM | POA: Diagnosis not present

## 2020-08-31 ENCOUNTER — Ambulatory Visit (INDEPENDENT_AMBULATORY_CARE_PROVIDER_SITE_OTHER): Payer: Medicare Other | Admitting: Obstetrics & Gynecology

## 2020-08-31 ENCOUNTER — Encounter: Payer: Self-pay | Admitting: Obstetrics & Gynecology

## 2020-08-31 VITALS — BP 114/70 | HR 68 | Resp 16 | Ht 63.0 in | Wt 114.0 lb

## 2020-08-31 DIAGNOSIS — Z803 Family history of malignant neoplasm of breast: Secondary | ICD-10-CM | POA: Diagnosis not present

## 2020-08-31 DIAGNOSIS — K648 Other hemorrhoids: Secondary | ICD-10-CM | POA: Diagnosis not present

## 2020-08-31 DIAGNOSIS — Z01419 Encounter for gynecological examination (general) (routine) without abnormal findings: Secondary | ICD-10-CM

## 2020-08-31 DIAGNOSIS — M818 Other osteoporosis without current pathological fracture: Secondary | ICD-10-CM

## 2020-08-31 MED ORDER — HYDROCORTISONE (PERIANAL) 2.5 % EX CREA: TOPICAL_CREAM | Freq: Two times a day (BID) | CUTANEOUS | 1 refills | Status: DC

## 2020-08-31 MED ORDER — LIDOCAINE 5 % EX OINT: 1.0000 "application " | TOPICAL_OINTMENT | Freq: Three times a day (TID) | CUTANEOUS | 0 refills | Status: DC

## 2020-09-07 ENCOUNTER — Other Ambulatory Visit: Payer: Self-pay

## 2020-09-07 ENCOUNTER — Encounter: Payer: Self-pay | Admitting: Family Medicine

## 2020-09-07 ENCOUNTER — Telehealth (INDEPENDENT_AMBULATORY_CARE_PROVIDER_SITE_OTHER): Payer: Medicare Other | Admitting: Family Medicine

## 2020-09-07 DIAGNOSIS — I10 Essential (primary) hypertension: Secondary | ICD-10-CM | POA: Diagnosis not present

## 2020-09-07 DIAGNOSIS — F4321 Adjustment disorder with depressed mood: Secondary | ICD-10-CM | POA: Diagnosis not present

## 2020-09-07 NOTE — Patient Instructions (Signed)
See how you feel with this blood pressure  If you become more lightheaded /consistently - cut your olmesartan in 1/2 and take a half pill daily   Take care of yourself  Eat regularly and drink fluids Keep exercising  Continue grief counseling   Let us know if you need anything

## 2020-09-07 NOTE — Assessment & Plan Note (Signed)
Pt recently lost her partner Busy-selling property and getting settled Is going to grief counseling  Good insight/doing well for now  Will let us know if that changes

## 2020-09-07 NOTE — Progress Notes (Signed)
Virtual Visit via Video Note  I connected with [name] on 09/07/20 at 12:00 PM EDT by a video enabled telemedicine application and verified that I am speaking with the correct person using two identifiers.  Location: Patient: home Provider: office   I discussed the limitations of evaluation and management by telemedicine and the availability of in person appointments. The patient expressed understanding and agreed to proceed.  Parties involved in encounter  Patient:Karen Rice  Provider:  Loura Pardon MD    History of Present Illness: Pt presents to discuss HTN  At the eye doctor her bp was 114/70   (usually has white coat)  At home 90s/60s , low 100s/70s in sept  Unusual for her  She occ feels lightheaded - when getting up (not bad)   Weight - lost and then gained back  Eating very well   She exercises every day    Currently takes olmesartan 20 mg daily   Intolerant of lisinopril and amlodipine in the past   Grief -lost her partner (getting therapy)  Patient Active Problem List   Diagnosis Date Noted  . Grief reaction 09/07/2020  . Medicare annual wellness visit, initial 12/02/2018  . Colon cancer screening 12/02/2018  . Colitis 11/02/2018  . Diarrhea 10/29/2018  . Genetic testing 08/26/2018  . Family history of breast cancer   . Family history of uterine cancer   . Welcome to Medicare preventive visit 10/06/2016  . Estrogen deficiency 09/18/2015  . Hypertension 03/03/2011  . Routine general medical examination at a health care facility 03/03/2011  . ALLERGIC RHINITIS 07/21/2007  . Age-related osteoporosis without fracture 07/21/2007   Past Medical History:  Diagnosis Date  . Allergy    allergic rhinitis  . Family history of breast cancer   . Family history of uterine cancer   . Gilbert's syndrome    with high bilirubin  . Hypertension   . Osteoporosis    Past Surgical History:  Procedure Laterality Date  . TONSILLECTOMY AND ADENOIDECTOMY  1957    Social History   Tobacco Use  . Smoking status: Former Smoker    Quit date: 11/10/2000    Years since quitting: 19.8  . Smokeless tobacco: Never Used  Vaping Use  . Vaping Use: Never used  Substance Use Topics  . Alcohol use: Yes    Alcohol/week: 7.0 standard drinks    Types: 7 Glasses of wine per week  . Drug use: No   Family History  Problem Relation Age of Onset  . Heart disease Mother        heart attack  . Hypertension Mother   . Breast cancer Mother        ages 50 and 21  . Hypertension Father   . Heart disease Maternal Grandfather        MI  . Hypertension Sister   . Osteopenia Sister   . Lung cancer Paternal Uncle        heavy smoker  . Heart disease Maternal Aunt   . Dementia Paternal Grandmother   . Uterine cancer Cousin        dx in her 26s  . Uterine cancer Other        MGM's maternal aunt   Allergies  Allergen Reactions  . Boniva [Ibandronic Acid] Nausea Only  . Lisinopril Other (See Comments)    Severe fatigue  . Norvasc [Amlodipine Besylate]     Fatigue/ depression   Current Outpatient Medications on File Prior to Visit  Medication  Sig Dispense Refill  . Ascorbic Acid (VITAMIN C PO) Take by mouth.    . hydrocortisone (ANUSOL-HC) 2.5 % rectal cream Place rectally 2 (two) times daily. 28 g 1  . lidocaine (XYLOCAINE) 5 % ointment Apply 1 application topically 3 (three) times daily. 30 g 0  . Multiple Vitamin (MULTIVITAMIN) capsule Take 1 capsule by mouth daily.     Marland Kitchen olmesartan (BENICAR) 20 MG tablet Take 1 tablet (20 mg total) by mouth daily. 90 tablet 3  . tretinoin (RETIN-A) 0.05 % cream APPLY TO THE FACE DAILY    . VITAMIN D PO Take 3,000 Int'l Units by mouth.     No current facility-administered medications on file prior to visit.     Review of Systems  Constitutional: Negative for chills, fever and malaise/fatigue.  HENT: Negative for congestion, ear pain, sinus pain and sore throat.   Eyes: Negative for blurred vision, discharge and  redness.  Respiratory: Negative for cough, shortness of breath and stridor.   Cardiovascular: Negative for chest pain, palpitations and leg swelling.  Gastrointestinal: Negative for abdominal pain, diarrhea, nausea and vomiting.  Musculoskeletal: Negative for myalgias.  Skin: Negative for rash.  Neurological: Negative for dizziness and headaches.       Occ light headed when changing position    Observations/Objective: Patient appears well, in no distress Weight is baseline  No facial swelling or asymmetry Normal voice-not hoarse and no slurred speech No obvious tremor or mobility impairment Moving neck and UEs normally Able to hear the call well  No cough or shortness of breath during interview  Talkative and mentally sharp with no cognitive changes No skin changes on face or neck , no rash or pallor Affect is normal    Assessment and Plan: Problem List Items Addressed This Visit      Cardiovascular and Mediastinum   Hypertension - Primary    bp trending down in setting of adjustment (lost her partner recently)  Diet -getting back to normal  occ lightheaded with pos change /no palpitations  Suggested continuing to monitor and can cut olmasartan in 1/2 to 10 mg if needed  She will let us know  Enc to keep up good self care         Other   Grief reaction    Pt recently lost her partner Busy-selling property and getting settled Is going to grief counseling  Good insight/doing well for now  Will let us know if that changes           Follow Up Instructions: See how you feel with this blood pressure  If you become more lightheaded /consistently - cut your olmesartan in 1/2 and take a half pill daily   Take care of yourself  Eat regularly and drink fluids Keep exercising  Continue grief counseling   Let us know if you need anything    I discussed the assessment and treatment plan with the patient. The patient was provided an opportunity to ask questions and all  were answered. The patient agreed with the plan and demonstrated an understanding of the instructions.   The patient was advised to call back or seek an in-person evaluation if the symptoms worsen or if the condition fails to improve as anticipated.     Loura Pardon, MD

## 2020-09-07 NOTE — Assessment & Plan Note (Signed)
bp trending down in setting of adjustment (lost her partner recently)  Diet -getting back to normal  occ lightheaded with pos change /no palpitations  Suggested continuing to monitor and can cut olmasartan in 1/2 to 10 mg if needed  She will let us know  Enc to keep up good self care

## 2020-11-19 ENCOUNTER — Encounter: Payer: Self-pay | Admitting: Family Medicine

## 2020-11-19 ENCOUNTER — Other Ambulatory Visit: Payer: Self-pay

## 2020-11-19 ENCOUNTER — Telehealth (INDEPENDENT_AMBULATORY_CARE_PROVIDER_SITE_OTHER): Payer: Medicare Other | Admitting: Family Medicine

## 2020-11-19 DIAGNOSIS — J0100 Acute maxillary sinusitis, unspecified: Secondary | ICD-10-CM | POA: Diagnosis not present

## 2020-11-19 DIAGNOSIS — J019 Acute sinusitis, unspecified: Secondary | ICD-10-CM | POA: Insufficient documentation

## 2020-11-19 MED ORDER — AMOXICILLIN-POT CLAVULANATE 875-125 MG PO TABS: 1.0000 | ORAL_TABLET | Freq: Two times a day (BID) | ORAL | 0 refills | Status: DC

## 2020-11-19 NOTE — Patient Instructions (Signed)
Drink fluids and rest  Continue nasal saline/netti pot and vaporizer  Take augmentin as directed Update if not starting to improve in a week or if worsening

## 2020-11-19 NOTE — Progress Notes (Addendum)
Virtual Visit via Video Note  I connected with Karen Rice on 11/19/20 at  8:00 AM EST by a video enabled telemedicine application and verified that I am speaking with the correct person using two identifiers.  Location: Patient: home Provider: office   I discussed the limitations of evaluation and management by telemedicine and the availability of in person appointments. The patient expressed understanding and agreed to proceed.  Parties involved in encounter  Patient: Karen Rice  Provider:  Loura Pardon MD   Video failed today on the part of the patient so it was conducted by phone  History of Present Illness: Pt presents with sinus symptoms and hoarse voice   Started end of dec with nasal congestion  Now having some green/yellow mucous /blood from nose in am  Most of congestion is at night  Some facial pain on the L side (a little improved)  Hoarse voice  Nose runs during day - clear  No cough at all   Tested at home for covid 3 times/ neg    No fever No ST No ear pain   No n/v/d   otc: netti pot  1/2 of allegra  Vaporizer in bedroom    imm status  Ad flu shot  [facility] covid imm in [place] and April   Patient Active Problem List   Diagnosis Date Noted  . Grief reaction 09/07/2020  . Medicare annual wellness visit, initial 12/02/2018  . Colon cancer screening 12/02/2018  . Colitis 11/02/2018  . Diarrhea 10/29/2018  . Genetic testing 08/26/2018  . Family history of breast cancer   . Family history of uterine cancer   . Welcome to Medicare preventive visit 10/06/2016  . Estrogen deficiency 09/18/2015  . Hypertension 03/03/2011  . Routine general medical examination at a health care facility 03/03/2011  . ALLERGIC RHINITIS 07/21/2007  . Age-related osteoporosis without fracture 07/21/2007   Past Medical History:  Diagnosis Date  . Allergy    allergic rhinitis  . Family history of breast cancer   . Family history of uterine cancer   . Gilbert's syndrome     with high bilirubin  . Hypertension   . Osteoporosis    Past Surgical History:  Procedure Laterality Date  . TONSILLECTOMY AND ADENOIDECTOMY  1957   Social History   Tobacco Use  . Smoking status: Former Smoker    Quit date: 11/10/2000    Years since quitting: 20.0  . Smokeless tobacco: Never Used  Vaping Use  . Vaping Use: Never used  Substance Use Topics  . Alcohol use: Yes    Alcohol/week: 7.0 standard drinks    Types: 7 Glasses of wine per week  . Drug use: No   Family History  Problem Relation Age of Onset  . Heart disease Mother        heart attack  . Hypertension Mother   . Breast cancer Mother        ages 69 and 71  . Hypertension Father   . Heart disease Maternal Grandfather        MI  . Hypertension Sister   . Osteopenia Sister   . Lung cancer Paternal Uncle        heavy smoker  . Heart disease Maternal Aunt   . Dementia Paternal Grandmother   . Uterine cancer Cousin        dx in her 47s  . Uterine cancer Other        MGM's maternal aunt   Allergies  Allergen Reactions  . Boniva [Ibandronic Acid] Nausea Only  . Lisinopril Other (See Comments)    Severe fatigue  . Norvasc [Amlodipine Besylate]     Fatigue/ depression   Current Outpatient Medications on File Prior to Visit  Medication Sig Dispense Refill  . Ascorbic Acid (VITAMIN C PO) Take by mouth.    . hydrocortisone (ANUSOL-HC) 2.5 % rectal cream Place rectally 2 (two) times daily. 28 g 1  . Multiple Vitamin (MULTIVITAMIN) capsule Take 1 capsule by mouth daily.    Marland Kitchen olmesartan (BENICAR) 20 MG tablet Take 1 tablet (20 mg total) by mouth daily. (Patient taking differently: Take 10 mg by mouth daily.) 90 tablet 3  . VITAMIN D PO Take 2,000 Units by mouth.    . tretinoin (RETIN-A) 0.05 % cream APPLY TO THE FACE DAILY (Patient not taking: Reported on 11/19/2020)     No current facility-administered medications on file prior to visit.   Review of Systems  Constitutional: Negative for chills, fever and  malaise/fatigue.  HENT: Positive for congestion and sinus pain. Negative for ear pain and sore throat.   Eyes: Negative for blurred vision, discharge and redness.  Respiratory: Negative for cough, shortness of breath and stridor.   Cardiovascular: Negative for chest pain, palpitations and leg swelling.  Gastrointestinal: Negative for abdominal pain, diarrhea, nausea and vomiting.  Musculoskeletal: Negative for myalgias.  Skin: Negative for rash.  Neurological: Negative for dizziness and headaches.    Observations/Objective: Pt sounds well  Hoarse voice /nasal  No cough or wheeze audible  Nl cognition-good historian Nl mood   Assessment and Plan: Problem List Items Addressed This Visit      Respiratory   Acute sinusitis    Congestion/purulent nasal drainage and L sided facial pain that started 2-3 wk ago  Tested neg for covid 3 times Immunized for covid and flu Enc fluids and rest  Continue vaporizer/netti pot vaporizer  Px augmentin  Update if not starting to improve in a week or if worsening        Relevant Medications   amoxicillin-clavulanate (AUGMENTIN) 875-125 MG tablet       Follow Up Instructions: Drink fluids and rest  Continue nasal saline/netti pot and vaporizer  Take augmentin as directed Update if not starting to improve in a week or if worsening     I discussed the assessment and treatment plan with the patient. The patient was provided an opportunity to ask questions and all were answered. The patient agreed with the plan and demonstrated an understanding of the instructions.   The patient was advised to call back or seek an in-person evaluation if the symptoms worsen or if the condition fails to improve as anticipated.  I provided 16 minutes of non-face-to-face time during this encounter.  Loura Pardon, MD

## 2020-11-19 NOTE — Assessment & Plan Note (Signed)
Congestion/purulent nasal drainage and L sided facial pain that started 2-3 wk ago  Tested neg for covid 3 times Immunized for covid and flu Enc fluids and rest  Continue vaporizer/netti pot vaporizer  Px augmentin  Update if not starting to improve in a week or if worsening

## 2021-01-30 ENCOUNTER — Telehealth: Payer: Self-pay | Admitting: Family Medicine

## 2021-01-30 DIAGNOSIS — M818 Other osteoporosis without current pathological fracture: Secondary | ICD-10-CM

## 2021-01-30 DIAGNOSIS — I10 Essential (primary) hypertension: Secondary | ICD-10-CM

## 2021-01-30 NOTE — Telephone Encounter (Signed)
-----   Message from Ellamae Sia sent at 01/14/2021  3:05 PM EST ----- Regarding: Lab orders for Thursday, 3.24.22 Patient is scheduled for CPX labs, please order future labs, Thanks , Karna Christmas

## 2021-01-31 ENCOUNTER — Other Ambulatory Visit (INDEPENDENT_AMBULATORY_CARE_PROVIDER_SITE_OTHER): Payer: Medicare Other

## 2021-01-31 ENCOUNTER — Other Ambulatory Visit: Payer: Self-pay

## 2021-01-31 DIAGNOSIS — I10 Essential (primary) hypertension: Secondary | ICD-10-CM

## 2021-01-31 DIAGNOSIS — M818 Other osteoporosis without current pathological fracture: Secondary | ICD-10-CM

## 2021-01-31 LAB — LIPID PANEL
Cholesterol: 200 mg/dL (ref 0–200)
HDL: 92.7 mg/dL (ref >39.00)
LDL Cholesterol: 96 mg/dL (ref 0–99)
NonHDL: 107.79
Total CHOL/HDL Ratio: 2
Triglycerides: 57 mg/dL (ref 0.0–149.0)
VLDL: 11.4 mg/dL (ref 0.0–40.0)

## 2021-01-31 LAB — COMPREHENSIVE METABOLIC PANEL
ALT: 15 U/L (ref 0–35)
AST: 21 U/L (ref 0–37)
Albumin: 4.9 g/dL (ref 3.5–5.2)
Alkaline Phosphatase: 64 U/L (ref 39–117)
BUN: 15 mg/dL (ref 6–23)
CO2: 28 mEq/L (ref 19–32)
Calcium: 10.2 mg/dL (ref 8.4–10.5)
Chloride: 100 mEq/L (ref 96–112)
Creatinine, Ser: 0.75 mg/dL (ref 0.40–1.20)
GFR: 81.18 mL/min (ref >60.00)
Glucose, Bld: 98 mg/dL (ref 70–99)
Potassium: 4.5 mEq/L (ref 3.5–5.1)
Sodium: 137 mEq/L (ref 135–145)
Total Bilirubin: 1.5 mg/dL — ABNORMAL HIGH (ref 0.2–1.2)
Total Protein: 7 g/dL (ref 6.0–8.3)

## 2021-01-31 LAB — CBC WITH DIFFERENTIAL/PLATELET
Basophils Absolute: 0.1 10*3/uL (ref 0.0–0.1)
Basophils Relative: 0.9 % (ref 0.0–3.0)
Eosinophils Absolute: 0.3 10*3/uL (ref 0.0–0.7)
Eosinophils Relative: 3.9 % (ref 0.0–5.0)
HCT: 39.9 % (ref 36.0–46.0)
Hemoglobin: 13.6 g/dL (ref 12.0–15.0)
Lymphocytes Relative: 25.6 % (ref 12.0–46.0)
Lymphs Abs: 1.7 10*3/uL (ref 0.7–4.0)
MCHC: 34.2 g/dL (ref 30.0–36.0)
MCV: 92.5 fl (ref 78.0–100.0)
Monocytes Absolute: 0.6 10*3/uL (ref 0.1–1.0)
Monocytes Relative: 8.4 % (ref 3.0–12.0)
Neutro Abs: 4.1 10*3/uL (ref 1.4–7.7)
Neutrophils Relative %: 61.2 % (ref 43.0–77.0)
Platelets: 269 10*3/uL (ref 150.0–400.0)
RBC: 4.32 Mil/uL (ref 3.87–5.11)
RDW: 13.5 % (ref 11.5–15.5)
WBC: 6.6 10*3/uL (ref 4.0–10.5)

## 2021-01-31 LAB — TSH: TSH: 2.53 u[IU]/mL (ref 0.35–4.50)

## 2021-01-31 LAB — VITAMIN D 25 HYDROXY (VIT D DEFICIENCY, FRACTURES): VITD: 51.82 ng/mL (ref 30.00–100.00)

## 2021-02-01 ENCOUNTER — Ambulatory Visit (INDEPENDENT_AMBULATORY_CARE_PROVIDER_SITE_OTHER): Payer: Medicare Other

## 2021-02-01 DIAGNOSIS — Z0000 Encounter for general adult medical examination without abnormal findings: Secondary | ICD-10-CM

## 2021-02-01 NOTE — Patient Instructions (Signed)
Karen Rice , Thank you for taking time to come for your Medicare Wellness Visit. I appreciate your ongoing commitment to your health goals. Please review the following plan we discussed and let me know if I can assist you in the future.   Screening recommendations/referrals: Colonoscopy: Up to date, completed 07/14/2019, due 07/2029 Mammogram: Up to date, completed 08/30/2020, due 08/2021 Bone Density: due, will discuss with provider  Recommended yearly ophthalmology/optometry visit for glaucoma screening and checkup Recommended yearly dental visit for hygiene and checkup  Vaccinations: Influenza vaccine: Up to date, completed 08/16/2020, due 06/2021 Pneumococcal vaccine: Completed series Tdap vaccine: Up to date, completed 09/15/2013, due 09/2023 Shingles vaccine: due, check with your insurance regarding coverage if interested    Covid-19:Completed series  Advanced directives: Please bring a copy of your POA (Power of [place]) and/or Living Will to your next appointment.    Conditions/risks identified: hypertension  Next appointment: Follow up in one year for your annual wellness visit    Preventive Care 70 Years and Older, Female Preventive care refers to lifestyle choices and visits with your health care provider that can promote health and wellness. What does preventive care include?  A yearly physical exam. This is also called an annual well check.  Dental exams once or twice a year.  Routine eye exams. Ask your health care provider how often you should have your eyes checked.  Personal lifestyle choices, including:  Daily care of your teeth and gums.  Regular physical activity.  Eating a healthy diet.  Avoiding tobacco and drug use.  Limiting alcohol use.  Practicing safe sex.  Taking low-dose aspirin every day.  Taking vitamin and mineral supplements as recommended by your health care provider. What happens during an annual well check? The services and screenings  done by your health care provider during your annual well check will depend on your age, overall health, lifestyle risk factors, and family history of disease. Counseling  Your health care provider Gentzler ask you questions about your:  Alcohol use.  Tobacco use.  Drug use.  Emotional well-being.  Home and relationship well-being.  Sexual activity.  Eating habits.  History of falls.  Memory and ability to understand (cognition).  Work and work Statistician.  Reproductive health. Screening  You Byer have the following tests or measurements:  Height, weight, and BMI.  Blood pressure.  Lipid and cholesterol levels. These Cantrall be checked every 5 years, or more frequently if you are over 70 years old.  Skin check.  Lung cancer screening. You Swader have this screening every year starting at age 70 if you have a 30-pack-year history of smoking and currently smoke or have quit within the past 15 years.  Fecal occult blood test (FOBT) of the stool. You Soileau have this test every year starting at age 70.  Flexible sigmoidoscopy or colonoscopy. You Kichline have a sigmoidoscopy every 5 years or a colonoscopy every 10 years starting at age 70.  Hepatitis C blood test.  Hepatitis B blood test.  Sexually transmitted disease (STD) testing.  Diabetes screening. This is done by checking your blood sugar (glucose) after you have not eaten for a while (fasting). You Tancredi have this done every 1-3 years.  Bone density scan. This is done to screen for osteoporosis. You Rewis have this done starting at age 70.  Mammogram. This Gural be done every 1-2 years. Talk to your health care provider about how often you should have regular mammograms. Talk with your health care provider  about your test results, treatment options, and if necessary, the need for more tests. Vaccines  Your health care provider Varkey recommend certain vaccines, such as:  Influenza vaccine. This is recommended every year.  Tetanus,  diphtheria, and acellular pertussis (Tdap, Td) vaccine. You Boggess need a Td booster every 10 years.  Zoster vaccine. You Grandpre need this after age 70.  Pneumococcal 13-valent conjugate (PCV13) vaccine. One dose is recommended after age 70.  Pneumococcal polysaccharide (PPSV23) vaccine. One dose is recommended after age 70. Talk to your health care provider about which screenings and vaccines you need and how often you need them. This information is not intended to replace advice given to you by your health care provider. Make sure you discuss any questions you have with your health care provider. Document Released: 11/23/2015 Document Revised: 07/16/2016 Document Reviewed: 08/28/2015 Elsevier Interactive Patient Education  2017 [place] Prevention in the Home Falls can cause injuries. They can happen to people of all ages. There are many things you can do to make your home safe and to help prevent falls. What can I do on the outside of my home?  Regularly fix the edges of walkways and driveways and fix any cracks.  Remove anything that might make you trip as you walk through a door, such as a raised step or threshold.  Trim any bushes or trees on the path to your home.  Use bright outdoor lighting.  Clear any walking paths of anything that might make someone trip, such as rocks or tools.  Regularly check to see if handrails are loose or broken. Make sure that both sides of any steps have handrails.  Any raised decks and porches should have guardrails on the edges.  Have any leaves, snow, or ice cleared regularly.  Use sand or salt on walking paths during winter.  Clean up any spills in your garage right away. This includes oil or grease spills. What can I do in the bathroom?  Use night lights.  Install grab bars by the toilet and in the tub and shower. Do not use towel bars as grab bars.  Use non-skid mats or decals in the tub or shower.  If you need to sit down in  the shower, use a plastic, non-slip stool.  Keep the floor dry. Clean up any water that spills on the floor as soon as it happens.  Remove soap buildup in the tub or shower regularly.  Attach bath mats securely with double-sided non-slip rug tape.  Do not have throw rugs and other things on the floor that can make you trip. What can I do in the bedroom?  Use night lights.  Make sure that you have a light by your bed that is easy to reach.  Do not use any sheets or blankets that are too big for your bed. They should not hang down onto the floor.  Have a firm chair that has side arms. You can use this for support while you get dressed.  Do not have throw rugs and other things on the floor that can make you trip. What can I do in the kitchen?  Clean up any spills right away.  Avoid walking on wet floors.  Keep items that you use a lot in easy-to-reach places.  If you need to reach something above you, use a strong step stool that has a grab bar.  Keep electrical cords out of the way.  Do not use floor polish or  wax that makes floors slippery. If you must use wax, use non-skid floor wax.  Do not have throw rugs and other things on the floor that can make you trip. What can I do with my stairs?  Do not leave any items on the stairs.  Make sure that there are handrails on both sides of the stairs and use them. Fix handrails that are broken or loose. Make sure that handrails are as long as the stairways.  Check any carpeting to make sure that it is firmly attached to the stairs. Fix any carpet that is loose or worn.  Avoid having throw rugs at the top or bottom of the stairs. If you do have throw rugs, attach them to the floor with carpet tape.  Make sure that you have a light switch at the top of the stairs and the bottom of the stairs. If you do not have them, ask someone to add them for you. What else can I do to help prevent falls?  Wear shoes that:  Do not have high  heels.  Have rubber bottoms.  Are comfortable and fit you well.  Are closed at the toe. Do not wear sandals.  If you use a stepladder:  Make sure that it is fully opened. Do not climb a closed stepladder.  Make sure that both sides of the stepladder are locked into place.  Ask someone to hold it for you, if possible.  Clearly mark and make sure that you can see:  Any grab bars or handrails.  First and last steps.  Where the edge of each step is.  Use tools that help you move around (mobility aids) if they are needed. These include:  Canes.  Walkers.  Scooters.  Crutches.  Turn on the lights when you go into a dark area. Replace any light bulbs as soon as they burn out.  Set up your furniture so you have a clear path. Avoid moving your furniture around.  If any of your floors are uneven, fix them.  If there are any pets around you, be aware of where they are.  Review your medicines with your doctor. Some medicines can make you feel dizzy. This can increase your chance of falling. Ask your doctor what other things that you can do to help prevent falls. This information is not intended to replace advice given to you by your health care provider. Make sure you discuss any questions you have with your health care provider. Document Released: 08/23/2009 Document Revised: 04/03/2016 Document Reviewed: 12/01/2014 Elsevier Interactive Patient Education  2017 [employer].

## 2021-02-01 NOTE — Progress Notes (Signed)
Subjective:   Karen Rice is a 70 y.o. female who presents for Medicare Annual (Subsequent) preventive examination.  Review of Systems: N/A      I connected with the patient today by telephone and verified that I am speaking with the correct person using two identifiers. Location patient: home Location nurse: work Persons participating in the telephone visit: patient, nurse.   I discussed the limitations, risks, security and privacy concerns of performing an evaluation and management service by telephone and the availability of in person appointments. I also discussed with the patient that there Mottola be a patient responsible charge related to this service. The patient expressed understanding and verbally consented to this telephonic visit.        Cardiac Risk Factors include: advanced age (>96men, >28 women);hypertension     Objective:    Today's Vitals   There is no height or weight on file to calculate BMI.  Advanced Directives 02/01/2021 01/31/2020 10/29/2018  Does Patient Have a Medical Advance Directive? Yes Yes No  Type of Paramedic of [place];Living will [place];Living will -  Copy of [place] in Chart? No - copy requested No - copy requested -  Would patient like information on creating a medical advance directive? - - No - Patient declined    Current Medications (verified) Outpatient Encounter Medications as of 02/01/2021  Medication Sig  . Ascorbic Acid (VITAMIN C PO) Take by mouth.  . hydrocortisone (ANUSOL-HC) 2.5 % rectal cream Place rectally 2 (two) times daily.  . Multiple Vitamin (MULTIVITAMIN) capsule Take 1 capsule by mouth daily.  Marland Kitchen olmesartan (BENICAR) 20 MG tablet Take 1 tablet (20 mg total) by mouth daily. (Patient taking differently: Take 10 mg by mouth daily.)  . tretinoin (RETIN-A) 0.05 % cream   . VITAMIN D PO Take 2,000 Units by mouth.  Marland Kitchen amoxicillin-clavulanate (AUGMENTIN) 875-125 MG  tablet Take 1 tablet by mouth 2 (two) times daily.   No facility-administered encounter medications on file as of 02/01/2021.    Allergies (verified) Boniva [ibandronic acid], Lisinopril, and Norvasc [amlodipine besylate]   History: Past Medical History:  Diagnosis Date  . Allergy    allergic rhinitis  . Family history of breast cancer   . Family history of uterine cancer   . Gilbert's syndrome    with high bilirubin  . Hypertension   . Osteoporosis    Past Surgical History:  Procedure Laterality Date  . TONSILLECTOMY AND ADENOIDECTOMY  1957   Family History  Problem Relation Age of Onset  . Heart disease Mother        heart attack  . Hypertension Mother   . Breast cancer Mother        ages 25 and 69  . Hypertension Father   . Heart disease Maternal Grandfather        MI  . Hypertension Sister   . Osteopenia Sister   . Lung cancer Paternal Uncle        heavy smoker  . Heart disease Maternal Aunt   . Dementia Paternal Grandmother   . Uterine cancer Cousin        dx in her 62s  . Uterine cancer Other        MGM's maternal aunt   Social History   Socioeconomic History  . Marital status: Divorced    Spouse name: Not on file  . Number of children: Not on file  . Years of education: Not on file  .  Highest education level: Not on file  Occupational History  . Not on file  Tobacco Use  . Smoking status: Former Smoker    Quit date: 11/10/2000    Years since quitting: 20.2  . Smokeless tobacco: Never Used  Vaping Use  . Vaping Use: Never used  Substance and Sexual Activity  . Alcohol use: Yes    Alcohol/week: 4.0 standard drinks    Types: 4 Glasses of wine per week  . Drug use: No  . Sexual activity: Not Currently    Partners: Female    Birth control/protection: Post-menopausal  Other Topics Concern  . Not on file  Social History Narrative  . Not on file   Social Determinants of Health   Financial Resource Strain: Low Risk   . Difficulty of Paying  Living Expenses: Not hard at all  Food Insecurity: No Food Insecurity  . Worried About Charity fundraiser in the Last Year: Never true  . Ran Out of Food in the Last Year: Never true  Transportation Needs: No Transportation Needs  . Lack of Transportation (Medical): No  . Lack of Transportation (Non-Medical): No  Physical Activity: Sufficiently Active  . Days of Exercise per Week: 7 days  . Minutes of Exercise per Session: 60 min  Stress: No Stress Concern Present  . Feeling of Stress : Not at all  Social Connections: Not on file    Tobacco Counseling Counseling given: Not Answered   Clinical Intake:  Pre-visit preparation completed: Yes  Pain : No/denies pain     Nutritional Risks: None Diabetes: No  How often do you need to have someone help you when you read instructions, pamphlets, or other written materials from your doctor or pharmacy?: 1 - Never What is the last grade level you completed in school?: Masters  Diabetic: No Nutrition Risk Assessment:  Has the patient had any N/V/D within the last 2 months?  No  Does the patient have any non-healing wounds?  No  Has the patient had any unintentional weight loss or weight gain?  No   Diabetes:  Is the patient diabetic?  No  If diabetic, was a CBG obtained today?  N/A  Did the patient bring in their glucometer from home?  N/A  How often do you monitor your CBG's? N/A.   Financial Strains and Diabetes Management:  Are you having any financial strains with the device, your supplies or your medication? N/A.  Does the patient want to be seen by Chronic Care Management for management of their diabetes?  N/A Would the patient like to be referred to a Nutritionist or for Diabetic Management?  N/A     Interpreter Needed?: No  Information entered by :: CJohnson, LPN   Activities of Daily Living In your present state of health, do you have any difficulty performing the following activities: 02/01/2021  Hearing? N   Vision? N  Difficulty concentrating or making decisions? N  Walking or climbing stairs? N  Dressing or bathing? N  Doing errands, shopping? N  Preparing Food and eating ? N  Using the Toilet? N  In the past six months, have you accidently leaked urine? N  Do you have problems with loss of bowel control? N  Managing your Medications? N  Managing your Finances? N  Housekeeping or managing your Housekeeping? N  Some recent data might be hidden    Patient Care Team: Tower, Wynelle Fanny, MD as PCP - General  Indicate any recent Medical Services you  Behrens have received from other than [facility] providers in the past year (date Sommers be approximate).     Assessment:   This is a routine wellness examination for Khalise.  Hearing/Vision screen  Hearing Screening   125Hz  250Hz  500Hz  1000Hz  2000Hz  3000Hz  4000Hz  6000Hz  8000Hz   Right ear:           Left ear:           Vision Screening Comments: Patient gets annual eye exams  Dietary issues and exercise activities discussed: Current Exercise Habits: Home exercise routine, Type of exercise: walking, Time (Minutes): 60, Frequency (Times/Week): 7, Weekly Exercise (Minutes/Week): 420, Intensity: Moderate, Exercise limited by: None identified  Goals    . Patient Stated     01/31/2020, I will continue to walk my dogs everyday for 2 1/2 miles.     . Patient Stated     02/01/2021, I will continue to walk 2-4 miles every day.       Depression Screen PHQ 2/9 Scores 02/01/2021 01/31/2020 12/02/2018 10/09/2017 10/06/2016  PHQ - 2 Score 0 0 0 0 0  PHQ- 9 Score 0 0 - - -    Fall Risk Fall Risk  02/01/2021 01/31/2020 12/02/2018 09/24/2018 10/09/2017  Falls in the past year? 0 0 0 0 Yes  Comment - - - Emmi Telephone Survey: data to providers prior to load -  Number falls in past yr: 0 0 0 - 1  Injury with Fall? 0 0 - - No  Risk for fall due to : Medication side effect Medication side effect - - -  Follow up Falls evaluation completed;Falls prevention discussed  Falls prevention discussed;Falls evaluation completed - - Falls evaluation completed    FALL RISK PREVENTION PERTAINING TO THE HOME:  Any stairs in or around the home? Yes  If so, are there any without handrails? No  Home free of loose throw rugs in walkways, pet beds, electrical cords, etc? Yes  Adequate lighting in your home to reduce risk of falls? Yes   ASSISTIVE DEVICES UTILIZED TO PREVENT FALLS:  Life alert? No  Use of a cane, walker or w/c? No  Grab bars in the bathroom? No  Shower chair or bench in shower? No  Elevated toilet seat or a handicapped toilet? No   TIMED UP AND GO:  Was the test performed? N/A telephone visit .    Cognitive Function: MMSE - Mini Mental State Exam 02/01/2021 01/31/2020  Orientation to time 5 5  Orientation to Place 5 5  Registration 3 3  Attention/ Calculation 5 5  Recall 3 3  Language- repeat 1 1  Mini Cog  Mini-Cog screen was completed. Maximum score is 22. A value of 0 denotes this part of the MMSE was not completed or the patient failed this part of the Mini-Cog screening.       Immunizations Immunization History  Administered Date(s) Administered  . Fluad Quad(high Dose 65+) 08/16/2020  . Hep A / Hep B 12/06/2004, 01/10/2005, 06/13/2005  . Influenza Split 10/20/2011, 07/26/2012  . Influenza Whole 08/14/2010  . Influenza, High Dose Seasonal PF 08/26/2017, 09/07/2018  . Influenza,inj,Quad PF,6+ Mos 08/31/2013, 09/08/2014, 09/18/2015, 10/06/2016, 07/26/2019  . Moderna Sars-Covid-2 Vaccination 01/02/2020, 02/14/2020, 09/29/2020  . Pneumococcal Conjugate-13 11/12/2016  . Pneumococcal Polysaccharide-23 09/15/2019  . Td 11/11/1999, 07/18/2010  . Tdap 09/15/2013  . Zoster 03/23/2013    TDAP status: Up to date  Flu Vaccine status: Up to date  Pneumococcal vaccine status: Up to date  Covid-19 vaccine status:  Completed vaccines  Qualifies for Shingles Vaccine? Yes   Zostavax completed Yes   Shingrix Completed?: No.     Education has been provided regarding the importance of this vaccine. Patient has been advised to call insurance company to determine out of pocket expense if they have not yet received this vaccine. Advised Klingel also receive vaccine at local pharmacy or Health Dept. Verbalized acceptance and understanding.  Screening Tests Health Maintenance  Topic Date Due  . MAMMOGRAM  08/30/2021  . TETANUS/TDAP  09/16/2023  . COLONOSCOPY (Pts 45-5yrs Insurance coverage will need to be confirmed)  07/13/2029  . INFLUENZA VACCINE  Completed  . DEXA SCAN  Completed  . COVID-19 Vaccine  Completed  . Hepatitis C Screening  Completed  . PNA vac Low Risk Adult  Completed  . HPV VACCINES  Aged Out    Health Maintenance  There are no preventive care reminders to display for this patient.  Colorectal cancer screening: Type of screening: Colonoscopy. Completed 07/14/2019. Repeat every 10 years  Mammogram status: Completed 08/30/2020. Repeat every year  Bone Density status: due, will discuss with provider  Lung Cancer Screening: (Low Dose CT Chest recommended if Age 57-80 years, 30 pack-year currently smoking OR have quit w/in 15 years.) does not qualify.  Additional Screening:  Hepatitis C Screening: does qualify; Completed 07/01/2016  Vision Screening: Recommended annual ophthalmology exams for early detection of glaucoma and other disorders of the eye. Is the patient up to date with their annual eye exam?  Yes  Who is the provider or what is the name of the office in which the patient attends annual eye exams? Dr. Kathrin Penner If pt is not established with a provider, would they like to be referred to a provider to establish care? No .   Dental Screening: Recommended annual dental exams for proper oral hygiene  Community Resource Referral / Chronic Care Management: CRR required this visit?  No   CCM required this visit?  No      Plan:     I have personally reviewed and noted the following in  the patient's chart:   . Medical and social history . Use of alcohol, tobacco or illicit drugs  . Current medications and supplements . Functional ability and status . Nutritional status . Physical activity . Advanced directives . List of other physicians . Hospitalizations, surgeries, and ER visits in previous 12 months . Vitals . Screenings to include cognitive, depression, and falls . Referrals and appointments  In addition, I have reviewed and discussed with patient certain preventive protocols, quality metrics, and best practice recommendations. A written personalized care plan for preventive services as well as general preventive health recommendations were provided to patient.   Due to this being a telephonic visit, the after visit summary with patients personalized plan was offered to patient via office or my-chart. Patient preferred to pick up at office at next visit or via mychart.   Andrez Grime, LPN   02/10/4741

## 2021-02-01 NOTE — Progress Notes (Signed)
PCP notes:  Health Maintenance: Dexa- due   Abnormal Screenings: none   Patient concerns: none   Nurse concerns: none   Next PCP appt.: 02/19/2021 @ 9 am

## 2021-02-05 ENCOUNTER — Ambulatory Visit: Payer: Medicare Other | Admitting: Family Medicine

## 2021-02-19 ENCOUNTER — Other Ambulatory Visit: Payer: Self-pay

## 2021-02-19 ENCOUNTER — Ambulatory Visit (INDEPENDENT_AMBULATORY_CARE_PROVIDER_SITE_OTHER): Payer: Medicare Other | Admitting: Family Medicine

## 2021-02-19 ENCOUNTER — Encounter: Payer: Self-pay | Admitting: Family Medicine

## 2021-02-19 VITALS — BP 132/85 | HR 76 | Temp 96.9°F | Ht 63.0 in | Wt 114.4 lb

## 2021-02-19 DIAGNOSIS — F4320 Adjustment disorder, unspecified: Secondary | ICD-10-CM

## 2021-02-19 DIAGNOSIS — I10 Essential (primary) hypertension: Secondary | ICD-10-CM | POA: Diagnosis not present

## 2021-02-19 DIAGNOSIS — E2839 Other primary ovarian failure: Secondary | ICD-10-CM | POA: Diagnosis not present

## 2021-02-19 DIAGNOSIS — Z8241 Family history of sudden cardiac death: Secondary | ICD-10-CM | POA: Insufficient documentation

## 2021-02-19 DIAGNOSIS — F4321 Adjustment disorder with depressed mood: Secondary | ICD-10-CM | POA: Diagnosis not present

## 2021-02-19 DIAGNOSIS — M818 Other osteoporosis without current pathological fracture: Secondary | ICD-10-CM | POA: Diagnosis not present

## 2021-02-19 NOTE — Assessment & Plan Note (Signed)
Doing better- in counseling after loss of her partner

## 2021-02-19 NOTE — Progress Notes (Signed)
Subjective:    Patient ID: Karen Rice, female    DOB: 01/31/1951, 70 y.o.   MRN: [id number]  This visit occurred during the SARS-CoV-2 public health emergency.  Safety protocols were in place, including screening questions prior to the visit, additional usage of staff PPE, and extensive cleaning of exam room while observing appropriate contact time as indicated for disinfecting solutions.    HPI Here for health maintenance exam and to review chronic medical problems   Had amw last month   Wt Readings from Last 3 Encounters:  02/19/21 114 lb 6 oz (51.9 kg)  11/19/20 112 lb (50.8 kg)  08/31/20 114 lb (51.7 kg)   20.26 kg/m   Doing ok overall  Learning to live without her partner  Is doing some grief counseling/spiritual work and acupuncture  zostavax 5/14-interested in shingrix  covid immunized -considering 2nd booster   Mammogram 10/21 Self breast exam -no lumps   Colonoscopy 9/20- utd  dexa 3/17   OP  [place]one on [place]/ no injury Fractures-none  Supplements - D Level is 51.8  Exercise walks daily 2-4 miles  Push ups and squats    HTN bp is stable today  No cp or palpitations or headaches or edema  No side effects to medicines  BP Readings from Last 3 Encounters:  02/19/21 132/85  11/19/20 107/63  08/31/20 114/70    benicar 10 mg daily  Very well controlled at home often one teens/60s   Family hx of sudden cardiac death  Mother-75 GF -42  Both were smokers   Cholesterol Lab Results  Component Value Date   CHOL 200 01/31/2021   CHOL 191 01/27/2020   CHOL 192 10/19/2018   Lab Results  Component Value Date   HDL 92.70 01/31/2021   HDL 95.00 01/27/2020   HDL 101.60 10/19/2018   Lab Results  Component Value Date   LDLCALC 96 01/31/2021   LDLCALC 86 01/27/2020   [place] 80 10/19/2018   Lab Results  Component Value Date   TRIG 57.0 01/31/2021   TRIG 50.0 01/27/2020   TRIG 51.0 10/19/2018   Lab Results  Component Value Date    CHOLHDL 2 01/31/2021   CHOLHDL 2 01/27/2020   CHOLHDL 2 10/19/2018   Lab Results  Component Value Date   LDLDIRECT 94.6 07/19/2012   LDLDIRECT 89.7 12/26/2008  low risk ratio Very high HDL   Other labs Results for orders placed or performed in visit on 01/31/21  VITAMIN D 25 Hydroxy (Vit-D Deficiency, Fractures)  Result Value Ref Range   VITD 51.82 30.00 - 100.00 ng/mL  TSH  Result Value Ref Range   TSH 2.53 0.35 - 4.50 uIU/mL  Lipid panel  Result Value Ref Range   Cholesterol 200 0 - 200 mg/dL   Triglycerides 57.0 0.0 - 149.0 mg/dL   HDL 92.70 >39.00 mg/dL   VLDL 11.4 0.0 - 40.0 mg/dL   LDL Cholesterol 96 0 - 99 mg/dL   Total CHOL/HDL Ratio 2    NonHDL 107.79   Comprehensive metabolic panel  Result Value Ref Range   Sodium 137 135 - 145 mEq/L   Potassium 4.5 3.5 - 5.1 mEq/L   Chloride 100 96 - 112 mEq/L   CO2 28 19 - 32 mEq/L   Glucose, Bld 98 70 - 99 mg/dL   BUN 15 6 - 23 mg/dL   Creatinine, Ser 0.75 0.40 - 1.20 mg/dL   Total Bilirubin 1.5 (H) 0.2 - 1.2 mg/dL  Alkaline Phosphatase 64 39 - 117 U/L   AST 21 0 - 37 U/L   ALT 15 0 - 35 U/L   Total Protein 7.0 6.0 - 8.3 g/dL   Albumin 4.9 3.5 - 5.2 g/dL   GFR 81.18 >60.00 mL/min   Calcium 10.2 8.4 - 10.5 mg/dL  CBC with Differential/Platelet  Result Value Ref Range   WBC 6.6 4.0 - 10.5 K/uL   RBC 4.32 3.87 - 5.11 Mil/uL   Hemoglobin 13.6 12.0 - 15.0 g/dL   HCT 39.9 36.0 - 46.0 %   MCV 92.5 78.0 - 100.0 fl   MCHC 34.2 30.0 - 36.0 g/dL   RDW 13.5 11.5 - 15.5 %   Platelets 269.0 150.0 - 400.0 K/uL   Neutrophils Relative % 61.2 43.0 - 77.0 %   Lymphocytes Relative 25.6 12.0 - 46.0 %   Monocytes Relative 8.4 3.0 - 12.0 %   Eosinophils Relative 3.9 0.0 - 5.0 %   Basophils Relative 0.9 0.0 - 3.0 %   Neutro Abs 4.1 1.4 - 7.7 K/uL   Lymphs Abs 1.7 0.7 - 4.0 K/uL   Monocytes Absolute 0.6 0.1 - 1.0 K/uL   Eosinophils Absolute 0.3 0.0 - 0.7 K/uL   Basophils Absolute 0.1 0.0 - 0.1 K/uL     Patient Active Problem  List   Diagnosis Date Noted  . Family history of sudden cardiac death 03-08-21  . Grief reaction 09/07/2020  . Medicare annual wellness visit, initial 12/02/2018  . Colon cancer screening 12/02/2018  . Colitis 11/02/2018  . Diarrhea 10/29/2018  . Genetic testing 08/26/2018  . Family history of breast cancer   . Family history of uterine cancer   . Welcome to Medicare preventive visit 10/06/2016  . Estrogen deficiency 09/18/2015  . Hypertension 03/03/2011  . Routine general medical examination at a health care facility 03/03/2011  . ALLERGIC RHINITIS 07/21/2007  . Age-related osteoporosis without fracture 07/21/2007   Past Medical History:  Diagnosis Date  . Allergy    allergic rhinitis  . Family history of breast cancer   . Family history of uterine cancer   . Gilbert's syndrome    with high bilirubin  . Hypertension   . Osteoporosis    Past Surgical History:  Procedure Laterality Date  . TONSILLECTOMY AND ADENOIDECTOMY  1957   Social History   Tobacco Use  . Smoking status: Former Smoker    Quit date: 11/10/2000    Years since quitting: 20.2  . Smokeless tobacco: Never Used  Vaping Use  . Vaping Use: Never used  Substance Use Topics  . Alcohol use: Yes    Alcohol/week: 4.0 standard drinks    Types: 4 Glasses of wine per week  . Drug use: No   Family History  Problem Relation Age of Onset  . Heart disease Mother        heart attack  . Hypertension Mother   . Breast cancer Mother        ages 79 and 47  . Hypertension Father   . Heart disease Maternal Grandfather        MI  . Hypertension Sister   . Osteopenia Sister   . Lung cancer Paternal Uncle        heavy smoker  . Heart disease Maternal Aunt   . Dementia Paternal Grandmother   . Uterine cancer Cousin        dx in her 67s  . Uterine cancer Other        MGM's  maternal aunt   Allergies  Allergen Reactions  . Boniva [Ibandronic Acid] Nausea Only  . Lisinopril Other (See Comments)    Severe  fatigue  . Norvasc [Amlodipine Besylate]     Fatigue/ depression   Current Outpatient Medications on File Prior to Visit  Medication Sig Dispense Refill  . hydrocortisone (ANUSOL-HC) 2.5 % rectal cream Place rectally 2 (two) times daily. 28 g 1  . Multiple Vitamin (MULTIVITAMIN) capsule Take 1 capsule by mouth daily.    Marland Kitchen olmesartan (BENICAR) 20 MG tablet Take 1 tablet (20 mg total) by mouth daily. (Patient taking differently: Take 10 mg by mouth daily.) 90 tablet 3  . tretinoin (RETIN-A) 0.05 % cream     . VITAMIN D PO Take 3,000 Units by mouth.     No current facility-administered medications on file prior to visit.     Review of Systems  Constitutional: Negative for activity change, appetite change, fatigue, fever and unexpected weight change.  HENT: Negative for congestion, ear pain, rhinorrhea, sinus pressure and sore throat.   Eyes: Negative for pain, redness and visual disturbance.  Respiratory: Negative for cough, shortness of breath and wheezing.   Cardiovascular: Negative for chest pain and palpitations.  Gastrointestinal: Negative for abdominal pain, blood in stool, constipation and diarrhea.  Endocrine: Negative for polydipsia and polyuria.  Genitourinary: Negative for dysuria, frequency and urgency.  Musculoskeletal: Negative for arthralgias, back pain and myalgias.  Skin: Negative for pallor and rash.  Allergic/Immunologic: Negative for environmental allergies.  Neurological: Negative for dizziness, syncope and headaches.  Hematological: Negative for adenopathy. Does not bruise/bleed easily.  Psychiatric/Behavioral: Negative for decreased concentration and dysphoric mood. The patient is not nervous/anxious.        Grief reaction        Objective:   Physical Exam Constitutional:      General: She is not in acute distress.    Appearance: Normal appearance. She is well-developed and normal weight. She is not ill-appearing or diaphoretic.  HENT:     Head:  Normocephalic and atraumatic.     Right Ear: Tympanic membrane, ear canal and external ear normal.     Left Ear: Tympanic membrane, ear canal and external ear normal.     Nose: Nose normal. No congestion.     Mouth/Throat:     Mouth: Mucous membranes are moist.     Pharynx: Oropharynx is clear. No posterior oropharyngeal erythema.  Eyes:     General: No scleral icterus.    Extraocular Movements: Extraocular movements intact.     Conjunctiva/sclera: Conjunctivae normal.     Pupils: Pupils are equal, round, and reactive to light.  Neck:     Thyroid: No thyromegaly.     Vascular: No carotid bruit or JVD.  Cardiovascular:     Rate and Rhythm: Normal rate and regular rhythm.     Pulses: Normal pulses.     Heart sounds: Normal heart sounds. No gallop.   Pulmonary:     Effort: Pulmonary effort is normal. No respiratory distress.     Breath sounds: Normal breath sounds. No wheezing.     Comments: Good air exch Chest:     Chest wall: No tenderness.  Abdominal:     General: Bowel sounds are normal. There is no distension or abdominal bruit.     Palpations: Abdomen is soft. There is no mass.     Tenderness: There is no abdominal tenderness.     Hernia: No hernia is present.  Genitourinary:  Comments: Breast exam: No mass, nodules, thickening, tenderness, bulging, retraction, inflamation, nipple discharge or skin changes noted.  No axillary or clavicular LA.     Musculoskeletal:        General: No tenderness. Normal range of motion.     Cervical back: Normal range of motion and neck supple. No rigidity. No muscular tenderness.     Right lower leg: No edema.     Left lower leg: No edema.     Comments: No kyphosis   Lymphadenopathy:     Cervical: No cervical adenopathy.  Skin:    General: Skin is warm and dry.     Coloration: Skin is not pale.     Findings: No erythema or rash.  Neurological:     Mental Status: She is alert. Mental status is at baseline.     Cranial Nerves: No  cranial nerve deficit.     Motor: No abnormal muscle tone.     Coordination: Coordination normal.     Gait: Gait normal.     Deep Tendon Reflexes: Reflexes are normal and symmetric. Reflexes normal.  Psychiatric:        Mood and Affect: Mood normal.        Cognition and Memory: Cognition and memory normal.           Assessment & Plan:   Problem List Items Addressed This Visit      Cardiovascular and Mediastinum   Hypertension - Primary    bp in fair control at this time  BP Readings from Last 1 Encounters:  02/19/21 132/85   No changes needed Most recent labs reviewed  Disc lifstyle change with low sodium diet and exercise  Plan to continue benicar 10 mg daily       Family history of sudden cardiac death    Pt is interested in cardiology referral to discuss her risks  Ref done  Healthy lifestyle  Mother and brother both died at age 89       Relevant Orders   Ambulatory referral to Cardiology     Musculoskeletal and Integument   Age-related osteoporosis without fracture    dexa ordered  One fall/no fractures D level is therapeutic Good wt bearing exercise         Other   Estrogen deficiency   Relevant Orders   DG Bone Density   Grief reaction    Doing better- in counseling after loss of her partner

## 2021-02-19 NOTE — Assessment & Plan Note (Signed)
dexa ordered  One fall/no fractures D level is therapeutic Good wt bearing exercise

## 2021-02-19 NOTE — Patient Instructions (Addendum)
If you are interested in the new shingles vaccine (Shingrix) - call your local pharmacy to check on coverage and availability  If affordable, get on a wait list at your pharmacy to get the vaccine.  Call to schedule your bone density test at the [facility] location   I placed a cardiology referral to discuss cardiac risks and possibly screening   Keep up the good work

## 2021-02-19 NOTE — Assessment & Plan Note (Signed)
bp in fair control at this time  BP Readings from Last 1 Encounters:  02/19/21 132/85   No changes needed Most recent labs reviewed  Disc lifstyle change with low sodium diet and exercise  Plan to continue benicar 10 mg daily

## 2021-02-19 NOTE — Assessment & Plan Note (Addendum)
Pt is interested in cardiology referral to discuss her risks  Ref done  Healthy lifestyle  Mother and brother both died at age 70

## 2021-03-25 DIAGNOSIS — H10413 Chronic giant papillary conjunctivitis, bilateral: Secondary | ICD-10-CM | POA: Diagnosis not present

## 2021-03-25 DIAGNOSIS — H25813 Combined forms of age-related cataract, bilateral: Secondary | ICD-10-CM | POA: Diagnosis not present

## 2021-03-25 DIAGNOSIS — H04123 Dry eye syndrome of bilateral lacrimal glands: Secondary | ICD-10-CM | POA: Diagnosis not present

## 2021-03-25 DIAGNOSIS — H43813 Vitreous degeneration, bilateral: Secondary | ICD-10-CM | POA: Diagnosis not present

## 2021-03-27 ENCOUNTER — Other Ambulatory Visit: Payer: Self-pay | Admitting: Family Medicine

## 2021-05-14 ENCOUNTER — Telehealth: Payer: Self-pay

## 2021-05-14 NOTE — Telephone Encounter (Signed)
Please set up virtual visit with first avail  Glad she is feeling better otherwise

## 2021-05-14 NOTE — Telephone Encounter (Signed)
Tested positive for covid on 6/15. Doing much better. She started having some facial pain over teeth and lingering congestion that never went away after covid.  Otc ibuprofen helps with the pain. She denies any fever, nausea or vomiting. Was not sure if she needs to take antibiotic. Would like call back from [facility] CMA with instructions.

## 2021-05-15 NOTE — Telephone Encounter (Signed)
Spoke with patient scheduled follow up appointment

## 2021-05-17 ENCOUNTER — Encounter: Payer: Self-pay | Admitting: Family Medicine

## 2021-05-17 ENCOUNTER — Telehealth (INDEPENDENT_AMBULATORY_CARE_PROVIDER_SITE_OTHER): Payer: Medicare Other | Admitting: Family Medicine

## 2021-05-17 VITALS — BP 109/76 | Temp 97.2°F | Wt 114.0 lb

## 2021-05-17 DIAGNOSIS — J0100 Acute maxillary sinusitis, unspecified: Secondary | ICD-10-CM

## 2021-05-17 MED ORDER — AMOXICILLIN-POT CLAVULANATE 875-125 MG PO TABS: 1.0000 | ORAL_TABLET | Freq: Two times a day (BID) | ORAL | 0 refills | Status: DC

## 2021-05-17 NOTE — Progress Notes (Signed)
Virtual Visit via Telephone Note  I connected with Karen Rice on 05/17/21 at 12:30 PM EDT by telephone and verified that I am speaking with the correct person using two identifiers.  Location: Patient: home Provider: office   I discussed the limitations, risks, security and privacy concerns of performing an evaluation and management service by telephone and the availability of in person appointments. I also discussed with the patient that there [place] be a patient responsible charge related to this service. The patient expressed understanding and agreed to proceed.  Parties involved in encounter  Patient: Karen Rice  Provider:  Loura Pardon MD   History of Present Illness: Pt presents with c/o sinus congestion and facial pain   Tested positive for covid on 04/24/21 Had a lot of ST and cough and congestion initially  Doing better other than the lingering congestion and sinus trouble   Pain in face-worse  Cheeks and above eyes  Tender to press on -worse on the left  Can radiate to teeth  Green and brown mucous -thick  Some pnd    No ear problems   Some seasonal allergies   No fever  Still tired-that is gradually getting better   Takes mucinex -not helping  Advil  Used nasal saline 3-4 times and netti pot    No n/v/d    Patient Active Problem List   Diagnosis Date Noted   Family history of sudden cardiac death 2021-02-25   Acute sinusitis 11/19/2020   Grief reaction 09/07/2020   Medicare annual wellness visit, initial 12/02/2018   Colon cancer screening 12/02/2018   Colitis 11/02/2018   Diarrhea 10/29/2018   Genetic testing 08/26/2018   Family history of breast cancer    Family history of uterine cancer    Welcome to Medicare preventive visit 10/06/2016   Estrogen deficiency 09/18/2015   Hypertension 03/03/2011   Routine general medical examination at a health care facility 03/03/2011   ALLERGIC RHINITIS 07/21/2007   Age-related osteoporosis without fracture  07/21/2007   Past Medical History:  Diagnosis Date   Allergy    allergic rhinitis   Family history of breast cancer    Family history of uterine cancer    Gilbert's syndrome    with high bilirubin   Hypertension    Osteoporosis    Past Surgical History:  Procedure Laterality Date   TONSILLECTOMY AND ADENOIDECTOMY  1957   Social History   Tobacco Use   Smoking status: Former    Pack years: 0.00    Types: Cigarettes    Quit date: 11/10/2000    Years since quitting: 20.5   Smokeless tobacco: Never  Vaping Use   Vaping Use: Never used  Substance Use Topics   Alcohol use: Yes    Alcohol/week: 4.0 standard drinks    Types: 4 Glasses of wine per week   Drug use: No   Family History  Problem Relation Age of Onset   Heart disease Mother        heart attack   Hypertension Mother    Breast cancer Mother        ages 76 and 92   Hypertension Father    Heart disease Maternal Grandfather        MI   Hypertension Sister    Osteopenia Sister    Lung cancer Paternal Uncle        heavy smoker   Heart disease Maternal Aunt    Dementia Paternal Grandmother    Uterine cancer Cousin  dx in her 59s   Uterine cancer Other        MGM's maternal aunt   Allergies  Allergen Reactions   Boniva [Ibandronic Acid] Nausea Only   Lisinopril Other (See Comments)    Severe fatigue   Norvasc [Amlodipine Besylate]     Fatigue/ depression   Current Outpatient Medications on File Prior to Visit  Medication Sig Dispense Refill   hydrocortisone (ANUSOL-HC) 2.5 % rectal cream Place rectally 2 (two) times daily. 28 g 1   Multiple Vitamin (MULTIVITAMIN) capsule Take 1 capsule by mouth daily.     olmesartan (BENICAR) 20 MG tablet TAKE 1 TABLET BY MOUTH DAILY 90 tablet 0   tretinoin (RETIN-A) 0.05 % cream      VITAMIN D PO Take 3,000 Units by mouth.     No current facility-administered medications on file prior to visit.   Review of Systems  Constitutional:  Negative for chills, fever  and malaise/fatigue.  HENT:  Positive for congestion and sinus pain. Negative for ear pain and sore throat.   Eyes:  Negative for blurred vision, discharge and redness.  Respiratory:  Positive for cough. Negative for sputum production, shortness of breath and stridor.   Cardiovascular:  Negative for chest pain, palpitations and leg swelling.  Gastrointestinal:  Negative for abdominal pain, diarrhea, nausea and vomiting.  Musculoskeletal:  Negative for myalgias.  Skin:  Negative for rash.  Neurological:  Negative for dizziness and headaches.   Observations/Objective: Pt sounds well  In no distress  Sounds congested but not hoarse  Notes tenderness in face on self palpation- especially L side frontal and maxillary Nl mood Nl cognition/good historian No sob/wheeze or cough noted   Assessment and Plan: Problem List Items Addressed This Visit       Respiratory   Acute sinusitis - Primary    S/p covid 19  Congestion and purulent mucous  Symptom control discussed  Add flonase daily  augmentin sent do pharmacy  Update if not starting to improve in a week or if worsening  ER parameters discussed        Relevant Medications   amoxicillin-clavulanate (AUGMENTIN) 875-125 MG tablet     Follow Up Instructions: Drink fluids and get rest  Start on flonase or other steroid nasal spray Take augmentin as directed  Update if not starting to improve in a week or if worsening   If symptoms become severe -call/go to ER Update if not starting to improve in a week or if worsening     I discussed the assessment and treatment plan with the patient. The patient was provided an opportunity to ask questions and all were answered. The patient agreed with the plan and demonstrated an understanding of the instructions.   The patient was advised to call back or seek an in-person evaluation if the symptoms worsen or if the condition fails to improve as anticipated.  I provided 17 minutes of  non-face-to-face time during this encounter.   Loura Pardon, MD

## 2021-05-18 NOTE — Patient Instructions (Signed)
Drink fluids and get rest  Start on flonase or other steroid nasal spray Take augmentin as directed  Update if not starting to improve in a week or if worsening   If symptoms become severe -call/go to ER Update if not starting to improve in a week or if worsening

## 2021-05-18 NOTE — Assessment & Plan Note (Signed)
S/p covid 19  Congestion and purulent mucous  Symptom control discussed  Add flonase daily  augmentin sent do pharmacy  Update if not starting to improve in a week or if worsening  ER parameters discussed

## 2021-05-28 ENCOUNTER — Ambulatory Visit (INDEPENDENT_AMBULATORY_CARE_PROVIDER_SITE_OTHER): Payer: Medicare Other | Admitting: Family Medicine

## 2021-05-28 ENCOUNTER — Encounter: Payer: Self-pay | Admitting: Family Medicine

## 2021-05-28 ENCOUNTER — Other Ambulatory Visit: Payer: Self-pay

## 2021-05-28 ENCOUNTER — Ambulatory Visit: Payer: Medicare Other | Admitting: Family Medicine

## 2021-05-28 VITALS — BP 138/80 | HR 86 | Temp 98.3°F | Ht 63.0 in | Wt 113.5 lb

## 2021-05-28 DIAGNOSIS — I10 Essential (primary) hypertension: Secondary | ICD-10-CM

## 2021-05-28 DIAGNOSIS — J0100 Acute maxillary sinusitis, unspecified: Secondary | ICD-10-CM | POA: Diagnosis not present

## 2021-05-28 NOTE — Progress Notes (Signed)
Subjective:    Patient ID: Karen Rice, female    DOB: 1951-03-27, 70 y.o.   MRN: [id number]  This visit occurred during the SARS-CoV-2 public health emergency.  Safety protocols were in place, including screening questions prior to the visit, additional usage of staff PPE, and extensive cleaning of exam room while observing appropriate contact time as indicated for disinfecting solutions.   HPI Pt presents for f/u of sinusitis  Wt Readings from Last 3 Encounters:  05/28/21 113 lb 8 oz (51.5 kg)  05/17/21 114 lb (51.7 kg)  03-03-21 114 lb 6 oz (51.9 kg)   20.11 kg/m   She presented on 7/8 for sinus symptoms after having covid in mid June  Had facial pain, worse on the L Seasonal allergies noted   Worse part of covid was the fatigue - getting better gradually  Still once in a while feels tired  Has had 2nd booster   Sinus symptoms are a lot better  Still just a little congestion and drip  No longer green d/c No facial pain at all  Last dose abx Friday  Took probiotics -a little constipation    Was leaving for [place] tomorrow  Hesitant about it   Was tx with augmentin  Also flonase ns   BP Readings from Last 3 Encounters:  05/28/21 138/80  05/17/21 109/76  2021/03/03 132/85    Pulse Readings from Last 3 Encounters:  05/28/21 86  2021/03/03 76  11/19/20 67    Takes bp at home Her cuff was tested and fairly accurate today  Takes benicar 10 mg daily  Patient Active Problem List   Diagnosis Date Noted   Family history of sudden cardiac death 03-Mar-2021   Acute sinusitis 11/19/2020   Grief reaction 09/07/2020   Medicare annual wellness visit, initial 12/02/2018   Colon cancer screening 12/02/2018   Colitis 11/02/2018   Diarrhea 10/29/2018   Genetic testing 08/26/2018   Family history of breast cancer    Family history of uterine cancer    Welcome to Medicare preventive visit 10/06/2016   Estrogen deficiency 09/18/2015   Hypertension 03/03/2011   Routine general  medical examination at a health care facility 03/03/2011   ALLERGIC RHINITIS 07/21/2007   Age-related osteoporosis without fracture 07/21/2007   Past Medical History:  Diagnosis Date   Allergy    allergic rhinitis   Family history of breast cancer    Family history of uterine cancer    Gilbert's syndrome    with high bilirubin   Hypertension    Osteoporosis    Past Surgical History:  Procedure Laterality Date   TONSILLECTOMY AND ADENOIDECTOMY  1957   Social History   Tobacco Use   Smoking status: Former    Types: Cigarettes    Quit date: 11/10/2000    Years since quitting: 20.5   Smokeless tobacco: Never  Vaping Use   Vaping Use: Never used  Substance Use Topics   Alcohol use: Yes    Alcohol/week: 4.0 standard drinks    Types: 4 Glasses of wine per week   Drug use: No   Family History  Problem Relation Age of Onset   Heart disease Mother        heart attack   Hypertension Mother    Breast cancer Mother        ages 81 and 50   Hypertension Father    Heart disease Maternal Grandfather        MI   Hypertension Sister  Osteopenia Sister    Lung cancer Paternal Uncle        heavy smoker   Heart disease Maternal Aunt    Dementia Paternal Grandmother    Uterine cancer Cousin        dx in her 22s   Uterine cancer Other        MGM's maternal aunt   Allergies  Allergen Reactions   Boniva [Ibandronic Acid] Nausea Only   Lisinopril Other (See Comments)    Severe fatigue   Norvasc [Amlodipine Besylate]     Fatigue/ depression   Current Outpatient Medications on File Prior to Visit  Medication Sig Dispense Refill   hydrocortisone (ANUSOL-HC) 2.5 % rectal cream Place rectally 2 (two) times daily. 28 g 1   Multiple Vitamin (MULTIVITAMIN) capsule Take 1 capsule by mouth daily.     olmesartan (BENICAR) 20 MG tablet TAKE 1 TABLET BY MOUTH DAILY (Patient taking differently: 10 mg.) 90 tablet 0   tretinoin (RETIN-A) 0.05 % cream      VITAMIN D PO Take 3,000 Units by  mouth.     No current facility-administered medications on file prior to visit.     Review of Systems  Constitutional:  Negative for activity change, appetite change, fatigue, fever and unexpected weight change.  HENT:  Positive for congestion. Negative for ear pain, rhinorrhea, sinus pressure and sore throat.   Eyes:  Negative for pain, redness and visual disturbance.  Respiratory:  Negative for cough, shortness of breath and wheezing.   Cardiovascular:  Negative for chest pain and palpitations.  Gastrointestinal:  Negative for abdominal pain, blood in stool, constipation and diarrhea.  Endocrine: Negative for polydipsia and polyuria.  Genitourinary:  Negative for dysuria, frequency and urgency.  Musculoskeletal:  Negative for arthralgias, back pain and myalgias.  Skin:  Negative for pallor and rash.  Allergic/Immunologic: Negative for environmental allergies.  Neurological:  Negative for dizziness, syncope and headaches.  Hematological:  Negative for adenopathy. Does not bruise/bleed easily.  Psychiatric/Behavioral:  Negative for decreased concentration and dysphoric mood. The patient is not nervous/anxious.       Objective:   Physical Exam Constitutional:      General: She is not in acute distress.    Appearance: Normal appearance. She is well-developed and normal weight. She is not ill-appearing or diaphoretic.  HENT:     Head: Normocephalic and atraumatic.     Comments: No facial tenderness    Right Ear: Tympanic membrane, ear canal and external ear normal.     Left Ear: Tympanic membrane, ear canal and external ear normal.     Nose: Congestion present.     Comments: L nostril is slightly more congested than the R    Mouth/Throat:     Mouth: Mucous membranes are moist.     Pharynx: Oropharynx is clear.  Eyes:     General:        Right eye: No discharge.        Left eye: No discharge.     Conjunctiva/sclera: Conjunctivae normal.     Pupils: Pupils are equal, round, and  reactive to light.  Neck:     Thyroid: No thyromegaly.     Vascular: No carotid bruit or JVD.  Cardiovascular:     Rate and Rhythm: Normal rate and regular rhythm.     Heart sounds: Normal heart sounds.    No gallop.  Pulmonary:     Effort: Pulmonary effort is normal. No respiratory distress.     Breath  sounds: Normal breath sounds. No wheezing or rales.  Abdominal:     General: Bowel sounds are normal. There is no distension or abdominal bruit.     Palpations: Abdomen is soft. There is no mass.     Tenderness: There is no abdominal tenderness.  Musculoskeletal:     Cervical back: Normal range of motion and neck supple.     Right lower leg: No edema.     Left lower leg: No edema.  Lymphadenopathy:     Cervical: No cervical adenopathy.  Skin:    General: Skin is warm and dry.     Coloration: Skin is not pale.     Findings: No rash.  Neurological:     Mental Status: She is alert.     Cranial Nerves: No cranial nerve deficit.     Coordination: Coordination normal.     Deep Tendon Reflexes: Reflexes are normal and symmetric. Reflexes normal.  Psychiatric:        Mood and Affect: Mood normal.          Assessment & Plan:   Problem List Items Addressed This Visit       Cardiovascular and Mediastinum   Hypertension    BP: 138/80  bp cuff tested as accurate today  Home readings much lower w/o orthostatic symptoms  Plan to continue benicar 10 mg daily          Respiratory   Acute sinusitis - Primary    Much improvement after tx with augmentin  Some left over fatigue (started with covid)  Mild congestion  Disc treating symptoms  Fluid and rest  flonase for 2 more weeks for mild congestion

## 2021-05-28 NOTE — Assessment & Plan Note (Signed)
Much improvement after tx with augmentin  Some left over fatigue (started with covid)  Mild congestion  Disc treating symptoms  Fluid and rest  flonase for 2 more weeks for mild congestion

## 2021-05-28 NOTE — Patient Instructions (Signed)
Take care of yourself   Use flonse daily for 2 weeks  Rest when you need to  Drink lots of fluids   Update if not starting to improve in a week or if worsening

## 2021-05-28 NOTE — Assessment & Plan Note (Signed)
BP: 138/80  bp cuff tested as accurate today  Home readings much lower w/o orthostatic symptoms  Plan to continue benicar 10 mg daily

## 2021-06-14 ENCOUNTER — Other Ambulatory Visit: Payer: Self-pay

## 2021-06-14 NOTE — Telephone Encounter (Signed)
Patient called in asking for another round of abx. Patient saw Dr. Glori Bickers on 05/28/21 but had already finished her abx before that appt. Patient stated she is still having blood in her mucus and facial pain and thinks she needs another abx. She has been taking her allergy meds, using flonase and a netty pot.

## 2021-06-17 ENCOUNTER — Other Ambulatory Visit: Payer: Self-pay | Admitting: Family Medicine

## 2021-06-17 MED ORDER — AMOXICILLIN-POT CLAVULANATE 875-125 MG PO TABS: 1.0000 | ORAL_TABLET | Freq: Two times a day (BID) | ORAL | 0 refills | Status: DC

## 2021-06-17 MED ORDER — PREDNISONE 10 MG PO TABS: ORAL_TABLET | ORAL | 0 refills | Status: DC

## 2021-06-17 NOTE — Telephone Encounter (Signed)
Patient left a voicemail requesting a refill on an antibiotic for an ongoing sinus infection. Pharmacy [facility]

## 2021-06-17 NOTE — Addendum Note (Signed)
Addended by: Loura Pardon A on: 06/17/2021 08:42 PM   Modules accepted: Orders

## 2021-06-17 NOTE — Telephone Encounter (Signed)
I pended both augmentin and prednisone  Prednisone Eisenmenger help open up sinuses to prevent re occurrence Please let her know re: side effects and see if she wants it  I do think it will help   Update if not starting to improve in a week or if worsening

## 2021-06-17 NOTE — Addendum Note (Signed)
Addended by: Loura Pardon A on: 06/17/2021 10:27 AM   Modules accepted: Orders

## 2021-07-03 ENCOUNTER — Other Ambulatory Visit: Payer: Self-pay

## 2021-07-03 ENCOUNTER — Ambulatory Visit (INDEPENDENT_AMBULATORY_CARE_PROVIDER_SITE_OTHER): Payer: Medicare Other | Admitting: Family Medicine

## 2021-07-03 ENCOUNTER — Encounter: Payer: Self-pay | Admitting: Family Medicine

## 2021-07-03 VITALS — BP 140/82 | HR 72 | Temp 98.0°F | Ht 63.0 in | Wt 111.4 lb

## 2021-07-03 DIAGNOSIS — R5382 Chronic fatigue, unspecified: Secondary | ICD-10-CM | POA: Diagnosis not present

## 2021-07-03 DIAGNOSIS — R5383 Other fatigue: Secondary | ICD-10-CM | POA: Insufficient documentation

## 2021-07-03 DIAGNOSIS — J0100 Acute maxillary sinusitis, unspecified: Secondary | ICD-10-CM

## 2021-07-03 NOTE — Patient Instructions (Signed)
Use the flonase if allergies get worse   Sinus irrigation is great - use that as needed   If fatigue does not improve let me know

## 2021-07-03 NOTE — Assessment & Plan Note (Signed)
Suspect post viral /post infectious  Also some grief in year 2 of loosing spouse  Reassuring exam  Enc good nutrition - wt loss noted  Urged to update if no improvement in the coming weeks

## 2021-07-03 NOTE — Progress Notes (Signed)
Subjective:    Patient ID: Karen Rice, female    DOB: [birth year], 70 y.o.   MRN: [id number]  This visit occurred during the SARS-CoV-2 public health emergency.  Safety protocols were in place, including screening questions prior to the visit, additional usage of staff PPE, and extensive cleaning of exam room while observing appropriate contact time as indicated for disinfecting solutions.   HPI Pt presents with c/o of sinus problems   Wt Readings from Last 3 Encounters:  07/03/21 111 lb 6 oz (50.5 kg)  05/28/21 113 lb 8 oz (51.5 kg)  05/17/21 114 lb (51.7 kg)   19.73 kg/m Is eating more now  Lost a little   Was treated for bacterial sinusitis in early July with augmentin  Had residual congestion and used flonase  Worsened and we re treated with augmentin and flonase   2nd round of abx was hard on her Very fatigued  This has improved-still a little fatigued   She did test herself for covid   Now better overall  No facial pain  No ear pain  No colored nasal d/c  Uses netti pot    Usually fall is worst allergy season but not too bad   Grief could be adding to this   Patient Active Problem List   Diagnosis Date Noted   Family history of sudden cardiac death 03-13-21   Acute sinusitis 11/19/2020   Grief reaction 09/07/2020   Medicare annual wellness visit, initial 12/02/2018   Colon cancer screening 12/02/2018   Colitis 11/02/2018   Diarrhea 10/29/2018   Genetic testing 08/26/2018   Family history of breast cancer    Family history of uterine cancer    Welcome to Medicare preventive visit 10/06/2016   Estrogen deficiency 09/18/2015   Hypertension 03/03/2011   Routine general medical examination at a health care facility 03/03/2011   ALLERGIC RHINITIS 07/21/2007   Age-related osteoporosis without fracture 07/21/2007   Past Medical History:  Diagnosis Date   Allergy    allergic rhinitis   Family history of breast cancer    Family history of uterine cancer     Gilbert's syndrome    with high bilirubin   Hypertension    Osteoporosis    Past Surgical History:  Procedure Laterality Date   TONSILLECTOMY AND ADENOIDECTOMY  1957   Social History   Tobacco Use   Smoking status: Former    Types: Cigarettes    Quit date: 11/10/2000    Years since quitting: 20.6   Smokeless tobacco: Never  Vaping Use   Vaping Use: Never used  Substance Use Topics   Alcohol use: Yes    Alcohol/week: 4.0 standard drinks    Types: 4 Glasses of wine per week   Drug use: No   Family History  Problem Relation Age of Onset   Heart disease Mother        heart attack   Hypertension Mother    Breast cancer Mother        ages 60 and 44   Hypertension Father    Heart disease Maternal Grandfather        MI   Hypertension Sister    Osteopenia Sister    Lung cancer Paternal Uncle        heavy smoker   Heart disease Maternal Aunt    Dementia Paternal Grandmother    Uterine cancer Cousin        dx in her 18s   Uterine cancer Other  MGM's maternal aunt   Allergies  Allergen Reactions   Boniva [Ibandronic Acid] Nausea Only   Lisinopril Other (See Comments)    Severe fatigue   Norvasc [Amlodipine Besylate]     Fatigue/ depression   Current Outpatient Medications on File Prior to Visit  Medication Sig Dispense Refill   hydrocortisone (ANUSOL-HC) 2.5 % rectal cream Place rectally 2 (two) times daily. 28 g 1   Multiple Vitamin (MULTIVITAMIN) capsule Take 1 capsule by mouth daily.     olmesartan (BENICAR) 20 MG tablet TAKE 1 TABLET BY MOUTH DAILY (Patient taking differently: 10 mg.) 90 tablet 0   tretinoin (RETIN-A) 0.05 % cream      VITAMIN D PO Take 3,000 Units by mouth.     No current facility-administered medications on file prior to visit.     Review of Systems  Constitutional:  Positive for fatigue. Negative for activity change, appetite change, fever and unexpected weight change.  HENT:  Positive for postnasal drip. Negative for congestion,  ear pain, rhinorrhea, sinus pressure and sore throat.   Eyes:  Negative for pain, redness and visual disturbance.  Respiratory:  Negative for cough, shortness of breath and wheezing.   Cardiovascular:  Negative for chest pain and palpitations.  Gastrointestinal:  Negative for abdominal pain, blood in stool, constipation and diarrhea.  Endocrine: Negative for polydipsia and polyuria.  Genitourinary:  Negative for dysuria, frequency and urgency.  Musculoskeletal:  Negative for arthralgias, back pain and myalgias.  Skin:  Negative for pallor and rash.  Allergic/Immunologic: Negative for environmental allergies.  Neurological:  Negative for dizziness, syncope and headaches.  Hematological:  Negative for adenopathy. Does not bruise/bleed easily.  Psychiatric/Behavioral:  Negative for decreased concentration and dysphoric mood. The patient is not nervous/anxious.       Objective:   Physical Exam Constitutional:      General: She is not in acute distress.    Appearance: Normal appearance. She is normal weight. She is not ill-appearing or diaphoretic.  HENT:     Head: Normocephalic and atraumatic.     Comments: No sinus tenderness     Right Ear: Tympanic membrane, ear canal and external ear normal. There is no impacted cerumen.     Left Ear: Tympanic membrane, ear canal and external ear normal. There is no impacted cerumen.     Nose: Nose normal.     Mouth/Throat:     Mouth: Mucous membranes are moist.     Pharynx: Oropharynx is clear. No posterior oropharyngeal erythema.  Eyes:     General:        Right eye: No discharge.        Left eye: No discharge.     Conjunctiva/sclera: Conjunctivae normal.     Pupils: Pupils are equal, round, and reactive to light.  Cardiovascular:     Rate and Rhythm: Normal rate and regular rhythm.     Heart sounds: Normal heart sounds.  Pulmonary:     Effort: Pulmonary effort is normal. No respiratory distress.     Breath sounds: Normal breath sounds. No  wheezing or rales.  Musculoskeletal:     Cervical back: Normal range of motion and neck supple.  Lymphadenopathy:     Cervical: No cervical adenopathy.  Skin:    General: Skin is warm and dry.     Findings: No rash.  Neurological:     Mental Status: She is alert.     Cranial Nerves: No cranial nerve deficit.  Psychiatric:  Mood and Affect: Mood normal.     Comments: Pleasant  Mood is good today          Assessment & Plan:   Problem List Items Addressed This Visit       Respiratory   Acute sinusitis - Primary    After 2 rounds of augmentin -significantly improved Never took prednisone  Enc use of steroid ns through the season  Some fatigue (? If post viral or post infx)  Could be grief related as well  Reassuring exam  Enc good self care  Will update if no further improvement        Other   Fatigue    Suspect post viral /post infectious  Also some grief in year 2 of loosing spouse  Reassuring exam  Enc good nutrition - wt loss noted  Urged to update if no improvement in the coming weeks

## 2021-07-03 NOTE — Assessment & Plan Note (Signed)
After 2 rounds of augmentin -significantly improved Never took prednisone  Enc use of steroid ns through the season  Some fatigue (? If post viral or post infx)  Could be grief related as well  Reassuring exam  Enc good self care  Will update if no further improvement

## 2021-07-05 ENCOUNTER — Ambulatory Visit: Payer: Medicare Other | Admitting: Internal Medicine

## 2021-07-11 DIAGNOSIS — L821 Other seborrheic keratosis: Secondary | ICD-10-CM | POA: Diagnosis not present

## 2021-07-11 DIAGNOSIS — D1801 Hemangioma of skin and subcutaneous tissue: Secondary | ICD-10-CM | POA: Diagnosis not present

## 2021-07-11 DIAGNOSIS — L814 Other melanin hyperpigmentation: Secondary | ICD-10-CM | POA: Diagnosis not present

## 2021-07-11 DIAGNOSIS — D224 Melanocytic nevi of scalp and neck: Secondary | ICD-10-CM | POA: Diagnosis not present

## 2021-07-25 ENCOUNTER — Telehealth: Payer: Self-pay | Admitting: Family Medicine

## 2021-07-25 NOTE — Telephone Encounter (Signed)
Can't read image. Management is working on it

## 2021-08-01 ENCOUNTER — Other Ambulatory Visit: Payer: Self-pay | Admitting: Obstetrics & Gynecology

## 2021-08-01 DIAGNOSIS — Z1231 Encounter for screening mammogram for malignant neoplasm of breast: Secondary | ICD-10-CM

## 2021-08-05 ENCOUNTER — Other Ambulatory Visit: Payer: Self-pay | Admitting: Family Medicine

## 2021-08-05 DIAGNOSIS — E2839 Other primary ovarian failure: Secondary | ICD-10-CM

## 2021-08-20 ENCOUNTER — Ambulatory Visit (INDEPENDENT_AMBULATORY_CARE_PROVIDER_SITE_OTHER): Payer: Medicare Other

## 2021-08-20 ENCOUNTER — Other Ambulatory Visit: Payer: Self-pay

## 2021-08-20 DIAGNOSIS — Z23 Encounter for immunization: Secondary | ICD-10-CM | POA: Diagnosis not present

## 2021-08-28 ENCOUNTER — Telehealth (INDEPENDENT_AMBULATORY_CARE_PROVIDER_SITE_OTHER): Payer: Medicare Other | Admitting: Family Medicine

## 2021-08-28 ENCOUNTER — Other Ambulatory Visit: Payer: Self-pay

## 2021-08-28 ENCOUNTER — Encounter: Payer: Self-pay | Admitting: Family Medicine

## 2021-08-28 DIAGNOSIS — J3489 Other specified disorders of nose and nasal sinuses: Secondary | ICD-10-CM | POA: Insufficient documentation

## 2021-08-28 MED ORDER — CEFUROXIME AXETIL 250 MG PO TABS: 250.0000 mg | ORAL_TABLET | Freq: Two times a day (BID) | ORAL | 0 refills | Status: AC

## 2021-08-28 NOTE — Progress Notes (Signed)
Virtual Visit via Video Note  I connected with Karen Rice on 08/28/21 at  2:00 PM EDT by a video enabled telemedicine application and verified that I am speaking with the correct person using two identifiers.  Location: Patient: home Provider: office   I discussed the limitations of evaluation and management by telemedicine and the availability of in person appointments. The patient expressed understanding and agreed to proceed.  Parties involved in encounter  Patient: Karen Rice  Provider:  Loura Pardon MD   History of Present Illness: Pt presents with c/o sinus problem   Getting ready to go out of town  Was in [place] over the weekend   Sneezing and allergies  Mucous is clear Sinus pressure  Neg covid test   Saw Dr Ernesto Rutherford in the past= he retired and she will see one of his partners in December for a follow up   Struggling all summer with sinus pain/infection  Had 2 courses of augmentin and prednisone   it did get better   Patient Active Problem List   Diagnosis Date Noted   Fatigue 07/03/2021   Family history of sudden cardiac death 2021/03/07   Acute sinusitis 11/19/2020   Grief reaction 09/07/2020   Medicare annual wellness visit, initial 12/02/2018   Colon cancer screening 12/02/2018   Colitis 11/02/2018   Diarrhea 10/29/2018   Genetic testing 08/26/2018   Family history of breast cancer    Family history of uterine cancer    Welcome to Medicare preventive visit 10/06/2016   Estrogen deficiency 09/18/2015   Hypertension 03/03/2011   Routine general medical examination at a health care facility 03/03/2011   ALLERGIC RHINITIS 07/21/2007   Age-related osteoporosis without fracture 07/21/2007   Past Medical History:  Diagnosis Date   Allergy    allergic rhinitis   Family history of breast cancer    Family history of uterine cancer    Gilbert's syndrome    with high bilirubin   Hypertension    Osteoporosis    Past Surgical History:  Procedure Laterality  Date   TONSILLECTOMY AND ADENOIDECTOMY  1957   Social History   Tobacco Use   Smoking status: Former    Types: Cigarettes    Quit date: 11/10/2000    Years since quitting: 20.8   Smokeless tobacco: Never  Vaping Use   Vaping Use: Never used  Substance Use Topics   Alcohol use: Yes    Alcohol/week: 4.0 standard drinks    Types: 4 Glasses of wine per week   Drug use: No   Family History  Problem Relation Age of Onset   Heart disease Mother        heart attack   Hypertension Mother    Breast cancer Mother        ages 44 and 3   Hypertension Father    Heart disease Maternal Grandfather        MI   Hypertension Sister    Osteopenia Sister    Lung cancer Paternal Uncle        heavy smoker   Heart disease Maternal Aunt    Dementia Paternal Grandmother    Uterine cancer Cousin        dx in her 3s   Uterine cancer Other        MGM's maternal aunt   Allergies  Allergen Reactions   Boniva [Ibandronic Acid] Nausea Only   Lisinopril Other (See Comments)    Severe fatigue   Norvasc [Amlodipine Besylate]  Fatigue/ depression   Current Outpatient Medications on File Prior to Visit  Medication Sig Dispense Refill   Ascorbic Acid (VITAMIN C) 1000 MG tablet Take 1,000 mg by mouth daily.     Multiple Vitamin (MULTIVITAMIN) capsule Take 1 capsule by mouth daily.     olmesartan (BENICAR) 20 MG tablet TAKE 1 TABLET BY MOUTH DAILY (Patient taking differently: 10 mg.) 90 tablet 0   pyridOXINE (VITAMIN B-6) 50 MG tablet Take 50 mg by mouth daily.     tretinoin (RETIN-A) 0.05 % cream Uses seasonally     VITAMIN D PO Take 3,000 Units by mouth.     No current facility-administered medications on file prior to visit.   Review of Systems  Constitutional:  Negative for chills, fever and malaise/fatigue.  HENT:  Positive for congestion. Negative for ear pain, sinus pain, sore throat and tinnitus.        Sinus pressure  Eyes:  Negative for blurred vision, discharge and redness.   Respiratory:  Negative for cough, shortness of breath and stridor.   Cardiovascular:  Negative for chest pain, palpitations and leg swelling.  Gastrointestinal:  Negative for abdominal pain, diarrhea, nausea and vomiting.  Musculoskeletal:  Negative for myalgias.  Skin:  Negative for rash.  Neurological:  Negative for dizziness and headaches.   Observations/Objective:  Patient appears well, in no distress Weight is baseline  No facial swelling or asymmetry Normal voice-not hoarse and no slurred speech No obvious tremor or mobility impairment Moving neck and UEs normally Able to hear the call well  No cough or shortness of breath during interview  Talkative and mentally sharp with no cognitive changes No skin changes on face or neck , no rash or pallor Affect is normal    Assessment and Plan: Problem List Items Addressed This Visit       Other   Sinus pressure    In pt with recurrent bacterial sinus infection  Currently just pressure and clear mucous Is traveling soon and wants to have abx on hand if worsening  ceftin 250 mg bid for 10d px for use while gone if needed Will watch for persistent facial pain and purulent nasal drainage and elevated temp  Disc use of flonase to keep congestion in control  Avoid allergens when able  Update if not starting to improve in a week or if worsening  Pt plans to see ENT in December/scheduled        Follow Up Instructions: Go ahead and fill px for ceftin 250 mg bid for 10 days  Start it if your congestion worsens and you experience sinus/facial pain, yellow and green nasal discharge and/or fever  Alert [place] if you worsen or fail to improve Nasal saline will help congestion if needed Also continue flonase daily    I discussed the assessment and treatment plan with the patient. The patient was provided an opportunity to ask questions and all were answered. The patient agreed with the plan and demonstrated an understanding of the  instructions.   The patient was advised to call back or seek an in-person evaluation if the symptoms worsen or if the condition fails to improve as anticipated.    Loura Pardon, MD

## 2021-08-28 NOTE — Assessment & Plan Note (Addendum)
In pt with recurrent bacterial sinus infection  Currently just pressure and clear mucous Is traveling soon and wants to have abx on hand if worsening  ceftin 250 mg bid for 10d px for use while gone if needed Will watch for persistent facial pain and purulent nasal drainage and elevated temp  Disc use of flonase to keep congestion in control  Avoid allergens when able  Update if not starting to improve in a week or if worsening  Pt plans to see ENT in December/scheduled

## 2021-08-28 NOTE — Patient Instructions (Addendum)
Go ahead and fill px for ceftin 250 mg bid for 10 days  Start it if your congestion worsens and you experience sinus/facial pain, yellow and green nasal discharge and/or fever  Alert [place] if you worsen or fail to improve Nasal saline will help congestion if needed Also continue flonase or other steroid nasal spray daily

## 2021-09-02 ENCOUNTER — Ambulatory Visit (HOSPITAL_BASED_OUTPATIENT_CLINIC_OR_DEPARTMENT_OTHER): Payer: Medicare Other | Admitting: Obstetrics & Gynecology

## 2021-09-04 DIAGNOSIS — Z20828 Contact with and (suspected) exposure to other viral communicable diseases: Secondary | ICD-10-CM | POA: Diagnosis not present

## 2021-09-06 ENCOUNTER — Other Ambulatory Visit (HOSPITAL_BASED_OUTPATIENT_CLINIC_OR_DEPARTMENT_OTHER): Payer: Self-pay | Admitting: Obstetrics & Gynecology

## 2021-09-06 MED ORDER — HYDROCORTISONE (PERIANAL) 2.5 % EX CREA: TOPICAL_CREAM | Freq: Two times a day (BID) | CUTANEOUS | 1 refills | Status: DC

## 2021-09-09 ENCOUNTER — Ambulatory Visit (HOSPITAL_BASED_OUTPATIENT_CLINIC_OR_DEPARTMENT_OTHER): Payer: Medicare Other | Admitting: Obstetrics & Gynecology

## 2021-09-19 ENCOUNTER — Encounter: Payer: Self-pay | Admitting: Internal Medicine

## 2021-09-19 ENCOUNTER — Ambulatory Visit (INDEPENDENT_AMBULATORY_CARE_PROVIDER_SITE_OTHER): Payer: Medicare Other | Admitting: Internal Medicine

## 2021-09-19 ENCOUNTER — Other Ambulatory Visit: Payer: Self-pay

## 2021-09-19 VITALS — BP 118/68 | HR 78 | Ht 63.0 in | Wt 112.0 lb

## 2021-09-19 DIAGNOSIS — I10 Essential (primary) hypertension: Secondary | ICD-10-CM | POA: Diagnosis not present

## 2021-09-19 NOTE — Progress Notes (Signed)
Cardiology Office Note   Date:  09/19/2021   ID:  Karen Rice, DOB Jun 21, 1951, MRN [id number]  PCP:  Abner Greenspan, MD  Cardiologist:   Dorris Carnes, MD   Pt presents for evaluation  of [place] of sudden death and MI      History of Present Illness: Karen Rice is a 70 y.o. female with a no known history of CAD   Pt says she feels great    Came in today to review Fhx   Discuss cardiac risks.    The pt says her  mother who was a smoker and also had breast cancer died at age 65    She says her mom had not been feeing good    Went to Urgent care  Dx with diverticulitis.  She was hospitalized   Told she had an MI out of hosp.  Became SOB   Developed resp arrest   Never woke up    Mternal GF   Also a big smoker died in his sleep at age75    No other FHx of MI or sudden death  The pt is active   Walks 4 to 6 miles per day   No CP  no SOB    No palpitations   No dizziness   No syncope      Current Meds  Medication Sig   amoxicillin (AMOXIL) 500 MG capsule Take 3 capsules by mouth daily.   Ascorbic Acid (VITAMIN C) 1000 MG tablet Take 1,000 mg by mouth daily.   hydrocortisone (ANUSOL-HC) 2.5 % rectal cream Place rectally 2 (two) times daily. Can use for up to 7 days at a time.   Multiple Vitamin (MULTIVITAMIN) capsule Take 1 capsule by mouth daily.   olmesartan (BENICAR) 20 MG tablet TAKE 1 TABLET BY MOUTH DAILY (Patient taking differently: 10 mg.)   pyridOXINE (VITAMIN B-6) 50 MG tablet Take 50 mg by mouth daily.   tretinoin (RETIN-A) 0.05 % cream Uses seasonally   VITAMIN D PO Take 3,000 Units by mouth.     Allergies:   Boniva [ibandronic acid], Lisinopril, and Norvasc [amlodipine besylate]   Past Medical History:  Diagnosis Date   Allergy    allergic rhinitis   Family history of breast cancer    Family history of uterine cancer    Gilbert's syndrome    with high bilirubin   Hypertension    Osteoporosis     Past Surgical History:  Procedure Laterality Date   TONSILLECTOMY AND  ADENOIDECTOMY  1957     Social History:  The patient  reports that she quit smoking about 20 years ago. Her smoking use included cigarettes. She has never used smokeless tobacco. She reports current alcohol use of about 4.0 standard drinks per week. She reports that she does not use drugs.   Family History:  The patient's family history includes Breast cancer in her mother; Dementia in her paternal grandmother; Heart disease in her maternal aunt, maternal grandfather, and mother; Hypertension in her father, mother, and sister; Lung cancer in her paternal uncle; Osteopenia in her sister; Uterine cancer in her cousin and another family member.    ROS:  Please see the history of present illness. All other systems are reviewed and  Negative to the above problem except as noted.    PHYSICAL EXAM: VS:  BP 118/68 (BP Location: Left Arm, Patient Position: Sitting, Cuff Size: Normal)   Pulse 78   Ht 5\' 3"  (1.6 m)  Wt 112 lb (50.8 kg)   LMP 11/10/2002   SpO2 96%   BMI 19.84 kg/m   GEN: Well nourished, well developed, in no acute distress  HEENT: normal  Neck: no JVD, carotid bruits, Cardiac: RRR; no murmurs,   No LE edema  Respiratory:  clear to auscultation bilaterally,  GI: soft, nontender, nondistended, + BS  No hepatomegaly  MS: no deformity Moving all extremities   Skin: warm and dry, no rash Neuro:  Strength and sensation are intact Psych: euthymic mood, full affect   EKG:  EKG is ordered today.SR   78 bpm   Sl sagging of ST segments  QT not prolonged     Lipid Panel    Component Value Date/Time   CHOL 200 01/31/2021 0810   TRIG 57.0 01/31/2021 0810   HDL 92.70 01/31/2021 0810   CHOLHDL 2 01/31/2021 0810   VLDL 11.4 01/31/2021 0810   LDLCALC 96 01/31/2021 0810   LDLDIRECT 94.6 07/19/2012 0813      Wt Readings from Last 3 Encounters:  09/19/21 112 lb (50.8 kg)  08/28/21 111 lb (50.3 kg)  07/03/21 111 lb 6 oz (50.5 kg)      ASSESSMENT AND PLAN:  1 Risk  stratifification  Pt with Hx of MGF dying in sleep    Sounds like her mother had out of hosp MI  Admitted   Died of respiratory complication    Hx does not go along with an inherited rhythm problem   The pt's exam is normal    Lipids are excellent   CT of abdomen in past without atherosclerosis.   EKG is OK, no significant QT prolongation.  2  HTN   GOod contrlo  Will follow up in 18 months   Sooner or problems   Current medicines are reviewed at length with the patient today.  The patient does not have concerns regarding medicines.  Signed, Dorris Carnes, MD  09/19/2021 4:09 PM    [facility] [place], [place], [place]  [address] Phone: [phone]; Fax: [phone]

## 2021-09-19 NOTE — Progress Notes (Signed)
12  

## 2021-09-19 NOTE — Patient Instructions (Signed)
Medication Instructions:  Your physician recommends that you continue on your current medications as directed. Please refer to the Current Medication list given to you today.  *If you need a refill on your cardiac medications before your next appointment, please call your pharmacy*   Lab Work: none3 If you have labs (blood work) drawn today and your tests are completely normal, you will receive your results only by: [place] (if you have MyChart) OR A paper copy in the mail If you have any lab test that is abnormal or we need to change your treatment, we will call you to review the results.   Testing/Procedures: none   Follow-Up: At [facility], you and your health needs are our priority.  As part of our continuing mission to provide you with exceptional heart care, we have created designated Provider Care Teams.  These Care Teams include your primary Cardiologist (physician) and Advanced Practice Providers (APPs -  Physician Assistants and Nurse Practitioners) who all work together to provide you with the care you need, when you need it.  We recommend signing up for the patient portal called "MyChart".  Sign up information is provided on this After Visit Summary.  MyChart is used to connect with patients for Virtual Visits (Telemedicine).  Patients are able to view lab/test results, encounter notes, upcoming appointments, etc.  Non-urgent messages can be sent to your provider as well.   To learn more about what you can do with MyChart, go to [email or web address].    Your next appointment: January 2024

## 2021-09-23 ENCOUNTER — Encounter: Payer: Self-pay | Admitting: Family Medicine

## 2021-09-23 ENCOUNTER — Ambulatory Visit
Admission: RE | Admit: 2021-09-23 | Discharge: 2021-09-23 | Disposition: A | Discharge status: Home / Self Care | Payer: Medicare Other | Source: Ambulatory Visit | Attending: Obstetrics & Gynecology | Admitting: Obstetrics & Gynecology

## 2021-09-23 DIAGNOSIS — Z1231 Encounter for screening mammogram for malignant neoplasm of breast: Secondary | ICD-10-CM | POA: Diagnosis not present

## 2021-09-26 DIAGNOSIS — Z23 Encounter for immunization: Secondary | ICD-10-CM | POA: Diagnosis not present

## 2021-10-24 DIAGNOSIS — J310 Chronic rhinitis: Secondary | ICD-10-CM | POA: Diagnosis not present

## 2021-10-24 DIAGNOSIS — J342 Deviated nasal septum: Secondary | ICD-10-CM | POA: Diagnosis not present

## 2021-10-24 DIAGNOSIS — J343 Hypertrophy of nasal turbinates: Secondary | ICD-10-CM | POA: Diagnosis not present

## 2021-11-15 ENCOUNTER — Encounter (HOSPITAL_BASED_OUTPATIENT_CLINIC_OR_DEPARTMENT_OTHER): Payer: Self-pay | Admitting: Obstetrics & Gynecology

## 2021-11-15 ENCOUNTER — Other Ambulatory Visit: Payer: Self-pay

## 2021-11-15 ENCOUNTER — Ambulatory Visit (INDEPENDENT_AMBULATORY_CARE_PROVIDER_SITE_OTHER): Payer: Medicare Other | Admitting: Obstetrics & Gynecology

## 2021-11-15 VITALS — BP 157/90 | HR 93 | Ht 63.5 in | Wt 114.0 lb

## 2021-11-15 DIAGNOSIS — Z803 Family history of malignant neoplasm of breast: Secondary | ICD-10-CM

## 2021-11-15 DIAGNOSIS — M818 Other osteoporosis without current pathological fracture: Secondary | ICD-10-CM | POA: Diagnosis not present

## 2021-11-15 DIAGNOSIS — I10 Essential (primary) hypertension: Secondary | ICD-10-CM | POA: Diagnosis not present

## 2021-11-15 DIAGNOSIS — K649 Unspecified hemorrhoids: Secondary | ICD-10-CM

## 2021-11-15 DIAGNOSIS — Z01419 Encounter for gynecological examination (general) (routine) without abnormal findings: Secondary | ICD-10-CM | POA: Diagnosis not present

## 2021-11-15 DIAGNOSIS — Z8049 Family history of malignant neoplasm of other genital organs: Secondary | ICD-10-CM | POA: Diagnosis not present

## 2021-11-15 NOTE — Progress Notes (Signed)
71 y.o. [id number] Divorced White or Caucasian female here for breast and pelvic exam.  Reports this year has been more difficult than last year after loss of partner.    Having a little issue with hemorrhoids.  She is using the topical hydrocortisone.  Does not need refill.  Rarely has bleeding.  Does have some irritation with itching.  Considering banding.  Denies vaginal bleeding.  Has white coat hypertension.  Brought blood pressures with her today.  These have all ranged from 104-124/ 61-77.  Is on Benicar.  Has seen Dr. Harrington Challenger for consult.  Patient's last menstrual period was 11/10/2002.          Sexually active: No.  H/O STD:  no  Health Maintenance: PCP:  Dr. Glori Bickers.  Last wellness appt was 01/2021.  Did blood work at that appt:  yes Vaccines are up to date:  has not done shingles vaccination Colonoscopy:  07/14/2019, follow up  MMG:  09/23/2021 Negative BMD:  01/16/2016 Osteoporosis Last pap smear:  06/17/2019 Negative.   H/o abnormal pap smear:  no recent abnormal   reports that she quit smoking about 21 years ago. Her smoking use included cigarettes. She has never used smokeless tobacco. She reports current alcohol use of about 4.0 standard drinks per week. She reports that she does not use drugs.  Past Medical History:  Diagnosis Date   Allergy    allergic rhinitis   Family history of breast cancer    Family history of uterine cancer    Gilbert's syndrome    with high bilirubin   Hypertension    Osteoporosis     Past Surgical History:  Procedure Laterality Date   TONSILLECTOMY AND ADENOIDECTOMY  1957    Current Outpatient Medications  Medication Sig Dispense Refill   Ascorbic Acid (VITAMIN C) 1000 MG tablet Take 1,000 mg by mouth daily.     hydrocortisone (ANUSOL-HC) 2.5 % rectal cream Place rectally 2 (two) times daily. Can use for up to 7 days at a time. 28 g 1   Multiple Vitamin (MULTIVITAMIN) capsule Take 1 capsule by mouth daily.     olmesartan (BENICAR) 20 MG  tablet TAKE 1 TABLET BY MOUTH DAILY (Patient taking differently: 10 mg.) 90 tablet 0   pyridOXINE (VITAMIN B-6) 50 MG tablet Take 50 mg by mouth daily.     tretinoin (RETIN-A) 0.05 % cream Uses seasonally     VITAMIN D PO Take 3,000 Units by mouth.     No current facility-administered medications for this visit.    Family History  Problem Relation Age of Onset   Heart disease Mother        heart attack   Hypertension Mother    Breast cancer Mother        ages 60 and 38   Hypertension Father    Heart disease Maternal Grandfather        MI   Hypertension Sister    Osteopenia Sister    Lung cancer Paternal Uncle        heavy smoker   Heart disease Maternal Aunt    Dementia Paternal Grandmother    Uterine cancer Cousin        dx in her 4s   Uterine cancer Other        MGM's maternal aunt    Review of Systems  All other systems reviewed and are negative.  Exam:   BP (!) 157/90 (BP Location: Left Arm, Patient Position: Sitting, Cuff Size: Normal)  Pulse 93    Ht 5' 3.5" (1.613 m) Comment: reported   Wt 114 lb (51.7 kg)    LMP 11/10/2002    BMI 19.88 kg/m   Height: 5' 3.5" (161.3 cm) (reported)  General appearance: alert, cooperative and appears stated age Breasts: normal appearance, no masses or tenderness Abdomen: soft, non-tender; bowel sounds normal; no masses,  no organomegaly Lymph nodes: Cervical, supraclavicular, and axillary nodes normal.  No abnormal inguinal nodes palpated Neurologic: Grossly normal  Pelvic: External genitalia:  no lesions              Urethra:  normal appearing urethra with no masses, tenderness or lesions              Bartholins and Skenes: normal                 Vagina: normal appearing vagina with atrophic changes and no discharge, no lesions              Cervix: no lesions              Pap taken: No. Bimanual Exam:  Uterus:  normal size, contour, position, consistency, mobility, non-tender              Adnexa: normal adnexa and no mass,  fullness, tenderness               Rectovaginal: Confirms               Anus:  normal sphincter tone, no lesions  Chaperone, Octaviano Batty, CMA, was present for exam.  Assessment/Plan: 1. Encntr for gyn exam (general) (routine) w/o abn findings - Pap 06/1999 - MMG 09/2021 - colonoscopy 07/2019 - BMD 01/2016 - lab work done with Dr. Glori Bickers - vaccines reviewed/updated  2. Age-related osteoporosis without fracture - BMD scheduled for 01/2022  3. Primary hypertension  4. Family history of uterine cancer  5. Family history of breast cancer - has undergone genetic testing that was negative - Tyrer Cusick model done 08/31/2020 and she is not high risk so 3D MMGs yearly recommended  6. Hemorrhoids, unspecified hemorrhoid type - does not need RF for hydrocortisone 2.5% cream at this time.  Last given 09/06/2021

## 2021-12-13 ENCOUNTER — Other Ambulatory Visit (HOSPITAL_BASED_OUTPATIENT_CLINIC_OR_DEPARTMENT_OTHER): Payer: Self-pay | Admitting: Obstetrics & Gynecology

## 2022-01-16 ENCOUNTER — Other Ambulatory Visit: Payer: Self-pay

## 2022-01-16 ENCOUNTER — Ambulatory Visit
Admission: RE | Admit: 2022-01-16 | Discharge: 2022-01-16 | Disposition: A | Discharge status: Home / Self Care | Payer: Medicare Other | Source: Ambulatory Visit | Attending: Family Medicine | Admitting: Family Medicine

## 2022-01-16 DIAGNOSIS — M810 Age-related osteoporosis without current pathological fracture: Secondary | ICD-10-CM | POA: Diagnosis not present

## 2022-01-16 DIAGNOSIS — Z780 Asymptomatic menopausal state: Secondary | ICD-10-CM | POA: Diagnosis not present

## 2022-01-16 DIAGNOSIS — E2839 Other primary ovarian failure: Secondary | ICD-10-CM

## 2022-01-17 ENCOUNTER — Encounter: Payer: Self-pay | Admitting: Family Medicine

## 2022-02-03 ENCOUNTER — Other Ambulatory Visit: Payer: Self-pay

## 2022-02-03 ENCOUNTER — Ambulatory Visit (INDEPENDENT_AMBULATORY_CARE_PROVIDER_SITE_OTHER): Payer: Medicare Other

## 2022-02-03 VITALS — Ht 63.5 in | Wt 112.0 lb

## 2022-02-03 DIAGNOSIS — Z0000 Encounter for general adult medical examination without abnormal findings: Secondary | ICD-10-CM

## 2022-02-03 NOTE — Progress Notes (Signed)
? ?Subjective:  ? Karen Rice is a 71 y.o. female who presents for Medicare Annual (Subsequent) preventive examination. ?Virtual Visit via Telephone Note ? ?I connected with  Karen Rice on 02/03/22 at  9:00 AM EDT by telephone and verified that I am speaking with the correct person using two identifiers. ? ?Location: ?Patient: HOME ?Provider: [facility] ?Persons participating in the virtual visit: patient/Nurse Health Advisor ?  ?I discussed the limitations, risks, security and privacy concerns of performing an evaluation and management service by telephone and the availability of in person appointments. The patient expressed understanding and agreed to proceed. ? ?Interactive audio and video telecommunications were attempted between this nurse and patient, however failed, due to patient having technical difficulties OR patient did not have access to video capability.  We continued and completed visit with audio only. ? ?Some vital signs Hanna be absent or patient reported.  ? ?Chriss Driver, LPN ? ?Review of Systems    ? ?Cardiac Risk Factors include: advanced age (>39mn, >>6women) ? ?   ?Objective:  ?  ?Today's Vitals  ? 02/03/22 0902  ?Weight: 112 lb (50.8 kg)  ?Height: 5' 3.5" (1.613 m)  ? ?Body mass index is 19.53 kg/m?. ? ? ?  02/03/2022  ?  9:13 AM 02/01/2021  ?  9:01 AM 01/31/2020  ? 10:34 AM 10/29/2018  ?  7:34 PM  ?Advanced Directives  ?Does Patient Have a Medical Advance Directive? Yes Yes Yes No  ?Type of [place]amed[place]of [place] will [place] will [place] Lake[place] will   ?Copy of [place]? No - copy requested No - copy requested No - copy requested   ?Would patient like information on creating a medical advance directive?    No - Patient declined  ? ? ?Current Medications (verified) ?Outpatient Encounter Medications as of 02/03/2022  ?Medication Sig  ? Ascorbic Acid (VITAMIN C) 1000 MG tablet Take 1,000  mg by mouth daily.  ? hydrocortisone (ANUSOL-HC) 2.5 % rectal cream Apply topically 2 (two) times daily. Do not use for more than 7 days.  ? Multiple Vitamin (MULTIVITAMIN) capsule Take 1 capsule by mouth daily.  ? olmesartan (BENICAR) 20 MG tablet TAKE 1 TABLET BY MOUTH DAILY (Patient taking differently: 10 mg.)  ? tretinoin (RETIN-A) 0.05 % cream Uses seasonally  ? vitamin B-12 (CYANOCOBALAMIN) 500 MCG tablet Take 500 mcg by mouth daily.  ? VITAMIN D PO Take 3,000 Units by mouth.  ? [DISCONTINUED] pyridOXINE (VITAMIN B-6) 50 MG tablet Take 50 mg by mouth daily.  ? ?No facility-administered encounter medications on file as of 02/03/2022.  ? ? ?Allergies (verified) ?Boniva [ibandronic acid], Lisinopril, and Norvasc [amlodipine besylate]  ? ?History: ?Past Medical History:  ?Diagnosis Date  ? Allergy   ? allergic rhinitis  ? Family history of breast cancer   ? Family history of uterine cancer   ? 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 Hypertension   ? Osteoporosis   ? ?Past Surgical History:  ?Procedure Laterality Date  ? TONSILLECTOMY AND ADENOIDECTOMY  1957  ? ?Family History  ?Problem Relation Age of Onset  ? Heart disease Mother   ?     heart attack  ? Hypertension Mother   ? Breast cancer Mother   ?     ages 433and 527 ? Hypertension Father   ? Heart disease Maternal Grandfather   ?  MI  ? Hypertension Sister   ? Osteopenia Sister   ? Lung cancer Paternal Uncle   ?     heavy smoker  ? Heart disease Maternal Aunt   ? Dementia Paternal Grandmother   ? Uterine cancer Cousin   ?     dx in her 58s  ? Uterine cancer Other   ?     MGM's maternal aunt  ? ?Social History  ? ?Socioeconomic History  ? Marital status: Divorced  ?  Spouse name: Not on file  ? Number of children: Not on file  ? Years of education: Not on file  ? Highest education level: Not on file  ?Occupational History  ? Not on file  ?Tobacco Use  ? Smoking status: Former  ?  Types: Cigarettes  ?  Quit date: 11/10/2000  ?  Years since quitting: 21.2   ? Smokeless tobacco: Never  ?Vaping Use  ? Vaping Use: Never used  ?Substance and Sexual Activity  ? Alcohol use: Yes  ?  Alcohol/week: 4.0 standard drinks  ?  Types: 4 Glasses of wine per week  ? Drug use: No  ? Sexual activity: Not Currently  ?  Partners: Female  ?  Birth control/protection: Post-menopausal  ?Other Topics Concern  ? Not on file  ?Social History Narrative  ? Not on file  ? ?Social Determinants of Health  ? ?Financial Resource Strain: Low Risk   ? Difficulty of Paying Living Expenses: Not hard at all  ?Food Insecurity: No Food Insecurity  ? Worried About Charity fundraiser in the Last Year: Never true  ? Ran Out of Food in the Last Year: Never true  ?Transportation Needs: No Transportation Needs  ? Lack of Transportation (Medical): No  ? Lack of Transportation (Non-Medical): No  ?Physical Activity: Sufficiently Active  ? Days of Exercise per Week: 5 days  ? Minutes of Exercise per Session: 60 min  ?Stress: No Stress Concern Present  ? Feeling of Stress : Not at all  ?Social Connections: Moderately Integrated  ? Frequency of Communication with Friends and Family: More than three times a week  ? Frequency of Social Gatherings with Friends and Family: More than three times a week  ? Attends Religious Services: More than 4 times per year  ? Active Member of Clubs or Organizations: Yes  ? Attends Archivist Meetings: More than 4 times per year  ? Marital Status: Divorced  ? ? ?Tobacco Counseling ?Counseling given: Not Answered ? ? ?Clinical Intake: ? ?Pre-visit preparation completed: Yes ? ?Pain : No/denies pain ? ?  ? ?BMI - recorded: 19.53 ?Nutritional Status: BMI of 19-24  Normal ?Nutritional Risks: None ?Diabetes: No ? ?How often do you need to have someone help you when you read instructions, pamphlets, or other written materials from your doctor or pharmacy?: 1 - Never ? ?Diabetic?NO ? ?Interpreter Needed?: No ? ?Information entered by :: mj Miamor Ayler, lpn ? ? ?Activities of Daily  Living ? ?  02/03/2022  ?  9:17 AM  ?In your present state of health, do you have any difficulty performing the following activities:  ?Hearing? 0  ?Vision? 0  ?Difficulty concentrating or making decisions? 0  ?Walking or climbing stairs? 0  ?Dressing or bathing? 0  ?Doing errands, shopping? 0  ?Preparing Food and eating ? N  ?Using the Toilet? N  ?In the past six months, have you accidently leaked urine? N  ?Do you have problems with loss of bowel control?  N  ?Managing your Medications? N  ?Managing your Finances? N  ?Housekeeping or managing your Housekeeping? N  ? ? ?Patient Care Team: ?Tower, Wynelle Fanny, MD as PCP - General ? ?Indicate any recent Medical Services you David have received from other than [facility] providers in the past year (date Gomer be approximate). ? ?   ?Assessment:  ? This is a routine wellness examination for Karen Rice. ? ?Hearing/Vision screen ?Hearing Screening - Comments:: No hearing issues.  ?Vision Screening - Comments:: Glasses. Dr. Morene Rankins 2022. ? ?Dietary issues and exercise activities discussed: ?Current Exercise Habits: Home exercise routine, Type of exercise: walking, Time (Minutes): 60, Frequency (Times/Week): 5, Weekly Exercise (Minutes/Week): 300, Intensity: Mild, Exercise limited by: cardiac condition(s) ? ? Goals Addressed   ? ?  ?  ?  ?  ? This Visit's Progress  ?  Patient Stated   On track  ?  01/31/2020, I will continue to walk my dogs everyday for 2 1/2 miles.  ?  ?  Patient Stated   On track  ?  02/01/2021, I will continue to walk 2-4 miles every day.  ?  ? ?  ? ?Depression Screen ? ?  02/03/2022  ?  9:07 AM 11/15/2021  ?  8:53 AM 02/01/2021  ?  9:08 AM 01/31/2020  ? 10:35 AM 12/02/2018  ? 12:27 PM 10/09/2017  ? 11:29 AM 10/06/2016  ?  9:29 AM  ?PHQ 2/9 Scores  ?PHQ - 2 Score 0 1 0 0 0 0 0  ?PHQ- 9 Score   0 0     ?  ?Fall Risk ? ?  02/03/2022  ?  9:14 AM 02/01/2021  ?  9:02 AM 01/31/2020  ? 10:35 AM 12/02/2018  ? 12:27 PM 09/24/2018  ?  5:38 PM  ?Fall Risk   ?Falls in the past year? 0 0 0 0 0   ?Comment     Emmi Telephone Survey: data to providers prior to load  ?Number falls in past yr: 0 0 0 0   ?Injury with Fall? 0 0 0    ?Risk for fall due to : No Fall Risks Medication side effect Medication side

## 2022-02-03 NOTE — Patient Instructions (Signed)
Karen Rice , ?Thank you for taking time to come for your Medicare Wellness Visit. I appreciate your ongoing commitment to your health goals. Please review the following plan we discussed and let me know if I can assist you in the future.  ? ?Screening recommendations/referrals: ?Colonoscopy: Done 07/14/2019 Repeat in 10 years ? ?Mammogram: Done 09/23/2021 Repeat annually ? ?Bone Density: Done 01/16/2022. Repeat every 2 years ? ?Recommended yearly ophthalmology/optometry visit for glaucoma screening and checkup ?Recommended yearly dental visit for hygiene and checkup ? ?Vaccinations: ?Influenza vaccine: Done 08/20/2021. Repeat annually ? ?Pneumococcal vaccine: Done 11/12/2016, 09/15/2019 ?Tdap vaccine: Done 09/15/2013 Repeat in 10 years ? ?Shingles vaccine: Done 03/23/2013. Discussed Shingrix.   ?Covid-19:Done 09/29/2020, 02/14/2020 and 01/02/2020 ? ?Advanced directives: Please bring a copy of your health care power of attorney and living will to the office to be added to your chart at your convenience. ? ? ?Conditions/risks identified: KEEP UP THE GOOD WORK!! ? ?Next appointment: Follow up in one year for your annual wellness visit 2024. ? ? ?Preventive Care 71 Years and Older, Female ?Preventive care refers to lifestyle choices and visits with your health care provider that can promote health and wellness. ?What does preventive care include? ?A yearly physical exam. This is also called an annual well check. ?Dental exams once or twice a year. ?Routine eye exams. Ask your health care provider how often you should have your eyes checked. ?Personal lifestyle choices, including: ?Daily care of your teeth and gums. ?Regular physical activity. ?Eating a healthy diet. ?Avoiding tobacco and drug use. ?Limiting alcohol use. ?Practicing safe sex. ?Taking low-dose aspirin every day. ?Taking vitamin and mineral supplements as recommended by your health care provider. ?What happens during an annual well check? ?The services and screenings  done by your health care provider during your annual well check will depend on your age, overall health, lifestyle risk factors, and family history of disease. ?Counseling  ?Your health care provider Engebretson ask you questions about your: ?Alcohol use. ?Tobacco use. ?Drug use. ?Emotional well-being. ?Home and relationship well-being. ?Sexual activity. ?Eating habits. ?History of falls. ?Memory and ability to understand (cognition). ?Work and work Statistician. ?Reproductive health. ?Screening  ?You Wiggs have the following tests or measurements: ?Height, weight, and BMI. ?Blood pressure. ?Lipid and cholesterol levels. These Younker be checked every 5 years, or more frequently if you are over 71 years old. ?Skin check. ?Lung cancer screening. You [place] have this screening every year starting at age 71 if you have a 30-pack-year history of smoking and currently smoke or have quit within the past 15 years. ?Fecal occult blood test (FOBT) of the stool. You Solano have this test every year starting at age 71. ?Flexible sigmoidoscopy or colonoscopy. You Ciriello have a sigmoidoscopy every 5 years or a colonoscopy every 10 years starting at age 71. ?Hepatitis C blood test. ?Hepatitis B blood test. ?Sexually transmitted disease (STD) testing. ?Diabetes screening. This is done by checking your blood sugar (glucose) after you have not eaten for a while (fasting). You Duncanson have this done every 1-3 years. ?Bone density scan. This is done to screen for osteoporosis. You Rucci have this done starting at age 71. ?Mammogram. This Struble be done every 1-2 years. Talk to your health care provider about how often you should have regular mammograms. ?Talk with your health care provider about your test results, treatment options, and if necessary, the need for more tests. ?Vaccines  ?Your health care provider Wetherby recommend certain vaccines, such as: ?Influenza vaccine. This  is recommended every year. ?Tetanus, diphtheria, and acellular pertussis (Tdap, Td)  vaccine. You Falconi need a Td booster every 10 years. ?Zoster vaccine. You Moquin need this after age 71. ?Pneumococcal 13-valent conjugate (PCV13) vaccine. One dose is recommended after age 71. ?Pneumococcal polysaccharide (PPSV23) vaccine. One dose is recommended after age 71. ?Talk to your health care provider about which screenings and vaccines you need and how often you need them. ?This information is not intended to replace advice given to you by your health care provider. Make sure you discuss any questions you have with your health care provider. ?Document Released: 11/23/2015 Document Revised: 07/16/2016 Document Reviewed: 08/28/2015 ?Elsevier Interactive Patient Education ? 2017 [place]. ? ?Fall Prevention in the Home ?Falls can cause injuries. They can happen to people of all ages. There are many things you can do to make your home safe and to help prevent falls. ?What can I do on the outside of my home? ?Regularly fix the edges of walkways and driveways and fix any cracks. ?Remove anything that might make you trip as you walk through a door, such as a raised step or threshold. ?Trim any bushes or trees on the path to your home. ?Use bright outdoor lighting. ?Clear any walking paths of anything that might make someone trip, such as rocks or tools. ?Regularly check to see if handrails are loose or broken. Make sure that both sides of any steps have handrails. ?Any raised decks and porches should have guardrails on the edges. ?Have any leaves, snow, or ice cleared regularly. ?Use sand or salt on walking paths during winter. ?Clean up any spills in your garage right away. This includes oil or grease spills. ?What can I do in the bathroom? ?Use night lights. ?Install grab bars by the toilet and in the tub and shower. Do not use towel bars as grab bars. ?Use non-skid mats or decals in the tub or shower. ?If you need to sit down in the shower, use a plastic, non-slip stool. ?Keep the floor dry. Clean up any  water that spills on the floor as soon as it happens. ?Remove soap buildup in the tub or shower regularly. ?Attach bath mats securely with double-sided non-slip rug tape. ?Do not have throw rugs and other things on the floor that can make you trip. ?What can I do in the bedroom? ?Use night lights. ?Make sure that you have a light by your bed that is easy to reach. ?Do not use any sheets or blankets that are too big for your bed. They should not hang down onto the floor. ?Have a firm chair that has side arms. You can use this for support while you get dressed. ?Do not have throw rugs and other things on the floor that can make you trip. ?What can I do in the kitchen? ?Clean up any spills right away. ?Avoid walking on wet floors. ?Keep items that you use a lot in easy-to-reach places. ?If you need to reach something above you, use a strong step stool that has a grab bar. ?Keep electrical cords out of the way. ?Do not use floor polish or wax that makes floors slippery. If you must use wax, use non-skid floor wax. ?Do not have throw rugs and other things on the floor that can make you trip. ?What can I do with my stairs? ?Do not leave any items on the stairs. ?Make sure that there are handrails on both sides of the stairs and use them. Fix handrails that  are broken or loose. Make sure that handrails are as long as the stairways. ?Check any carpeting to make sure that it is firmly attached to the stairs. Fix any carpet that is loose or worn. ?Avoid having throw rugs at the top or bottom of the stairs. If you do have throw rugs, attach them to the floor with carpet tape. ?Make sure that you have a light switch at the top of the stairs and the bottom of the stairs. If you do not have them, ask someone to add them for you. ?What else can I do to help prevent falls? ?Wear shoes that: ?Do not have high heels. ?Have rubber bottoms. ?Are comfortable and fit you well. ?Are closed at the toe. Do not wear sandals. ?If you use a  stepladder: ?Make sure that it is fully opened. Do not climb a closed stepladder. ?Make sure that both sides of the stepladder are locked into place. ?Ask someone to hold it for you, if possible. ?Clearly mark and ma

## 2022-02-05 DIAGNOSIS — Z20828 Contact with and (suspected) exposure to other viral communicable diseases: Secondary | ICD-10-CM | POA: Diagnosis not present

## 2022-02-07 ENCOUNTER — Other Ambulatory Visit (INDEPENDENT_AMBULATORY_CARE_PROVIDER_SITE_OTHER): Payer: Medicare Other

## 2022-02-07 DIAGNOSIS — I10 Essential (primary) hypertension: Secondary | ICD-10-CM | POA: Diagnosis not present

## 2022-02-07 DIAGNOSIS — M818 Other osteoporosis without current pathological fracture: Secondary | ICD-10-CM | POA: Diagnosis not present

## 2022-02-07 DIAGNOSIS — R5382 Chronic fatigue, unspecified: Secondary | ICD-10-CM | POA: Diagnosis not present

## 2022-02-07 DIAGNOSIS — Z0000 Encounter for general adult medical examination without abnormal findings: Secondary | ICD-10-CM | POA: Diagnosis not present

## 2022-02-07 LAB — CBC WITH DIFFERENTIAL/PLATELET
Basophils Absolute: 0.1 10*3/uL (ref 0.0–0.1)
Basophils Relative: 0.9 % (ref 0.0–3.0)
Eosinophils Absolute: 0.1 10*3/uL (ref 0.0–0.7)
Eosinophils Relative: 1.5 % (ref 0.0–5.0)
HCT: 39.2 % (ref 36.0–46.0)
Hemoglobin: 13.4 g/dL (ref 12.0–15.0)
Lymphocytes Relative: 25.5 % (ref 12.0–46.0)
Lymphs Abs: 1.7 10*3/uL (ref 0.7–4.0)
MCHC: 34.1 g/dL (ref 30.0–36.0)
MCV: 92.1 fl (ref 78.0–100.0)
Monocytes Absolute: 0.6 10*3/uL (ref 0.1–1.0)
Monocytes Relative: 8.5 % (ref 3.0–12.0)
Neutro Abs: 4.2 10*3/uL (ref 1.4–7.7)
Neutrophils Relative %: 63.6 % (ref 43.0–77.0)
Platelets: 276 10*3/uL (ref 150.0–400.0)
RBC: 4.25 Mil/uL (ref 3.87–5.11)
RDW: 13.6 % (ref 11.5–15.5)
WBC: 6.5 10*3/uL (ref 4.0–10.5)

## 2022-02-07 LAB — COMPREHENSIVE METABOLIC PANEL
ALT: 17 U/L (ref 0–35)
AST: 22 U/L (ref 0–37)
Albumin: 4.7 g/dL (ref 3.5–5.2)
Alkaline Phosphatase: 68 U/L (ref 39–117)
BUN: 18 mg/dL (ref 6–23)
CO2: 31 mEq/L (ref 19–32)
Calcium: 10.3 mg/dL (ref 8.4–10.5)
Chloride: 98 mEq/L (ref 96–112)
Creatinine, Ser: 0.64 mg/dL (ref 0.40–1.20)
GFR: 89.47 mL/min (ref >60.00)
Glucose, Bld: 90 mg/dL (ref 70–99)
Potassium: 4.7 mEq/L (ref 3.5–5.1)
Sodium: 136 mEq/L (ref 135–145)
Total Bilirubin: 1.7 mg/dL — ABNORMAL HIGH (ref 0.2–1.2)
Total Protein: 6.9 g/dL (ref 6.0–8.3)

## 2022-02-07 LAB — LIPID PANEL
Cholesterol: 180 mg/dL (ref 0–200)
HDL: 90.2 mg/dL (ref >39.00)
LDL Cholesterol: 80 mg/dL (ref 0–99)
NonHDL: 90.28
Total CHOL/HDL Ratio: 2
Triglycerides: 51 mg/dL (ref 0.0–149.0)
VLDL: 10.2 mg/dL (ref 0.0–40.0)

## 2022-02-07 LAB — TSH: TSH: 2.35 u[IU]/mL (ref 0.35–5.50)

## 2022-02-07 LAB — VITAMIN D 25 HYDROXY (VIT D DEFICIENCY, FRACTURES): VITD: 55.38 ng/mL (ref 30.00–100.00)

## 2022-02-12 ENCOUNTER — Telehealth: Payer: Self-pay | Admitting: Family Medicine

## 2022-02-12 DIAGNOSIS — I10 Essential (primary) hypertension: Secondary | ICD-10-CM

## 2022-02-12 NOTE — Telephone Encounter (Signed)
-----   Message from Velna Hatchet, RT sent at 01/29/2022  4:44 PM EDT ----- ?Regarding: Lab order for Thursday, 02/13/22 appt ?Patient is scheduled for cpx, please order future labs.  Thanks, Anda Kraft  ? ?

## 2022-02-13 ENCOUNTER — Other Ambulatory Visit: Payer: Medicare Other

## 2022-02-20 ENCOUNTER — Encounter: Payer: Self-pay | Admitting: Family Medicine

## 2022-02-20 ENCOUNTER — Ambulatory Visit (INDEPENDENT_AMBULATORY_CARE_PROVIDER_SITE_OTHER): Payer: Medicare Other | Admitting: Family Medicine

## 2022-02-20 VITALS — BP 140/80 | HR 99 | Temp 97.7°F | Ht 63.0 in | Wt 113.1 lb

## 2022-02-20 DIAGNOSIS — Z1211 Encounter for screening for malignant neoplasm of colon: Secondary | ICD-10-CM

## 2022-02-20 DIAGNOSIS — I10 Essential (primary) hypertension: Secondary | ICD-10-CM

## 2022-02-20 DIAGNOSIS — M818 Other osteoporosis without current pathological fracture: Secondary | ICD-10-CM | POA: Diagnosis not present

## 2022-02-20 MED ORDER — HYDROCORTISONE (PERIANAL) 2.5 % EX CREA: TOPICAL_CREAM | Freq: Two times a day (BID) | CUTANEOUS | 1 refills | Status: DC

## 2022-02-20 NOTE — Patient Instructions (Addendum)
If you are interested in the new shingles vaccine (Shingrix) - call your local pharmacy to check on coverage and availability  ?If affordable, get on a wait list at your pharmacy to get the vaccine. ? ? ?Take a look at info on Prolia and check on coverage  ?Let us know what you think  ? ?Keep taking care of yourself  ? ? ?

## 2022-02-20 NOTE — Progress Notes (Signed)
? ?Subjective:  ? ? Patient ID: Karen Rice, female    DOB: [birth year], 71 y.o.   MRN: [id number] ? ?HPI ?Pt presents for annual f/u of chronic medical problems  ? ?Wt Readings from Last 3 Encounters:  ?02/20/22 113 lb 2 oz (51.3 kg)  ?02/03/22 112 lb (50.8 kg)  ?11/15/21 114 lb (51.7 kg)  ? ?20.04 kg/m? ?Doing great  ?Feels good  ?Taking care of herself  ? ?Colonoscopy utd 2020 ?Mammogram 09/2021 utd ?Self breast exams : no lumps  ? ?Zoster status : interested in shingrix vaccine  ? ? ?Dexa 01/2022  OP  ?Falls : none  ?Fractures:none  ?Supplements  2000 iu D plus mvi  ?Yogurt and cheese and broccli  ? ?Exercise : 4-6 mi walking daily to twice daily  ?Some push ups and planks also  ?Hang from pull up bars  ?Hip and back exercise  ? ?Uses caution with lifting  ? ?She signed up for [facility] in case she needs it later  ? ?She did take fosamax in the past  ?Boniva- intol GI  ? ? ? ?Brought copy of adv directive  ? ?Went to the cardiologist  ?Had a consult - good report  ? ? ?HTN ? ?BP Readings from Last 3 Encounters:  ?02/20/22 (!) 142/86  ?11/15/21 (!) 157/90  ?09/19/21 118/68  ? ?Pulse Readings from Last 3 Encounters:  ?02/20/22 99  ?11/15/21 93  ?09/19/21 78  ? ?Benicar 10 mg daily ? ?Lab Results  ?Component Value Date  ? CREATININE 0.64 02/07/2022  ? BUN 18 02/07/2022  ? NA 136 02/07/2022  ? K 4.7 02/07/2022  ? CL 98 02/07/2022  ? CO2 31 02/07/2022  ? ? ?Lab Results  ?Component Value Date  ? CHOL 180 02/07/2022  ? CHOL 200 01/31/2021  ? CHOL 191 01/27/2020  ? ?Lab Results  ?Component Value Date  ? HDL 90.20 02/07/2022  ? HDL 92.70 01/31/2021  ? HDL 95.00 01/27/2020  ? ?Lab Results  ?Component Value Date  ? Nordic 80 02/07/2022  ? [place] 96 01/31/2021  ? [place] 86 01/27/2020  ? ?Lab Results  ?Component Value Date  ? TRIG 51.0 02/07/2022  ? TRIG 57.0 01/31/2021  ? TRIG 50.0 01/27/2020  ? ?Lab Results  ?Component Value Date  ? CHOLHDL 2 02/07/2022  ? CHOLHDL 2 01/31/2021  ? CHOLHDL 2 01/27/2020  ? ?Lab Results   ?Component Value Date  ? LDLDIRECT 94.6 07/19/2012  ? LDLDIRECT 89.7 12/26/2008  ? ? ?Patient Active Problem List  ? Diagnosis Date Noted  ? Family history of sudden cardiac death 07-Lamke-2022  ? Grief reaction 09/07/2020  ? Medicare annual wellness visit, initial 12/02/2018  ? Colon cancer screening 12/02/2018  ? Genetic testing 08/26/2018  ? Family history of breast cancer   ? Family history of uterine cancer   ? Hypertension 03/03/2011  ? Routine general medical examination at a health care facility 03/03/2011  ? Age-related osteoporosis without fracture 07/21/2007  ? ?Past Medical History:  ?Diagnosis Date  ? Allergy   ? allergic rhinitis  ? Family history of breast cancer   ? Family history of uterine cancer   ? 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 Hypertension   ? Osteoporosis   ? ?Past Surgical History:  ?Procedure Laterality Date  ? TONSILLECTOMY AND ADENOIDECTOMY  11/11/1955  ? ?Social History  ? ?Tobacco Use  ? Smoking status: Former  ?  Types: Cigarettes  ?  Quit date: 11/10/2000  ?  Years since quitting: 21.3  ? Smokeless tobacco: Never  ?Vaping Use  ? Vaping Use: Never used  ?Substance Use Topics  ? Alcohol use: Yes  ?  Alcohol/week: 5.0 standard drinks  ?  Types: 5 Glasses of wine per week  ?  Comment: Wine  ? Drug use: No  ? ?Family History  ?Problem Relation Age of Onset  ? Heart disease Mother   ?     heart attack  ? Hypertension Mother   ? Breast cancer Mother   ?     ages 56 and 53  ? Cancer Mother   ? Hypertension Father   ? Alcohol abuse Father   ? Heart disease Maternal Grandfather   ?     MI  ? Hypertension Sister   ? Osteopenia Sister   ? Lung cancer Paternal Uncle   ?     heavy smoker  ? Heart disease Maternal Aunt   ? Dementia Paternal Grandmother   ? Uterine cancer Cousin   ?     dx in her 47s  ? Uterine cancer Other   ?     MGM's maternal aunt  ? ?Allergies  ?Allergen Reactions  ? Boniva [Ibandronic Acid] Nausea Only  ? Lisinopril Other (See Comments)  ?  Severe fatigue  ? Norvasc  [Amlodipine Besylate]   ?  Fatigue/ depression  ? ?Current Outpatient Medications on File Prior to Visit  ?Medication Sig Dispense Refill  ? Ascorbic Acid (VITAMIN C) 1000 MG tablet Take 1,000 mg by mouth daily.    ? Multiple Vitamin (MULTIVITAMIN) capsule Take 1 capsule by mouth daily.    ? olmesartan (BENICAR) 20 MG tablet TAKE 1 TABLET BY MOUTH DAILY (Patient taking differently: 10 mg.) 90 tablet 0  ? tretinoin (RETIN-A) 0.05 % cream Uses seasonally    ? vitamin B-12 (CYANOCOBALAMIN) 500 MCG tablet Take 500 mcg by mouth daily.    ? VITAMIN D PO Take 3,000 Units by mouth.    ? ?No current facility-administered medications on file prior to visit.  ?  ? ?Review of Systems  ?Constitutional:  Negative for activity change, appetite change, fatigue, fever and unexpected weight change.  ?HENT:  Negative for congestion, ear pain, rhinorrhea, sinus pressure and sore throat.   ?Eyes:  Negative for pain, redness and visual disturbance.  ?Respiratory:  Negative for cough, shortness of breath and wheezing.   ?Cardiovascular:  Negative for chest pain and palpitations.  ?Gastrointestinal:  Negative for abdominal pain, blood in stool, constipation and diarrhea.  ?Endocrine: Negative for polydipsia and polyuria.  ?Genitourinary:  Negative for dysuria, frequency and urgency.  ?Musculoskeletal:  Negative for arthralgias, back pain and myalgias.  ?Skin:  Negative for pallor and rash.  ?Allergic/Immunologic: Negative for environmental allergies.  ?Neurological:  Negative for dizziness, syncope and headaches.  ?Hematological:  Negative for adenopathy. Does not bruise/bleed easily.  ?Psychiatric/Behavioral:  Negative for decreased concentration and dysphoric mood. The patient is not nervous/anxious.   ? ?   ?Objective:  ? Physical Exam ?Constitutional:   ?   General: She is not in acute distress. ?   Appearance: Normal appearance. She is well-developed and normal weight. She is not ill-appearing or diaphoretic.  ?HENT:  ?   Head:  Normocephalic and atraumatic.  ?   Right Ear: Tympanic membrane, ear canal and external ear normal.  ?   Left Ear: Tympanic membrane, ear canal and external ear normal.  ?   Nose: Nose normal. No congestion.  ?  Mouth/Throat:  ?   Mouth: Mucous membranes are moist.  ?   Pharynx: Oropharynx is clear. No posterior oropharyngeal erythema.  ?Eyes:  ?   General: No scleral icterus. ?   Extraocular Movements: Extraocular movements intact.  ?   Conjunctiva/sclera: Conjunctivae normal.  ?   Pupils: Pupils are equal, round, and reactive to light.  ?Neck:  ?   Thyroid: No thyromegaly.  ?   Vascular: No carotid bruit or JVD.  ?Cardiovascular:  ?   Rate and Rhythm: Normal rate and regular rhythm.  ?   Pulses: Normal pulses.  ?   Heart sounds: Normal heart sounds.  ?  No gallop.  ?Pulmonary:  ?   Effort: Pulmonary effort is normal. No respiratory distress.  ?   Breath sounds: Normal breath sounds. No wheezing.  ?   Comments: Good air exch ?Chest:  ?   Chest wall: No tenderness.  ?Abdominal:  ?   General: Bowel sounds are normal. There is no distension or abdominal bruit.  ?   Palpations: Abdomen is soft. There is no mass.  ?   Tenderness: There is no abdominal tenderness.  ?   Hernia: No hernia is present.  ?Genitourinary: ?   Comments: Breast exam: No mass, nodules, thickening, tenderness, bulging, retraction, inflamation, nipple discharge or skin changes noted.  No axillary or clavicular LA.     ?Musculoskeletal:     ?   General: No tenderness. Normal range of motion.  ?   Cervical back: Normal range of motion and neck supple. No rigidity. No muscular tenderness.  ?   Right lower leg: No edema.  ?   Left lower leg: No edema.  ?   Comments: No kyphosis   ?Lymphadenopathy:  ?   Cervical: No cervical adenopathy.  ?Skin: ?   General: Skin is warm and dry.  ?   Coloration: Skin is not pale.  ?   Findings: No erythema or rash.  ?   Comments: Mildly tanned ?Solar lentigines diffusely ?  ?Neurological:  ?   Mental Status: She is  alert. Mental status is at baseline.  ?   Cranial Nerves: No cranial nerve deficit.  ?   Motor: No abnormal muscle tone.  ?   Coordination: Coordination normal.  ?   Gait: Gait normal.  ?   Deep Tendon Reflexes: Reflex

## 2022-02-23 NOTE — Assessment & Plan Note (Signed)
dexa reviewed from 01/2022 ?No falls or fractures ?Taking 2000 iu D3 daily plus a mvi and high calcium diet  ?Excellent weight bearing exercise  ? ?Took fosamax in the past and was intolerant to boniva ?Would rather avoid bisphosphonate ?Interested in learning about prolia/handout given and Sparkman check coverage and let us know ?

## 2022-02-23 NOTE — Assessment & Plan Note (Signed)
bp in fair control at this time  ?BP Readings from Last 1 Encounters:  ?02/20/22 140/80  ? ?No changes needed ?Most recent labs reviewed  ?Disc lifstyle change with low sodium diet and exercise  ?Plan to continue benicar 10 mg daily ?

## 2022-02-23 NOTE — Assessment & Plan Note (Signed)
Colonoscopy utd 2020 ?

## 2022-03-10 ENCOUNTER — Encounter: Payer: Self-pay | Admitting: Family Medicine

## 2022-03-11 ENCOUNTER — Encounter: Payer: Self-pay | Admitting: Family Medicine

## 2022-03-11 MED ORDER — ACETAZOLAMIDE 125 MG PO TABS: ORAL_TABLET | ORAL | 0 refills | Status: DC

## 2022-03-26 DIAGNOSIS — H52203 Unspecified astigmatism, bilateral: Secondary | ICD-10-CM | POA: Diagnosis not present

## 2022-03-26 DIAGNOSIS — H04123 Dry eye syndrome of bilateral lacrimal glands: Secondary | ICD-10-CM | POA: Diagnosis not present

## 2022-03-26 DIAGNOSIS — H2513 Age-related nuclear cataract, bilateral: Secondary | ICD-10-CM | POA: Diagnosis not present

## 2022-03-26 DIAGNOSIS — H25013 Cortical age-related cataract, bilateral: Secondary | ICD-10-CM | POA: Diagnosis not present

## 2022-05-09 ENCOUNTER — Encounter: Payer: Self-pay | Admitting: Gastroenterology

## 2022-05-09 ENCOUNTER — Ambulatory Visit (INDEPENDENT_AMBULATORY_CARE_PROVIDER_SITE_OTHER): Payer: Medicare Other | Admitting: Gastroenterology

## 2022-05-09 VITALS — BP 148/86 | HR 70 | Ht 63.5 in | Wt 113.0 lb

## 2022-05-09 DIAGNOSIS — K625 Hemorrhage of anus and rectum: Secondary | ICD-10-CM | POA: Diagnosis not present

## 2022-05-09 DIAGNOSIS — K602 Anal fissure, unspecified: Secondary | ICD-10-CM | POA: Diagnosis not present

## 2022-05-09 MED ORDER — AMBULATORY NON FORMULARY MEDICATION
1 refills | Status: DC
Start: 2022-05-09 — End: 2022-08-15

## 2022-05-09 NOTE — Patient Instructions (Signed)
We have sent a prescription for nitroglycerin 0.125% gel to [facility]. You should apply a pea size amount to your rectum three times daily x 6-8 weeks.  [facility]'s information is below: Address: [address], [place], [place] [address]  [phone]  *Please DO NOT go directly from our office to pick up this medication! Give the pharmacy 1 day to process the prescription as this is compounded and takes time to make.   Take Benefiber 1 tablespoon three times a day   Increase water to 8 cups per day  If you are age 76 or older, your body mass index should be between 23-30. Your Body mass index is 19.7 kg/m. If this is out of the aforementioned range listed, please consider follow up with your Primary Care Provider.  If you are age 50 or younger, your body mass index should be between 19-25. Your Body mass index is 19.7 kg/m. If this is out of the aformentioned range listed, please consider follow up with your Primary Care Provider.   ________________________________________________________  The [facility] providers would like to encourage you to use [facility] to communicate with providers for non-urgent requests or questions.  Due to long hold times on the telephone, sending your provider a message by [facility] be a faster and more efficient way to get a response.  Please allow 48 business hours for a response.  Please remember that this is for non-urgent requests.  _______________________________________________________   I appreciate the  opportunity to care for you  Thank You   Harl Bowie , MD

## 2022-05-09 NOTE — Progress Notes (Unsigned)
Karen Rice    [id number]    [birth year]  Primary Care Physician:Tower, Wynelle Fanny, MD  Referring Physician: Tower, Wynelle Fanny, MD [place],  [place] [address]   Chief complaint: Hemorrhoids  HPI:  71 year old very pleasant female here with complaints of rectal irritation and stinging sensation inside her rectum on and off for past few weeks.  She has occasional bright red blood per rectum after a bowel movement.  Colonoscopy July 14, 2019: - Diverticulosis in the sigmoid colon and in the descending colon. - Non-bleeding internal hemorrhoids.   Colonoscopy September 24, 2006 by Dr. Olevia Perches normal exam, was recommended 10-year recall.     Outpatient Encounter Medications as of 05/09/2022  Medication Sig   Ascorbic Acid (VITAMIN C) 1000 MG tablet Take 1,000 mg by mouth daily.   hydrocortisone (ANUSOL-HC) 2.5 % rectal cream Apply topically 2 (two) times daily. Do not use for more than 7 days.   Multiple Vitamin (MULTIVITAMIN) capsule Take 1 capsule by mouth daily.   olmesartan (BENICAR) 20 MG tablet TAKE 1 TABLET BY MOUTH DAILY (Patient taking differently: 10 mg.)   tretinoin (RETIN-A) 0.05 % cream Uses seasonally   vitamin B-12 (CYANOCOBALAMIN) 500 MCG tablet Take 500 mcg by mouth daily.   VITAMIN D PO Take 3,000 Units by mouth.   [DISCONTINUED] acetaZOLAMIDE (DIAMOX) 125 MG tablet Take 1 pill by mouth twice daily for 1 day before climb through the duration of climb   No facility-administered encounter medications on file as of 05/09/2022.    Allergies as of 05/09/2022 - Review Complete 05/09/2022  Allergen Reaction Noted   Boniva [ibandronic acid] Nausea Only 02/21/2013   Lisinopril Other (See Comments) 04/14/2011   Norvasc [amlodipine besylate]  11/12/2011    Past Medical History:  Diagnosis Date   Allergy    allergic rhinitis   Family history of breast cancer    Family history of uterine cancer    Gilbert's syndrome    with high bilirubin    Hypertension    Osteoporosis     Past Surgical History:  Procedure Laterality Date   TONSILLECTOMY AND ADENOIDECTOMY  11/11/1955    Family History  Problem Relation Age of Onset   Heart disease Mother        heart attack   Hypertension Mother    Breast cancer Mother        ages 14 and 69   Cancer Mother    Hypertension Father    Alcohol abuse Father    Heart disease Maternal Grandfather        MI   Hypertension Sister    Osteopenia Sister    Lung cancer Paternal Uncle        heavy smoker   Heart disease Maternal Aunt    Dementia Paternal Grandmother    Uterine cancer Cousin        dx in her 24s   Uterine cancer Other        MGM's maternal aunt    Social History   Socioeconomic History   Marital status: Divorced    Spouse name: Not on file   Number of children: Not on file   Years of education: Not on file   Highest education level: Not on file  Occupational History   Not on file  Tobacco Use   Smoking status: Former    Types: Cigarettes    Quit date: 11/10/2000    Years since  quitting: 21.5   Smokeless tobacco: Never  Vaping Use   Vaping Use: Never used  Substance and Sexual Activity   Alcohol use: Yes    Alcohol/week: 5.0 standard drinks of alcohol    Types: 5 Glasses of wine per week    Comment: Wine   Drug use: No   Sexual activity: Not Currently    Partners: Female    Birth control/protection: Post-menopausal  Other Topics Concern   Not on file  Social History Narrative   Partner died last year.   3 children, daughter, who lives in [place], son in [place] and son in [place].   Social Determinants of Health   Financial Resource Strain: Low Risk  (02/03/2022)   Overall Financial Resource Strain (CARDIA)    Difficulty of Paying Living Expenses: Not hard at all  Food Insecurity: No Food Insecurity (02/03/2022)   Hunger Vital Sign    Worried About Running Out of Food in the Last Year: Never true    Ran Out of Food in the Last Year: Never true   Transportation Needs: No Transportation Needs (02/03/2022)   PRAPARE - Hydrologist (Medical): No    Lack of Transportation (Non-Medical): No  Physical Activity: Sufficiently Active (02/03/2022)   Exercise Vital Sign    Days of Exercise per Week: 5 days    Minutes of Exercise per Session: 60 min  Stress: No Stress Concern Present (02/03/2022)   [place]    Feeling of Stress : Not at all  Social Connections: Moderately Integrated (02/03/2022)   Social Connection and Isolation Panel [NHANES]    Frequency of Communication with Friends and Family: More than three times a week    Frequency of Social Gatherings with Friends and Family: More than three times a week    Attends Religious Services: More than 4 times per year    Active Member of [employer] or Organizations: Yes    Attends Music therapist: More than 4 times per year    Marital Status: Divorced  Intimate Partner Violence: Not At Risk (02/03/2022)   Humiliation, Afraid, Rape, and Kick questionnaire    Fear of Current or Ex-Partner: No    Emotionally Abused: No    Physically Abused: No    Sexually Abused: No      Review of systems: All other review of systems negative except as mentioned in the HPI.   Physical Exam: Vitals:   05/09/22 0828  BP: (!) 148/86  Pulse: 70   Body mass index is 19.7 kg/m. Gen:      No acute distress HEENT:  sclera anicteric Abd:      soft, non-tender; no palpable masses, no distension Ext:    No edema Neuro: alert and oriented x 3 Psych: normal mood and affect Rectal exam: Normal anal sphincter tone, no external hemorrhoids Anoscopy: Right anterior anal fissure, Small internal hemorrhoids, no active bleeding, normal dentate line, no visible nodules  Data Reviewed:  Reviewed labs, radiology imaging, old records and pertinent past GI work up   Assessment and  Plan/Recommendations:  71 year old very pleasant female with complaints of rectal irritation and stinging sensation secondary to anal fissure  Use nitroglycerin 0.125% 3-4 times daily for 6 to 8 weeks Benefiber 1 tablespoon 3 times daily with meals Increase dietary fiber and water intake Avoid excessive straining during defecation  Return in 2 months   The patient was provided an opportunity to  ask questions and all were answered. The patient agreed with the plan and demonstrated an understanding of the instructions.  Damaris Hippo , MD    CC: Tower, Wynelle Fanny, MD

## 2022-06-29 ENCOUNTER — Encounter: Payer: Self-pay | Admitting: Family Medicine

## 2022-06-30 MED ORDER — TRIAMCINOLONE ACETONIDE 0.1 % EX CREA: 1.0000 | TOPICAL_CREAM | Freq: Two times a day (BID) | CUTANEOUS | 1 refills | Status: DC

## 2022-07-01 ENCOUNTER — Other Ambulatory Visit: Payer: Self-pay | Admitting: Family Medicine

## 2022-07-12 ENCOUNTER — Other Ambulatory Visit: Payer: Self-pay | Admitting: Family Medicine

## 2022-07-15 DIAGNOSIS — L570 Actinic keratosis: Secondary | ICD-10-CM | POA: Diagnosis not present

## 2022-07-15 DIAGNOSIS — D2261 Melanocytic nevi of right upper limb, including shoulder: Secondary | ICD-10-CM | POA: Diagnosis not present

## 2022-07-15 DIAGNOSIS — L821 Other seborrheic keratosis: Secondary | ICD-10-CM | POA: Diagnosis not present

## 2022-07-15 DIAGNOSIS — L814 Other melanin hyperpigmentation: Secondary | ICD-10-CM | POA: Diagnosis not present

## 2022-07-15 DIAGNOSIS — S80861A Insect bite (nonvenomous), right lower leg, initial encounter: Secondary | ICD-10-CM | POA: Diagnosis not present

## 2022-07-15 DIAGNOSIS — L578 Other skin changes due to chronic exposure to nonionizing radiation: Secondary | ICD-10-CM | POA: Diagnosis not present

## 2022-07-15 DIAGNOSIS — S20462A Insect bite (nonvenomous) of left back wall of thorax, initial encounter: Secondary | ICD-10-CM | POA: Diagnosis not present

## 2022-07-15 DIAGNOSIS — D1801 Hemangioma of skin and subcutaneous tissue: Secondary | ICD-10-CM | POA: Diagnosis not present

## 2022-07-15 DIAGNOSIS — D224 Melanocytic nevi of scalp and neck: Secondary | ICD-10-CM | POA: Diagnosis not present

## 2022-07-15 DIAGNOSIS — S80862A Insect bite (nonvenomous), left lower leg, initial encounter: Secondary | ICD-10-CM | POA: Diagnosis not present

## 2022-08-06 ENCOUNTER — Encounter: Payer: Self-pay | Admitting: Gastroenterology

## 2022-08-06 ENCOUNTER — Ambulatory Visit (INDEPENDENT_AMBULATORY_CARE_PROVIDER_SITE_OTHER): Payer: Medicare Other | Admitting: Gastroenterology

## 2022-08-06 VITALS — BP 160/88 | HR 81 | Wt 111.8 lb

## 2022-08-06 DIAGNOSIS — K602 Anal fissure, unspecified: Secondary | ICD-10-CM | POA: Diagnosis not present

## 2022-08-06 NOTE — Patient Instructions (Addendum)
We have sent a prescription for nitroglycerin 0.125% gel to [facility]. You should apply a pea size amount to your rectum three times daily x 4-8 weeks.  [facility]'s information is below: Address: [address] [place], [place] [address]  [phone]  *Please DO NOT go directly from our office to pick up this medication! Give the pharmacy 1 day to process the prescription as this is compounded and takes time to make.  Benefiber 1-2 tablespoons 2-3 times daily with meals Increase dietary fiber and water intake Avoid excessive straining during defecation   The [facility] providers would like to encourage you to use [facility] to communicate with providers for non-urgent requests or questions.  Due to long hold times on the telephone, sending your provider a message by [facility] be a faster and more efficient way to get a response.  Please allow 48 business hours for a response.  Please remember that this is for non-urgent requests.

## 2022-08-06 NOTE — Progress Notes (Signed)
Karen Rice    [id number]    15-Sep-1951  Primary Care Physician:Tower, Wynelle Fanny, MD  Referring Physician: Tower, Wynelle Fanny, MD [place],  [place] [address]   Chief complaint:  Anal fissure  HPI:  71 year old very pleasant female here for follow-up visit for anal fissure.  She is using nitroglycerin per rectum with improvement but continues to have intermittent stinging sensation in the anorectal area.  She had small-volume bright red blood per rectum after she went on a 7 mile hike 2 weeks ago. She had multiple bowel movements when she took Benefiber, stopped it after taking it for few days.  On average she has daily bowel movement with soft stool.   Colonoscopy July 14, 2019: - Diverticulosis in the sigmoid colon and in the descending colon. - Non-bleeding internal hemorrhoids.   Colonoscopy September 24, 2006 by Dr. Olevia Perches normal exam, was recommended 10-year recall.   Outpatient Encounter Medications as of 08/06/2022  Medication Sig   AMBULATORY NON FORMULARY MEDICATION Medication Name: Nitroglycerin ointment 0.125% Use pea sized amount per rectum 3 times a day for 6-8 weeks   Ascorbic Acid (VITAMIN C) 1000 MG tablet Take 1,000 mg by mouth daily.   hydrocortisone (ANUSOL-HC) 2.5 % rectal cream Apply topically 2 (two) times daily. Do not use for more than 7 days.   Multiple Vitamin (MULTIVITAMIN) capsule Take 1 capsule by mouth daily.   olmesartan (BENICAR) 20 MG tablet TAKE 1 TABLET BY MOUTH EVERY DAY   tretinoin (RETIN-A) 0.05 % cream Uses seasonally   triamcinolone cream (KENALOG) 0.1 % Apply 1 Application topically 2 (two) times daily.   vitamin B-12 (CYANOCOBALAMIN) 500 MCG tablet Take 500 mcg by mouth daily.   VITAMIN D PO Take 3,000 Units by mouth.   No facility-administered encounter medications on file as of 08/06/2022.    Allergies as of 08/06/2022 - Review Complete 05/09/2022  Allergen Reaction Noted   Boniva [ibandronic acid]  Nausea Only 02/21/2013   Lisinopril Other (See Comments) 04/14/2011   Norvasc [amlodipine besylate]  11/12/2011    Past Medical History:  Diagnosis Date   Allergy    allergic rhinitis   Family history of breast cancer    Family history of uterine cancer    Gilbert's syndrome    with high bilirubin   Hypertension    Osteoporosis     Past Surgical History:  Procedure Laterality Date   TONSILLECTOMY AND ADENOIDECTOMY  11/11/1955    Family History  Problem Relation Age of Onset   Heart disease Mother        heart attack   Hypertension Mother    Breast cancer Mother        ages 5 and 21   Cancer Mother    Hypertension Father    Alcohol abuse Father    Heart disease Maternal Grandfather        MI   Hypertension Sister    Osteopenia Sister    Lung cancer Paternal Uncle        heavy smoker   Heart disease Maternal Aunt    Dementia Paternal Grandmother    Uterine cancer Cousin        dx in her 57s   Uterine cancer Other        MGM's maternal aunt    Social History   Socioeconomic History   Marital status: Divorced    Spouse name: Not on file  Number of children: Not on file   Years of education: Not on file   Highest education level: Not on file  Occupational History   Not on file  Tobacco Use   Smoking status: Former    Types: Cigarettes    Quit date: 11/10/2000    Years since quitting: 21.7   Smokeless tobacco: Never  Vaping Use   Vaping Use: Never used  Substance and Sexual Activity   Alcohol use: Yes    Alcohol/week: 5.0 standard drinks of alcohol    Types: 5 Glasses of wine per week    Comment: Wine   Drug use: No   Sexual activity: Not Currently    Partners: Female    Birth control/protection: Post-menopausal  Other Topics Concern   Not on file  Social History Narrative   Partner died last year.   3 children, daughter, who lives in [place], son in [place] and son in [place].   Social Determinants of Health   Financial Resource Strain: Low  Risk  (02/03/2022)   Overall Financial Resource Strain (CARDIA)    Difficulty of Paying Living Expenses: Not hard at all  Food Insecurity: No Food Insecurity (02/03/2022)   Hunger Vital Sign    Worried About Running Out of Food in the Last Year: Never true    Ran Out of Food in the Last Year: Never true  Transportation Needs: No Transportation Needs (02/03/2022)   PRAPARE - Hydrologist (Medical): No    Lack of Transportation (Non-Medical): No  Physical Activity: Sufficiently Active (02/03/2022)   Exercise Vital Sign    Days of Exercise per Week: 5 days    Minutes of Exercise per Session: 60 min  Stress: No Stress Concern Present (02/03/2022)   [place]    Feeling of Stress : Not at all  Social Connections: Moderately Integrated (02/03/2022)   Social Connection and Isolation Panel [NHANES]    Frequency of Communication with Friends and Family: More than three times a week    Frequency of Social Gatherings with Friends and Family: More than three times a week    Attends Religious Services: More than 4 times per year    Active Member of [employer] or Organizations: Yes    Attends Music therapist: More than 4 times per year    Marital Status: Divorced  Intimate Partner Violence: Not At Risk (02/03/2022)   Humiliation, Afraid, Rape, and Kick questionnaire    Fear of Current or Ex-Partner: No    Emotionally Abused: No    Physically Abused: No    Sexually Abused: No      Review of systems: All other review of systems negative except as mentioned in the HPI.   Physical Exam: Vitals:   08/06/22 0843  BP: (Abnormal) 160/88  Pulse: 81  SpO2: 99%   Body mass index is 19.49 kg/m. Gen:      No acute distress HEENT:  sclera anicteric Neuro: alert and oriented x 3 Psych: normal mood and affect Rectal exam: Left lateral anal fissure with tenderness  anoscopy: Not performed  Data  Reviewed:  Reviewed labs, radiology imaging, old records and pertinent past GI work up   Assessment and Plan/Recommendations:  71 year old very pleasant female with complaints of intermittent rectal irritation and stinging sensation secondary to anal fissure   Use nitroglycerin 0.125% 3 times daily for 4 weeks until symptoms resolve and then taper down to twice  daily for 1 to 2 weeks and once daily for additional 1 to 2 weeks to prevent recurrent anal fissure Benefiber 1-2 tablespoon 2-3 times daily with meals Increase dietary fiber and water intake Avoid excessive straining during defecation Advised patient to avoid going on long hikes or running more than 2 to 3 miles per day   Return in 2 months  The patient was provided an opportunity to ask questions and all were answered. The patient agreed with the plan and demonstrated an understanding of the instructions.  Damaris Hippo , MD    CC: Tower, Wynelle Fanny, MD

## 2022-08-15 ENCOUNTER — Other Ambulatory Visit: Payer: Self-pay

## 2022-08-15 ENCOUNTER — Other Ambulatory Visit: Payer: Self-pay | Admitting: Obstetrics & Gynecology

## 2022-08-15 DIAGNOSIS — Z1231 Encounter for screening mammogram for malignant neoplasm of breast: Secondary | ICD-10-CM

## 2022-08-15 MED ORDER — AMBULATORY NON FORMULARY MEDICATION: 1 refills | Status: DC

## 2022-08-20 ENCOUNTER — Ambulatory Visit: Payer: Medicare Other

## 2022-08-27 ENCOUNTER — Encounter: Payer: Self-pay | Admitting: Family Medicine

## 2022-08-27 ENCOUNTER — Ambulatory Visit (INDEPENDENT_AMBULATORY_CARE_PROVIDER_SITE_OTHER): Payer: Medicare Other | Admitting: Family Medicine

## 2022-08-27 ENCOUNTER — Ambulatory Visit: Payer: Medicare Other

## 2022-08-27 VITALS — BP 142/70 | HR 72 | Temp 97.6°F | Ht 63.0 in | Wt 114.0 lb

## 2022-08-27 DIAGNOSIS — J0100 Acute maxillary sinusitis, unspecified: Secondary | ICD-10-CM | POA: Diagnosis not present

## 2022-08-27 DIAGNOSIS — Z23 Encounter for immunization: Secondary | ICD-10-CM | POA: Diagnosis not present

## 2022-08-27 MED ORDER — CEFUROXIME AXETIL 500 MG PO TABS: 500.0000 mg | ORAL_TABLET | Freq: Two times a day (BID) | ORAL | 0 refills | Status: AC

## 2022-08-27 NOTE — Progress Notes (Signed)
Subjective:    Patient ID: Karen Rice, female    DOB: 1950/12/08, 71 y.o.   MRN: [id number]  HPI Pt presents for sinus symptoms/ear  Wt Readings from Last 3 Encounters:  08/27/22 114 lb (51.7 kg)  08/06/22 111 lb 12.8 oz (50.7 kg)  05/09/22 113 lb (51.3 kg)   20.19 kg/m  Had uri 2nd wk in sept with ST and hoarse voice (not covid- 4 neg tests)  [place]  Pnd  Clear to cloudy d/c Ears are clogged  Pain in L side of her face/ cheek   No fever Cough is gone now   Taking allergy meds Is draggy   Otc Allegra plain  Nasacort ns   Does not tolerate augmentin well   Patient Active Problem List   Diagnosis Date Noted   Family history of sudden cardiac death 03-21-21   Acute sinusitis 11/19/2020   Grief reaction 09/07/2020   Medicare annual wellness visit, initial 12/02/2018   Colon cancer screening 12/02/2018   Genetic testing 08/26/2018   Family history of breast cancer    Family history of uterine cancer    Hypertension 03/03/2011   Routine general medical examination at a health care facility 03/03/2011   Age-related osteoporosis without fracture 07/21/2007   Past Medical History:  Diagnosis Date   Allergy    allergic rhinitis   Family history of breast cancer    Family history of uterine cancer    Gilbert's syndrome    with high bilirubin   Hypertension    Osteoporosis    Past Surgical History:  Procedure Laterality Date   TONSILLECTOMY AND ADENOIDECTOMY  11/11/1955   Social History   Tobacco Use   Smoking status: Former    Types: Cigarettes    Quit date: 11/10/2000    Years since quitting: 21.8   Smokeless tobacco: Never  Vaping Use   Vaping Use: Never used  Substance Use Topics   Alcohol use: Yes    Alcohol/week: 5.0 standard drinks of alcohol    Types: 5 Glasses of wine per week    Comment: Wine   Drug use: No   Family History  Problem Relation Age of Onset   Heart disease Mother        heart attack   Hypertension Mother    Breast  cancer Mother        ages 7 and 67   Cancer Mother    Hypertension Father    Alcohol abuse Father    Heart disease Maternal Grandfather        MI   Hypertension Sister    Osteopenia Sister    Lung cancer Paternal Uncle        heavy smoker   Heart disease Maternal Aunt    Dementia Paternal Grandmother    Uterine cancer Cousin        dx in her 37s   Uterine cancer Other        MGM's maternal aunt   Allergies  Allergen Reactions   Boniva [Ibandronic Acid] Nausea Only   Lisinopril Other (See Comments)    Severe fatigue   Norvasc [Amlodipine Besylate]     Fatigue/ depression   Current Outpatient Medications on File Prior to Visit  Medication Sig Dispense Refill   AMBULATORY NON FORMULARY MEDICATION Medication Name: Nitroglycerin ointment 0.125% Use pea sized amount per rectum 3 times a day for 4-8 weeks 30 g 1   Ascorbic Acid (VITAMIN C) 1000 MG tablet Take 1,000 mg by  mouth daily.     Multiple Vitamin (MULTIVITAMIN) capsule Take 1 capsule by mouth daily.     olmesartan (BENICAR) 20 MG tablet TAKE 1 TABLET BY MOUTH EVERY DAY (Patient taking differently: Take 10 mg by mouth daily.) 90 tablet 0   tretinoin (RETIN-A) 0.05 % cream Uses seasonally     vitamin B-12 (CYANOCOBALAMIN) 500 MCG tablet Take 500 mcg by mouth daily.     VITAMIN D PO Take 3,000 Units by mouth.     triamcinolone cream (KENALOG) 0.1 % Apply 1 Application topically 2 (two) times daily. (Patient not taking: Reported on 08/27/2022) 30 g 1   No current facility-administered medications on file prior to visit.       Review of Systems  Constitutional:  Positive for fatigue. Negative for activity change, appetite change, fever and unexpected weight change.  HENT:  Positive for congestion, ear pain, postnasal drip, sinus pressure and sinus pain. Negative for rhinorrhea and sore throat.   Eyes:  Negative for pain, redness and visual disturbance.  Respiratory:  Negative for cough, shortness of breath and wheezing.    Cardiovascular:  Negative for chest pain and palpitations.  Gastrointestinal:  Negative for abdominal pain, blood in stool, constipation and diarrhea.  Endocrine: Negative for polydipsia and polyuria.  Genitourinary:  Negative for dysuria, frequency and urgency.  Musculoskeletal:  Negative for arthralgias, back pain and myalgias.  Skin:  Negative for pallor and rash.  Allergic/Immunologic: Negative for environmental allergies.  Neurological:  Positive for headaches. Negative for dizziness and syncope.  Hematological:  Negative for adenopathy. Does not bruise/bleed easily.  Psychiatric/Behavioral:  Negative for decreased concentration and dysphoric mood. The patient is not nervous/anxious.        Objective:   Physical Exam Constitutional:      General: She is not in acute distress.    Appearance: Normal appearance. She is well-developed and normal weight. She is not ill-appearing.  HENT:     Head: Normocephalic and atraumatic.     Comments: Nares are injected and mildly congested  Clear pnd   L maxillary /ethmoid sinus tenderness    Right Ear: Tympanic membrane, ear canal and external ear normal.     Left Ear: Tympanic membrane, ear canal and external ear normal.     Ears:     Comments: TMs are dull but clear     Nose: Congestion and rhinorrhea present.     Mouth/Throat:     Mouth: Mucous membranes are moist.     Pharynx: No oropharyngeal exudate or posterior oropharyngeal erythema.     Comments: Clear pnd Eyes:     General:        Right eye: No discharge.        Left eye: No discharge.     Conjunctiva/sclera: Conjunctivae normal.     Pupils: Pupils are equal, round, and reactive to light.  Cardiovascular:     Rate and Rhythm: Normal rate and regular rhythm.  Pulmonary:     Effort: Pulmonary effort is normal. No respiratory distress.     Breath sounds: Normal breath sounds. No stridor. No wheezing, rhonchi or rales.  Musculoskeletal:     Cervical back: Normal range of  motion and neck supple.  Lymphadenopathy:     Cervical: No cervical adenopathy.  Skin:    General: Skin is warm and dry.     Findings: No rash.  Neurological:     Mental Status: She is alert.     Cranial Nerves: No cranial nerve deficit.  Psychiatric:        Mood and Affect: Mood normal.           Assessment & Plan:   Problem List Items Addressed This Visit       Respiratory   Acute sinusitis    About a month after viral uri  Now some L sided facial pain/mild congestion and ear pressure No new dental problems  Px ceftin 500 mg bid for 7 d  Disc symptom control Continue allergy medicines and steroid ns if helpful  Update if not starting to improve in a week or if worsening        Relevant Medications   cefUROXime (CEFTIN) 500 MG tablet   Other Visit Diagnoses     Need for influenza vaccination    -  Primary   Relevant Orders   Flu Vaccine QUAD 6+ mos PF IM (Fluarix Quad PF) (Completed)

## 2022-08-27 NOTE — Patient Instructions (Signed)
Nasal saline is helpful Continue the nasacort nasal spray - daily  Allegra is ok   Lots of fluids  Warm compresses   Take the ceftin as directed   Update if not starting to improve in a week or if worsening

## 2022-08-27 NOTE — Assessment & Plan Note (Signed)
About a month after viral uri  Now some L sided facial pain/mild congestion and ear pressure No new dental problems  Px ceftin 500 mg bid for 7 d  Disc symptom control Continue allergy medicines and steroid ns if helpful  Update if not starting to improve in a week or if worsening

## 2022-09-24 ENCOUNTER — Ambulatory Visit
Admission: RE | Admit: 2022-09-24 | Discharge: 2022-09-24 | Disposition: A | Discharge status: Home / Self Care | Payer: Medicare Other | Source: Ambulatory Visit | Attending: Obstetrics & Gynecology | Admitting: Obstetrics & Gynecology

## 2022-09-24 DIAGNOSIS — Z1231 Encounter for screening mammogram for malignant neoplasm of breast: Secondary | ICD-10-CM | POA: Diagnosis not present

## 2022-10-07 DIAGNOSIS — Z23 Encounter for immunization: Secondary | ICD-10-CM | POA: Diagnosis not present

## 2022-10-11 ENCOUNTER — Other Ambulatory Visit: Payer: Self-pay | Admitting: Family Medicine

## 2022-10-20 ENCOUNTER — Ambulatory Visit: Payer: Medicare Other | Admitting: Gastroenterology

## 2022-11-07 ENCOUNTER — Encounter: Payer: Self-pay | Admitting: Family Medicine

## 2022-11-07 ENCOUNTER — Other Ambulatory Visit: Payer: Self-pay | Admitting: Family Medicine

## 2022-11-07 MED ORDER — ACETAZOLAMIDE 125 MG PO TABS: ORAL_TABLET | ORAL | 0 refills | Status: DC

## 2022-11-07 NOTE — Progress Notes (Signed)
Refill diamox

## 2022-11-21 ENCOUNTER — Ambulatory Visit (INDEPENDENT_AMBULATORY_CARE_PROVIDER_SITE_OTHER): Payer: Medicare Other | Admitting: Obstetrics & Gynecology

## 2022-11-21 ENCOUNTER — Encounter (HOSPITAL_BASED_OUTPATIENT_CLINIC_OR_DEPARTMENT_OTHER): Payer: Self-pay | Admitting: Obstetrics & Gynecology

## 2022-11-21 ENCOUNTER — Other Ambulatory Visit (HOSPITAL_COMMUNITY)
Admission: RE | Admit: 2022-11-21 | Discharge: 2022-11-21 | Disposition: A | Discharge status: Home / Self Care | Payer: Medicare Other | Source: Ambulatory Visit | Attending: Obstetrics & Gynecology | Admitting: Obstetrics & Gynecology

## 2022-11-21 VITALS — BP 158/82 | HR 71 | Ht 63.5 in | Wt 114.0 lb

## 2022-11-21 DIAGNOSIS — L293 Anogenital pruritus, unspecified: Secondary | ICD-10-CM

## 2022-11-21 DIAGNOSIS — Z01411 Encounter for gynecological examination (general) (routine) with abnormal findings: Secondary | ICD-10-CM | POA: Diagnosis not present

## 2022-11-21 DIAGNOSIS — Z803 Family history of malignant neoplasm of breast: Secondary | ICD-10-CM | POA: Diagnosis not present

## 2022-11-21 DIAGNOSIS — M818 Other osteoporosis without current pathological fracture: Secondary | ICD-10-CM | POA: Diagnosis not present

## 2022-11-21 DIAGNOSIS — K602 Anal fissure, unspecified: Secondary | ICD-10-CM | POA: Diagnosis not present

## 2022-11-21 MED ORDER — AMBULATORY NON FORMULARY MEDICATION: 1 refills | Status: DC

## 2022-11-21 NOTE — Progress Notes (Unsigned)
72 y.o. [id number] Divorced White or Caucasian female here for breast and pelvic exam.  She is PMP, not on HRT.  Denies vaginal bleeding.  Did have anal fissure last year.  Saw Dr. Silverio Decamp.  Was on topical nitroglycerine.  Bleeding is much better.  Has follow up scheduled mid February.  Needs refill and asks if I can do this today.  Denies rectal bleeding.  Feels it really has healed.  Having some vulvar irritation symptoms.  Doing a lot of walking/hiking.  Not sure if contributing.  Patient's last menstrual period was 11/10/2002.          Sexually active: No.  H/O STD:  no  Health Maintenance: PCP:  Dr. Glori Bickers.  Last wellness appt was 01/2022.  Did blood work at that appt: yes Vaccines are up to date:  reviewed with pt.   Colonoscopy:  07/14/2019 MMG:  09/24/2022 Negative BMD:  01/16/2022 Osteoporosis Last pap smear:  06/17/2019 Negative.   H/o abnormal pap smear:  no recent abnormal    reports that she quit smoking about 22 years ago. Her smoking use included cigarettes. She has never used smokeless tobacco. She reports current alcohol use of about 5.0 standard drinks of alcohol per week. She reports that she does not use drugs.  Past Medical History:  Diagnosis Date   Allergy    allergic rhinitis   Family history of breast cancer    Family history of uterine cancer    Gilbert's syndrome    with high bilirubin   Hypertension    Osteoporosis     Past Surgical History:  Procedure Laterality Date   TONSILLECTOMY AND ADENOIDECTOMY  11/11/1955    Current Outpatient Medications  Medication Sig Dispense Refill   acetaZOLAMIDE (DIAMOX) 125 MG tablet Take 1 pill by mouth twice daily for 1 day before climb through the duration of climb 20 tablet 0   AMBULATORY NON FORMULARY MEDICATION Medication Name: Nitroglycerin ointment 0.125% Use pea sized amount per rectum 3 times a day for 4-8 weeks 30 g 1   Ascorbic Acid (VITAMIN C) 1000 MG tablet Take 1,000 mg by mouth daily.     Multiple  Vitamin (MULTIVITAMIN) capsule Take 1 capsule by mouth daily.     olmesartan (BENICAR) 20 MG tablet TAKE 1 TABLET BY MOUTH EVERY DAY 90 tablet 1   tretinoin (RETIN-A) 0.05 % cream Uses seasonally     triamcinolone cream (KENALOG) 0.1 % Apply 1 Application topically 2 (two) times daily. 30 g 1   vitamin B-12 (CYANOCOBALAMIN) 500 MCG tablet Take 500 mcg by mouth daily.     VITAMIN D PO Take 3,000 Units by mouth.     No current facility-administered medications for this visit.    Family History  Problem Relation Age of Onset   Heart disease Mother        heart attack   Hypertension Mother    Breast cancer Mother        ages 30 and 11   Cancer Mother    Hypertension Father    Alcohol abuse Father    Heart disease Maternal Grandfather        MI   Hypertension Sister    Osteopenia Sister    Lung cancer Paternal Uncle        heavy smoker   Heart disease Maternal Aunt    Dementia Paternal Grandmother    Uterine cancer Cousin        dx in her 73s   Uterine cancer Other  MGM's maternal aunt    Review of Systems  Constitutional: Negative.   Genitourinary: Negative.     Exam:   BP (!) 158/82 (BP Location: Right Arm, Patient Position: Sitting, Cuff Size: Normal)   Pulse 71   Ht 5' 3.5" (1.613 m) Comment: Reported  Wt 114 lb (51.7 kg)   LMP 11/10/2002   BMI 19.88 kg/m   Height: 5' 3.5" (161.3 cm) (Reported)  General appearance: alert, cooperative and appears stated age Breasts: normal appearance, no masses or tenderness Abdomen: soft, non-tender; bowel sounds normal; no masses,  no organomegaly Lymph nodes: Cervical, supraclavicular, and axillary nodes normal.  No abnormal inguinal nodes palpated Neurologic: Grossly normal  Pelvic: External genitalia:  significant inner labia erythema              Urethra:  normal appearing urethra with no masses, tenderness or lesions              Bartholins and Skenes: normal                 Vagina: normal appearing vagina with  atrophic changes and no discharge, no lesions              Cervix: no lesions              Pap taken: No. Bimanual Exam:  Uterus:  normal size, contour, position, consistency, mobility, non-tender              Adnexa: no mass, fullness, tenderness              Rectal exam:  deferred due to fissure  Chaperone, Octaviano Batty, CMA, was present for exam.  Assessment/Plan: 1. Encntr for gyn exam (general) (routine) w abnormal findings - Pap smear neg 06/17/2019.  Guidelines reviewed.  Have decided to stop screening pap smears - Mammogram 09/24/2022 - Colonoscopy 07/14/2019 - Bone mineral density 01/16/2022 with osteoporosis - lab work done with PCP, Dr. Glori Bickers - vaccines reviewed/updated  2. Perineal itching, female - Cervicovaginal ancillary only( [place]) - triamcinolone cream (KENALOG) 0.1 %; Apply 1 Application topically 2 (two) times daily. Can use for up to 7 days.  Dispense: 30 g; Refill: 0  3. Family history of breast cancer  4. Age-related osteoporosis without fracture - declined treatment for now  5. Anal fissure - RF for topical nitroglycerine given.  Has follow up with Dr. Silverio Decamp next month - AMBULATORY NON [place]; Medication Name: Nitroglycerin ointment 0.125% Use pea sized amount per rectum 3 times a day for 4-8 weeks  Dispense: 30 g; Refill: 1

## 2022-11-23 LAB — CERVICOVAGINAL ANCILLARY ONLY
Bacterial Vaginitis (gardnerella): NEGATIVE
Candida Glabrata: NEGATIVE
Candida Vaginitis: NEGATIVE
Comment: NEGATIVE
Comment: NEGATIVE
Comment: NEGATIVE

## 2022-11-24 MED ORDER — TRIAMCINOLONE ACETONIDE 0.1 % EX CREA: 1.0000 | TOPICAL_CREAM | Freq: Two times a day (BID) | CUTANEOUS | 0 refills | Status: AC

## 2022-11-25 DIAGNOSIS — K602 Anal fissure, unspecified: Secondary | ICD-10-CM | POA: Insufficient documentation

## 2022-12-26 ENCOUNTER — Ambulatory Visit: Payer: Medicare Other | Admitting: Gastroenterology

## 2023-01-13 ENCOUNTER — Encounter: Payer: Self-pay | Admitting: Family Medicine

## 2023-01-13 ENCOUNTER — Other Ambulatory Visit: Payer: Self-pay | Admitting: Family Medicine

## 2023-01-13 MED ORDER — ACETAZOLAMIDE 125 MG PO TABS: ORAL_TABLET | ORAL | 0 refills | Status: DC

## 2023-02-04 ENCOUNTER — Telehealth: Payer: Self-pay | Admitting: Family Medicine

## 2023-02-04 NOTE — Telephone Encounter (Signed)
Contacted Karen Rice to schedule their annual wellness visit. Appointment made for 02/10/23.  [facility] [employer] AW[facility] direct phone # [phone]  Spoke with patient she is aware of appt date/time change

## 2023-02-04 NOTE — Telephone Encounter (Signed)
Patient would like to be rescheduled for her awv phone call on 02/05/2023,she is scheduled for her cpe on 02/23/2023.I am not finding a place on the schedule to reschedule her before them.

## 2023-02-10 ENCOUNTER — Telehealth (INDEPENDENT_AMBULATORY_CARE_PROVIDER_SITE_OTHER): Payer: Medicare Other | Admitting: Family Medicine

## 2023-02-10 DIAGNOSIS — Z0000 Encounter for general adult medical examination without abnormal findings: Secondary | ICD-10-CM | POA: Diagnosis not present

## 2023-02-10 NOTE — Progress Notes (Signed)
PATIENT CHECK-IN and HEALTH RISK ASSESSMENT QUESTIONNAIRE:  -completed by phone/video for upcoming Medicare Preventive Visit  Pre-Visit Check-in: 1)Vitals (height, wt, BP, etc) - record in vitals section for visit on day of visit 2)Review and Update Medications, Allergies PMH, Surgeries, Social history in Epic 3)Hospitalizations in the last year with date/reason? No   4)Review and Update Care Team (patient's specialists) in Epic 5) Complete PHQ9 in Epic  6) Complete Fall Screening in Epic 7)Review all Health Maintenance Due and order under PCP if not done.  8)Medicare Wellness Questionnaire: Answer theses question about your habits: Do you drink alcohol? Yes    If yes, how many drinks do you have a day?1 drink per day Have you ever smoked?Yes   Quit date if applicable?  quit 20 years ago How many packs a day do/did you smoke? 8 cigarettes per day Do you use smokeless tobacco?No Do you use an illicit drugs?No  Do you exercises? Yes   IF so, what type and how many days/minutes per week?Walk 2 to 4 miles per day, 45 mins to 1 hour 1/2   weight bearing exercise 15 mins .  Kayak- 1 to 2 hours Are you sexually active? Yes-   number of partners?1 Typical breakfast: Oatmeal with fruit, egg white omelet Typical lunch:  cottage cheese, carrots, tomato, sandwich Typical dinner: chicken, potatoes, vegetables Typical snacks:popcorn, fruit, potato chips  Beverages: water, coffee couple of cups in the morning.   Answer theses question about you: Can you perform most household chores?Yes  Do you find it hard to follow a conversation in a noisy room?No  Do you often ask people to speak up or repeat themselves?No  Do you feel that you have a problem with memory?No Do you balance your checkbook and or bank acounts?Online banking Do you feel safe at home?Yes  Last dentist visit?few weeks ago  Do you need assistance with any of the following: Please note if so   Driving?-No  Feeding yourself?-No    Getting from bed to chair?-No   Getting to the toilet?-No   Bathing or showering?-No   Dressing yourself?-No   Managing money?-No   Climbing a flight of stairs-No   Preparing meals?-No   Do you have Advanced Directives in place (Living Will, Healthcare Power or Attorney)? Yes    Last eye Exam and location?[place] 2023.   [facility]    Do you currently use prescribed or non-prescribed narcotic or opioid pain medications?No  Do you have a history or close family history of breast, ovarian, tubal or peritoneal cancer or a family member with BRCA (breast cancer susceptibility 1 and 2) gene mutations?  Nurse/Assistant Credentials/time stamp:  St   ----------------------------------------------------------------------------------------------------------------------------------------------------------------------------------------------------------------------   MEDICARE ANNUAL PREVENTIVE VISIT WITH PROVIDER: (Welcome to [employer], initial annual wellness or annual wellness exam)  Virtual Visit via Video:  I connected with Marinda Elk Danley on 02/10/23 by a video enabled telemedicine application and verified that I am speaking with the correct person using two identifiers.  Location patient: home Location provider:work or home office Persons participating in the virtual visit: patient, provider  Concerns and/or follow up today: reports she is doing well.    See HM section in Epic for other details of completed HM.    ROS: negative for report of fevers, unintentional weight loss, vision changes, vision loss, hearing loss or change, chest pain, sob, hemoptysis, melena, hematochezia, hematuria, falls, bleeding or bruising, loc, thoughts of suicide or self harm, memory loss  Patient-completed extensive health risk assessment -  reviewed and discussed with the patient: See Health Risk Assessment completed with patient prior to the visit either above or in recent phone note. This was  reviewed in detailed with the patient today and appropriate recommendations, orders and referrals were placed as needed per Summary below and patient instructions.   Review of Medical History: -PMH, PSH, Family History and current specialty and care providers reviewed and updated and listed below   Patient Care Team: Tower, Wynelle Fanny, MD as PCP - General   Past Medical History:  Diagnosis Date   Allergy    allergic rhinitis   Family history of breast cancer    Family history of uterine cancer    Gilbert's syndrome    with high bilirubin   Hypertension    Osteoporosis     Past Surgical History:  Procedure Laterality Date   TONSILLECTOMY AND ADENOIDECTOMY  11/11/1955    Social History   Socioeconomic History   Marital status: Divorced    Spouse name: Not on file   Number of children: Not on file   Years of education: Not on file   Highest education level: Not on file  Occupational History   Not on file  Tobacco Use   Smoking status: Former    Types: Cigarettes    Quit date: 11/10/2000    Years since quitting: 22.2   Smokeless tobacco: Never  Vaping Use   Vaping Use: Never used  Substance and Sexual Activity   Alcohol use: Yes    Alcohol/week: 5.0 standard drinks of alcohol    Types: 5 Glasses of wine per week    Comment: Wine   Drug use: No   Sexual activity: Not Currently    Partners: Female    Birth control/protection: Post-menopausal  Other Topics Concern   Not on file  Social History Narrative   Partner died last year.   3 children, daughter, who lives in [place], son in [place] and son in [place].   Social Determinants of Health   Financial Resource Strain: Low Risk  (02/03/2022)   Overall Financial Resource Strain (CARDIA)    Difficulty of Paying Living Expenses: Not hard at all  Food Insecurity: No Food Insecurity (02/03/2022)   Hunger Vital Sign    Worried About Running Out of Food in the Last Year: Never true    Ran Out of Food in the Last Year:  Never true  Transportation Needs: No Transportation Needs (02/03/2022)   PRAPARE - Hydrologist (Medical): No    Lack of Transportation (Non-Medical): No  Physical Activity: Sufficiently Active (02/03/2022)   Exercise Vital Sign    Days of Exercise per Week: 5 days    Minutes of Exercise per Session: 60 min  Stress: No Stress Concern Present (02/03/2022)   [place]    Feeling of Stress : Not at all  Social Connections: Moderately Integrated (02/03/2022)   Social Connection and Isolation Panel [NHANES]    Frequency of Communication with Friends and Family: More than three times a week    Frequency of Social Gatherings with Friends and Family: More than three times a week    Attends Religious Services: More than 4 times per year    Active Member of [employer] or Organizations: Yes    Attends Archivist Meetings: More than 4 times per year    Marital Status: Divorced  Intimate Partner Violence: Not At Risk (02/03/2022)  Humiliation, Afraid, Rape, and Kick questionnaire    Fear of Current or Ex-Partner: No    Emotionally Abused: No    Physically Abused: No    Sexually Abused: No    Family History  Problem Relation Age of Onset   Heart disease Mother        heart attack   Hypertension Mother    Breast cancer Mother        ages 74 and 26   Cancer Mother    Hypertension Father    Alcohol abuse Father    Heart disease Maternal Grandfather        MI   Hypertension Sister    Osteopenia Sister    Lung cancer Paternal Uncle        heavy smoker   Heart disease Maternal Aunt    Dementia Paternal Grandmother    Uterine cancer Cousin        dx in her 71s   Uterine cancer Other        MGM's maternal aunt    Current Outpatient Medications on File Prior to Visit  Medication Sig Dispense Refill   acetaZOLAMIDE (DIAMOX) 125 MG tablet Take 1 pill by mouth twice daily for 1 day before climb  through the duration of climb 20 tablet 0   AMBULATORY NON FORMULARY MEDICATION Medication Name: Nitroglycerin ointment 0.125% Use pea sized amount per rectum 3 times a day for 4-8 weeks 30 g 1   Ascorbic Acid (VITAMIN C) 1000 MG tablet Take 1,000 mg by mouth daily.     Multiple Vitamin (MULTIVITAMIN) capsule Take 1 capsule by mouth daily.     olmesartan (BENICAR) 20 MG tablet TAKE 1 TABLET BY MOUTH EVERY DAY 90 tablet 1   tretinoin (RETIN-A) 0.05 % cream Uses seasonally     triamcinolone cream (KENALOG) 0.1 % Apply 1 Application topically 2 (two) times daily. Can use for up to 7 days. 30 g 0   vitamin B-12 (CYANOCOBALAMIN) 500 MCG tablet Take 500 mcg by mouth daily.     VITAMIN D PO Take 3,000 Units by mouth.     No current facility-administered medications on file prior to visit.    Allergies  Allergen Reactions   Boniva [Ibandronic Acid] Nausea Only   Lisinopril Other (See Comments)    Severe fatigue   Norvasc [Amlodipine Besylate]     Fatigue/ depression       Physical Exam There were no vitals filed for this visit. Estimated body mass index is 19.88 kg/m as calculated from the following:   Height as of 11/21/22: 5' 3.5" (1.613 m).   Weight as of 11/21/22: 114 lb (51.7 kg).  EKG (optional): deferred due to virtual visit  GENERAL: alert, oriented, no acute distress detected, full vision exam deferred due to pandemic and/or virtual encounter  HEENT: atraumatic, conjunttiva clear, no obvious abnormalities on inspection of external nose and ears  NECK: normal movements of the head and neck  LUNGS: on inspection no signs of respiratory distress, breathing rate appears normal, no obvious gross SOB, gasping or wheezing  CV: no obvious cyanosis  MS: moves all visible extremities without noticeable abnormality  PSYCH/NEURO: pleasant and cooperative, no obvious depression or anxiety, speech and thought processing grossly intact, Cognitive function grossly intact  Flowsheet Row  Video Visit from 02/10/2023 in [place] at [facility] Total Score 0           02/10/2023    4:01 PM 11/21/2022   10:20  AM 02/03/2022    9:07 AM 11/15/2021    8:53 AM 02/01/2021    9:08 AM  Depression screen PHQ 2/9  Decreased Interest 0 0 0 0 0  Down, Depressed, Hopeless 0 0 0 1 0  PHQ - 2 Score 0 0 0 1 0  Altered sleeping 0    0  Tired, decreased energy 0    0  Change in appetite 0    0  Feeling bad or failure about yourself  0    0  Trouble concentrating 0    0  Moving slowly or fidgety/restless 0    0  Suicidal thoughts 0    0  PHQ-9 Score 0    0  Difficult doing work/chores Not difficult at all    Not difficult at all       12/02/2018   12:27 PM 01/31/2020   10:35 AM 02/01/2021    9:02 AM 02/03/2022    9:14 AM 02/10/2023    4:01 PM  Fall Risk  Falls in the past year? 0 0 0 0 0  Was there an injury with Fall?  0 0 0 0  Fall Risk Category Calculator  0 0 0 0  Fall Risk Category (Retired)  Low Low Low   (RETIRED) Patient Fall Risk Level Low fall risk Low fall risk Low fall risk Low fall risk   Patient at Risk for Falls Due to  Medication side effect Medication side effect No Fall Risks   Fall risk Follow up  Falls prevention discussed;Falls evaluation completed Falls evaluation completed;Falls prevention discussed Falls prevention discussed      SUMMARY AND PLAN:  Encounter for Medicare annual wellness exam   Discussed applicable health maintenance/preventive health measures and advised and referred or ordered per patient preferences:  Health Maintenance  Topic Date Due   Zoster Vaccines- Shingrix (1 of 2) Never done, discussed, advised can get at pharmacy if wishes to do   INFLUENZA VACCINE  06/11/2023   DTaP/Tdap/Td (4 - Td or Tdap) 09/16/2023   MAMMOGRAM  09/25/2023   Medicare Annual Wellness (AWV)  02/10/2024   COLONOSCOPY (Pts 45-26yrs Insurance coverage will need to be confirmed)  07/13/2029   Pneumonia Vaccine 56+ Years old  Completed    DEXA SCAN  Completed in 2023   COVID-19 Vaccine  Completed   Hepatitis C Screening  Completed   HPV VACCINES  Aged [employer] and counseling on the following was provided based on the above review of health and a plan/checklist for the patient, along with additional information discussed, was provided for the patient in the patient instructions :   -Discussed importance of healthy diet and regular exercise: A summary of a healthy diet was provided in the Patient Instructions. -congratulated on activity level, discussed guidelines, she is considering Tai Chi and discussed on options for this and group exercise within the community.  -Advise yearly dental visits at minimum and regular eye exams   Follow up: see patient instructions     Patient Instructions  I really enjoyed getting to talk with you today! I am available on Tuesdays and Thursdays for virtual visits if you have any questions or concerns, or if I can be of any further assistance.   CHECKLIST FROM ANNUAL WELLNESS VISIT:  -Follow up (please call to schedule if not scheduled after visit):   -yearly for annual wellness visit with primary care office  Here is a list of your preventive care/health maintenance measures  and the plan for each if any are due:  Health Maintenance  Topic Date Due   Zoster Vaccines- Shingrix (1 of 2) Never done - can get at pharmacy, please let us know when you get this so that we can update it in your record.    INFLUENZA VACCINE  06/11/2023   DTaP/Tdap/Td (4 - Td or Tdap) 09/16/2023   MAMMOGRAM  09/25/2023   Medicare Annual Wellness (AWV)  02/10/2024   COLONOSCOPY (Pts 45-83yrs Insurance coverage will need to be confirmed)  07/13/2029   Pneumonia Vaccine 75+ Years old  Completed   DEXA SCAN  Completed   COVID-19 Vaccine  Completed   Hepatitis C Screening  Completed   HPV VACCINES  Aged Out    -See a dentist at least yearly  -Get your eyes checked and then per your eye  specialist's recommendations  -Other issues addressed today:   -I have included below further information regarding a healthy whole foods based diet, physical activity guidelines for adults, stress management and opportunities for social connections. I hope you find this information useful.   -----------------------------------------------------------------------------------------------------------------------------------------------------------------------------------------------------------------------------------------------------------  NUTRITION: -eat real food: lots of colorful vegetables (half the plate) and fruits -5-7 servings of vegetables and fruits per day (fresh or steamed is best), exp. 2 servings of vegetables with lunch and dinner and 2 servings of fruit per day. Berries and greens such as kale and collards are great choices.  -consume on a regular basis: whole grains (make sure first ingredient on label contains the word "whole"), fresh fruits, fish, nuts, seeds, healthy oils (such as olive oil, avocado oil, grape seed oil) -Donaway eat small amounts of dairy and lean meat on occasion, but avoid processed meats such as ham, bacon, lunch meat, etc. -drink water -try to avoid fast food and pre-packaged foods, processed meat -most experts advise limiting sodium to < 2300mg  per day, should limit further is any chronic conditions such as high blood pressure, heart disease, diabetes, etc. The American Heart Association advised that < 1500mg  is is ideal -try to avoid foods that contain any ingredients with names you do not recognize  -try to avoid sugar/sweets (except for the natural sugar that occurs in fresh fruit) -try to avoid sweet drinks -try to avoid white rice, white bread, pasta (unless whole grain), white or yellow potatoes  EXERCISE GUIDELINES FOR ADULTS: -if you wish to increase your physical activity, do so gradually and with the approval of your doctor -STOP and seek  medical care immediately if you have any chest pain, chest discomfort or trouble breathing when starting or increasing exercise  -move and stretch your body, legs, feet and arms when sitting for long periods -Physical activity guidelines for optimal health in adults: -least 150 minutes per week of aerobic exercise (can talk, but not sing) once approved by your doctor, 20-30 minutes of sustained activity or two 10 minute episodes of sustained activity every day.  -resistance training at least 2 days per week if approved by your doctor -balance exercises 3+ days per week:   Stand somewhere where you have something sturdy to hold onto if you lose balance.    1) lift up on toes, start with 5x per day and work up to 20x   2) stand and lift on leg straight out to the side so that foot is a few inches of the floor, start with 5x each side and work up to 20x each side   3) stand on one foot, start with 5 seconds  each side and work up to 20 seconds on each side  If you need ideas or help with getting more active:  -Silver sneakers https://tools.silversneakers.com  -Walk with a Doc: http://stephens-thompson.biz/  -try to include resistance (weight lifting/strength building) and balance exercises twice per week: or the following link for ideas: ChessContest.fr  UpdateClothing.com.cy  STRESS MANAGEMENT: -can try meditating, or just sitting quietly with deep breathing while intentionally relaxing all parts of your body for 5 minutes daily -if you need further help with stress, anxiety or depression please follow up with your primary doctor or contact the wonderful folks at [place]: [place]: -options in [place] if you wish to engage in more social and exercise related activities:  -Silver sneakers https://tools.silversneakers.com  -Walk with a  Doc: http://stephens-thompson.biz/  -Check out the [place] 50+ section on the [place] [employer] (hiking clubs, book clubs, cards and games, chess, exercise classes, aquatic classes and much more) - see the website for details: https://www.[place].gov/departments/parks-recreation/active-adults50  -YouTube has lots of exercise videos for different ages and abilities as well  -[employer] (a variety of indoor and outdoor inperson activities for adults). 403-751-2070. [address]  -Virtual Online Classes (a variety of topics): see seniorplanet.org or call 9120249942  -consider volunteering at a school, hospice center, church, senior center or elsewhere           Lucretia Kern, DO

## 2023-02-10 NOTE — Patient Instructions (Addendum)
I really enjoyed getting to talk with you today! I am available on Tuesdays and Thursdays for virtual visits if you have any questions or concerns, or if I can be of any further assistance.   CHECKLIST FROM ANNUAL WELLNESS VISIT:  -Follow up (please call to schedule if not scheduled after visit):   -yearly for annual wellness visit with primary care office  Here is a list of your preventive care/health maintenance measures and the plan for each if any are due:  Health Maintenance  Topic Date Due   Zoster Vaccines- Shingrix (1 of 2) Never done - can get at pharmacy, please let us know when you get this so that we can update it in your record.    INFLUENZA VACCINE  06/11/2023   DTaP/Tdap/Td (4 - Td or Tdap) 09/16/2023   MAMMOGRAM  09/25/2023   Medicare Annual Wellness (AWV)  02/10/2024   COLONOSCOPY (Pts 45-23yrs Insurance coverage will need to be confirmed)  07/13/2029   Pneumonia Vaccine 47+ Years old  Completed   DEXA SCAN  Completed   COVID-19 Vaccine  Completed   Hepatitis C Screening  Completed   HPV VACCINES  Aged Out    -See a dentist at least yearly  -Get your eyes checked and then per your eye specialist's recommendations  -Other issues addressed today:   -I have included below further information regarding a healthy whole foods based diet, physical activity guidelines for adults, stress management and opportunities for social connections. I hope you find this information useful.   -----------------------------------------------------------------------------------------------------------------------------------------------------------------------------------------------------------------------------------------------------------  NUTRITION: -eat real food: lots of colorful vegetables (half the plate) and fruits -5-7 servings of vegetables and fruits per day (fresh or steamed is best), exp. 2 servings of vegetables with lunch and dinner and 2 servings of fruit per day.  Berries and greens such as kale and collards are great choices.  -consume on a regular basis: whole grains (make sure first ingredient on label contains the word "whole"), fresh fruits, fish, nuts, seeds, healthy oils (such as olive oil, avocado oil, grape seed oil) -Fait eat small amounts of dairy and lean meat on occasion, but avoid processed meats such as ham, bacon, lunch meat, etc. -drink water -try to avoid fast food and pre-packaged foods, processed meat -most experts advise limiting sodium to < 2300mg  per day, should limit further is any chronic conditions such as high blood pressure, heart disease, diabetes, etc. The American Heart Association advised that < 1500mg  is is ideal -try to avoid foods that contain any ingredients with names you do not recognize  -try to avoid sugar/sweets (except for the natural sugar that occurs in fresh fruit) -try to avoid sweet drinks -try to avoid white rice, white bread, pasta (unless whole grain), white or yellow potatoes  EXERCISE GUIDELINES FOR ADULTS: -if you wish to increase your physical activity, do so gradually and with the approval of your doctor -STOP and seek medical care immediately if you have any chest pain, chest discomfort or trouble breathing when starting or increasing exercise  -move and stretch your body, legs, feet and arms when sitting for long periods -Physical activity guidelines for optimal health in adults: -least 150 minutes per week of aerobic exercise (can talk, but not sing) once approved by your doctor, 20-30 minutes of sustained activity or two 10 minute episodes of sustained activity every day.  -resistance training at least 2 days per week if approved by your doctor -balance exercises 3+ days per week:   Stand somewhere where  you have something sturdy to hold onto if you lose balance.    1) lift up on toes, start with 5x per day and work up to 20x   2) stand and lift on leg straight out to the side so that foot is a few  inches of the floor, start with 5x each side and work up to 20x each side   3) stand on one foot, start with 5 seconds each side and work up to 20 seconds on each side  If you need ideas or help with getting more active:  -Silver sneakers https://tools.silversneakers.com  -Walk with a Doc: http://stephens-thompson.biz/  -try to include resistance (weight lifting/strength building) and balance exercises twice per week: or the following link for ideas: ChessContest.fr  UpdateClothing.com.cy  STRESS MANAGEMENT: -can try meditating, or just sitting quietly with deep breathing while intentionally relaxing all parts of your body for 5 minutes daily -if you need further help with stress, anxiety or depression please follow up with your primary doctor or contact the wonderful folks at [place]: [place]: -options in [place] if you wish to engage in more social and exercise related activities:  -Silver sneakers https://tools.silversneakers.com  -Walk with a Doc: http://stephens-thompson.biz/  -Check out the [place] 50+ section on the [place] of [employer] (hiking clubs, book clubs, cards and games, chess, exercise classes, aquatic classes and much more) - see the website for details: https://www.[place].gov/departments/parks-recreation/active-adults50  -YouTube has lots of exercise videos for different ages and abilities as well  -[place] (a variety of indoor and outdoor inperson activities for adults). [phone]. [address]  -Virtual Online Classes (a variety of topics): see seniorplanet.org or call [phone]  -consider volunteering at a school, hospice center, church, senior center or elsewhere

## 2023-02-16 ENCOUNTER — Other Ambulatory Visit (INDEPENDENT_AMBULATORY_CARE_PROVIDER_SITE_OTHER): Payer: Medicare Other

## 2023-02-16 DIAGNOSIS — I10 Essential (primary) hypertension: Secondary | ICD-10-CM

## 2023-02-16 DIAGNOSIS — Z0000 Encounter for general adult medical examination without abnormal findings: Secondary | ICD-10-CM

## 2023-02-16 LAB — CBC WITH DIFFERENTIAL/PLATELET
Basophils Absolute: 0.1 10*3/uL (ref 0.0–0.1)
Basophils Relative: 1.2 % (ref 0.0–3.0)
Eosinophils Absolute: 0.2 10*3/uL (ref 0.0–0.7)
Eosinophils Relative: 2.9 % (ref 0.0–5.0)
HCT: 40.4 % (ref 36.0–46.0)
Hemoglobin: 13.8 g/dL (ref 12.0–15.0)
Lymphocytes Relative: 29.3 % (ref 12.0–46.0)
Lymphs Abs: 1.5 10*3/uL (ref 0.7–4.0)
MCHC: 34.2 g/dL (ref 30.0–36.0)
MCV: 93.4 fl (ref 78.0–100.0)
Monocytes Absolute: 0.5 10*3/uL (ref 0.1–1.0)
Monocytes Relative: 9.6 % (ref 3.0–12.0)
Neutro Abs: 2.9 10*3/uL (ref 1.4–7.7)
Neutrophils Relative %: 57 % (ref 43.0–77.0)
Platelets: 316 10*3/uL (ref 150.0–400.0)
RBC: 4.32 Mil/uL (ref 3.87–5.11)
RDW: 13.2 % (ref 11.5–15.5)
WBC: 5.2 10*3/uL (ref 4.0–10.5)

## 2023-02-16 LAB — COMPREHENSIVE METABOLIC PANEL
ALT: 15 U/L (ref 0–35)
AST: 20 U/L (ref 0–37)
Albumin: 4.5 g/dL (ref 3.5–5.2)
Alkaline Phosphatase: 68 U/L (ref 39–117)
BUN: 12 mg/dL (ref 6–23)
CO2: 29 mEq/L (ref 19–32)
Calcium: 10.1 mg/dL (ref 8.4–10.5)
Chloride: 98 mEq/L (ref 96–112)
Creatinine, Ser: 0.67 mg/dL (ref 0.40–1.20)
GFR: 87.85 mL/min (ref >60.00)
Glucose, Bld: 91 mg/dL (ref 70–99)
Potassium: 4.4 mEq/L (ref 3.5–5.1)
Sodium: 136 mEq/L (ref 135–145)
Total Bilirubin: 1.7 mg/dL — ABNORMAL HIGH (ref 0.2–1.2)
Total Protein: 6.8 g/dL (ref 6.0–8.3)

## 2023-02-16 LAB — LIPID PANEL
Cholesterol: 192 mg/dL (ref 0–200)
HDL: 90.9 mg/dL (ref >39.00)
LDL Cholesterol: 90 mg/dL (ref 0–99)
NonHDL: 101.19
Total CHOL/HDL Ratio: 2
Triglycerides: 58 mg/dL (ref 0.0–149.0)
VLDL: 11.6 mg/dL (ref 0.0–40.0)

## 2023-02-16 LAB — TSH: TSH: 2.25 u[IU]/mL (ref 0.35–5.50)

## 2023-02-23 ENCOUNTER — Ambulatory Visit (INDEPENDENT_AMBULATORY_CARE_PROVIDER_SITE_OTHER): Payer: Medicare Other | Admitting: Family Medicine

## 2023-02-23 ENCOUNTER — Encounter: Payer: Self-pay | Admitting: Family Medicine

## 2023-02-23 VITALS — BP 130/76 | HR 54 | Temp 97.7°F | Ht 62.75 in | Wt 110.5 lb

## 2023-02-23 DIAGNOSIS — Z2989 Encounter for other specified prophylactic measures: Secondary | ICD-10-CM

## 2023-02-23 DIAGNOSIS — I10 Essential (primary) hypertension: Secondary | ICD-10-CM | POA: Diagnosis not present

## 2023-02-23 DIAGNOSIS — J301 Allergic rhinitis due to pollen: Secondary | ICD-10-CM

## 2023-02-23 DIAGNOSIS — Z1211 Encounter for screening for malignant neoplasm of colon: Secondary | ICD-10-CM

## 2023-02-23 DIAGNOSIS — M818 Other osteoporosis without current pathological fracture: Secondary | ICD-10-CM | POA: Diagnosis not present

## 2023-02-23 DIAGNOSIS — S61212A Laceration without foreign body of right middle finger without damage to nail, initial encounter: Secondary | ICD-10-CM | POA: Diagnosis not present

## 2023-02-23 DIAGNOSIS — Z23 Encounter for immunization: Secondary | ICD-10-CM

## 2023-02-23 DIAGNOSIS — S61219A Laceration without foreign body of unspecified finger without damage to nail, initial encounter: Secondary | ICD-10-CM | POA: Insufficient documentation

## 2023-02-23 MED ORDER — FLUTICASONE PROPIONATE 50 MCG/ACT NA SUSP: 2.0000 | Freq: Every day | NASAL | 11 refills | Status: AC

## 2023-02-23 MED ORDER — OLMESARTAN MEDOXOMIL 20 MG PO TABS: 20.0000 mg | ORAL_TABLET | Freq: Every day | ORAL | 3 refills | Status: DC

## 2023-02-23 NOTE — Assessment & Plan Note (Signed)
Pt travels to [place] frequently  Usually feels bad day 2-3 with dizzy /nausea Acetazolamide works well Disc use short term prn  Disc imp of hydration before/during

## 2023-02-23 NOTE — Assessment & Plan Note (Signed)
Recommend flonase in season  Add antihistamine if needed for rhinorrhea or sneezing

## 2023-02-23 NOTE — Assessment & Plan Note (Signed)
Colonoscopy 07/2019  Up to date

## 2023-02-23 NOTE — Assessment & Plan Note (Signed)
Right middle finger  Td updated today  Enc to keep clean with soap /water  Cover to protect prn

## 2023-02-23 NOTE — Progress Notes (Signed)
Subjective:    Patient ID: Karen Rice, female    DOB: 07/18/1951, 72 y.o.   MRN: [id number]  HPI Pt presents for annual f/u of chronic medical problems  Also cut on finger   Wt Readings from Last 3 Encounters:  02/23/23 110 lb 8 oz (50.1 kg)  11/21/22 114 lb (51.7 kg)  08/27/22 114 lb (51.7 kg)   19.73 kg/m  Vitals:   02/23/23 0954  BP: 130/76  Pulse: (!) 54  Temp: 97.7 F (36.5 C)  SpO2: 98%    Has been traveling /hiking  Gets altitude sickness  Has a friend in [place] - lives in high altitude  Wakes up first am with nausea and headache and then off balance  She takes acetazolamide prn  [place] go there part of the year   Some allergy symptoms now  Ears are full  A little off balance   Feeling good in general   Cut finger yesterday  Opening a screen on porch  Cleaned and neo sporin     Immunization History  Administered Date(s) Administered   COVID-19, mRNA, vaccine(Comirnaty)12 years and older 10/07/2022   Fluad Quad(high Dose 65+) 08/16/2020   Hep A / Hep B 12/06/2004, 01/10/2005, 06/13/2005   Influenza Split 10/20/2011, 07/26/2012   Influenza Whole 08/14/2010   Influenza, High Dose Seasonal PF 08/26/2017, 09/07/2018, 08/20/2021   Influenza,inj,Quad PF,6+ Mos 08/31/2013, 09/08/2014, 09/18/2015, 10/06/2016, 07/26/2019, 08/20/2021, 08/27/2022   Moderna Sars-Covid-2 Vaccination 01/02/2020, 02/14/2020, 09/29/2020   Pneumococcal Conjugate-13 11/12/2016   Pneumococcal Polysaccharide-23 09/15/2019   Td 11/11/1999, 07/18/2010   Tdap 09/15/2013   Zoster, Live 03/23/2013   Health Maintenance Due  Topic Date Due   Zoster Vaccines- Shingrix (1 of 2) Never done   Last Tdap 09/2013   Mammogram 09/2022 Self breast exam  Sees gyn  Up to date with visits   Considering shingrix   Colonoscopy 07/2019   Dexa 01/2022 - OP Intol fo boniva in past (nausea) Would rather avoid oral bisphosphonate  Falls- none Fractures-none  Supplements - vitamin D / balanced diet   Exercise -hiking and walking and weight training  Very good about that    HTN bp is stable today  No cp or palpitations or headaches or edema  No side effects to medicines  BP Readings from Last 3 Encounters:  02/23/23 130/76  11/21/22 (!) 158/82  08/27/22 (!) 142/70    Benicar 20mg  daily   Last metabolic panel Lab Results  Component Value Date   GLUCOSE 91 02/16/2023   NA 136 02/16/2023   K 4.4 02/16/2023   CL 98 02/16/2023   CO2 29 02/16/2023   BUN 12 02/16/2023   CREATININE 0.67 02/16/2023   GFRNONAA >60 10/29/2018   CALCIUM 10.1 02/16/2023   PROT 6.8 02/16/2023   ALBUMIN 4.5 02/16/2023   BILITOT 1.7 (H) 02/16/2023   ALKPHOS 68 02/16/2023   AST 20 02/16/2023   ALT 15 02/16/2023   ANIONGAP 9 10/29/2018   GFR is 87.8  Cholesterol Lab Results  Component Value Date   CHOL 192 02/16/2023   CHOL 180 02/07/2022   CHOL 200 01/31/2021   Lab Results  Component Value Date   HDL 90.90 02/16/2023   HDL 90.20 02/07/2022   HDL 92.70 01/31/2021   Lab Results  Component Value Date   LDLCALC 90 02/16/2023   LDLCALC 80 02/07/2022   LDLCALC 96 01/31/2021   Lab Results  Component Value Date   TRIG 58.0 02/16/2023   TRIG 51.0 02/07/2022  TRIG 57.0 01/31/2021   Lab Results  Component Value Date   CHOLHDL 2 02/16/2023   CHOLHDL 2 02/07/2022   CHOLHDL 2 01/31/2021   Lab Results  Component Value Date   LDLDIRECT 94.6 07/19/2012   LDLDIRECT 89.7 12/26/2008    Eats well  Avoids fatty foods and processed foods      Other labs Lab Results  Component Value Date   TSH 2.25 02/16/2023   Lab Results  Component Value Date   WBC 5.2 02/16/2023   HGB 13.8 02/16/2023   HCT 40.4 02/16/2023   MCV 93.4 02/16/2023   PLT 316.0 02/16/2023    Patient Active Problem List   Diagnosis Date Noted   Laceration of finger 02/23/2023   Anal fissure 11/25/2022   Family history of sudden cardiac death 2021-03-17   Colon cancer screening 12/02/2018   Genetic testing  08/26/2018   Family history of breast cancer    Family history of uterine cancer    Hypertension 03/03/2011   Routine general medical examination at a health care facility 03/03/2011   Allergic rhinitis 07/21/2007   Age-related osteoporosis without fracture 07/21/2007   Past Medical History:  Diagnosis Date   Allergy    allergic rhinitis   Family history of breast cancer    Family history of uterine cancer    Gilbert's syndrome    with high bilirubin   Hypertension    Osteoporosis    Past Surgical History:  Procedure Laterality Date   TONSILLECTOMY AND ADENOIDECTOMY  11/11/1955   Social History   Tobacco Use   Smoking status: Former    Types: Cigarettes    Quit date: 11/10/2000    Years since quitting: 22.3   Smokeless tobacco: Never  Vaping Use   Vaping Use: Never used  Substance Use Topics   Alcohol use: Yes    Alcohol/week: 5.0 standard drinks of alcohol    Types: 5 Glasses of wine per week    Comment: Wine   Drug use: No   Family History  Problem Relation Age of Onset   Heart disease Mother        heart attack   Hypertension Mother    Breast cancer Mother        ages 61 and 58   Cancer Mother    Hypertension Father    Alcohol abuse Father    Heart disease Maternal Grandfather        MI   Hypertension Sister    Osteopenia Sister    Lung cancer Paternal Uncle        heavy smoker   Heart disease Maternal Aunt    Dementia Paternal Grandmother    Uterine cancer Cousin        dx in her 52s   Uterine cancer Other        MGM's maternal aunt   Allergies  Allergen Reactions   Boniva [Ibandronic Acid] Nausea Only   Lisinopril Other (See Comments)    Severe fatigue   Norvasc [Amlodipine Besylate]     Fatigue/ depression   Current Outpatient Medications on File Prior to Visit  Medication Sig Dispense Refill   acetaZOLAMIDE (DIAMOX) 125 MG tablet Take 1 pill by mouth twice daily for 1 day before climb through the duration of climb 20 tablet 0   AMBULATORY  NON FORMULARY MEDICATION Medication Name: Nitroglycerin ointment 0.125% Use pea sized amount per rectum 3 times a day for 4-8 weeks 30 g 1   Ascorbic Acid (VITAMIN C) 1000  MG tablet Take 1,000 mg by mouth daily.     Multiple Vitamin (MULTIVITAMIN) capsule Take 1 capsule by mouth daily.     tretinoin (RETIN-A) 0.05 % cream Uses seasonally     triamcinolone cream (KENALOG) 0.1 % Apply 1 Application topically 2 (two) times daily. Can use for up to 7 days. 30 g 0   vitamin B-12 (CYANOCOBALAMIN) 500 MCG tablet Take 500 mcg by mouth daily.     VITAMIN D PO Take 3,000 Units by mouth.     No current facility-administered medications on file prior to visit.      Review of Systems  Constitutional:  Negative for activity change, appetite change, fatigue, fever and unexpected weight change.  HENT:  Negative for congestion, ear pain, rhinorrhea, sinus pressure and sore throat.   Eyes:  Negative for pain, redness and visual disturbance.  Respiratory:  Negative for cough, shortness of breath and wheezing.   Cardiovascular:  Negative for chest pain and palpitations.  Gastrointestinal:  Negative for abdominal pain, blood in stool, constipation and diarrhea.  Endocrine: Negative for polydipsia and polyuria.  Genitourinary:  Negative for dysuria, frequency and urgency.  Musculoskeletal:  Negative for arthralgias, back pain and myalgias.  Skin:  Negative for pallor and rash.       Injured R middle finger   Allergic/Immunologic: Negative for environmental allergies.  Neurological:  Negative for dizziness, syncope and headaches.  Hematological:  Negative for adenopathy. Does not bruise/bleed easily.  Psychiatric/Behavioral:  Negative for decreased concentration and dysphoric mood. The patient is not nervous/anxious.        Objective:   Physical Exam Constitutional:      General: She is not in acute distress.    Appearance: Normal appearance. She is well-developed and normal weight. She is not  ill-appearing or diaphoretic.  HENT:     Head: Normocephalic and atraumatic.     Right Ear: Tympanic membrane, ear canal and external ear normal.     Left Ear: Tympanic membrane, ear canal and external ear normal.     Nose: Nose normal. No congestion.     Mouth/Throat:     Mouth: Mucous membranes are moist.     Pharynx: Oropharynx is clear. No posterior oropharyngeal erythema.  Eyes:     General: No scleral icterus.    Extraocular Movements: Extraocular movements intact.     Conjunctiva/sclera: Conjunctivae normal.     Pupils: Pupils are equal, round, and reactive to light.  Neck:     Thyroid: No thyromegaly.     Vascular: No carotid bruit or JVD.  Cardiovascular:     Rate and Rhythm: Normal rate and regular rhythm.     Pulses: Normal pulses.     Heart sounds: Normal heart sounds.     No gallop.  Pulmonary:     Effort: Pulmonary effort is normal. No respiratory distress.     Breath sounds: Normal breath sounds. No wheezing.     Comments: Good air exch Chest:     Chest wall: No tenderness.  Abdominal:     General: Bowel sounds are normal. There is no distension or abdominal bruit.     Palpations: Abdomen is soft. There is no mass.     Tenderness: There is no abdominal tenderness.     Hernia: No hernia is present.  Genitourinary:    Comments: Breast and pelvic exam done by gyn    Musculoskeletal:        General: No tenderness. Normal range of motion.  Cervical back: Normal range of motion and neck supple. No rigidity. No muscular tenderness.     Right lower leg: No edema.     Left lower leg: No edema.     Comments: No kyphosis   Lymphadenopathy:     Cervical: No cervical adenopathy.  Skin:    General: Skin is warm and dry.     Coloration: Skin is not pale.     Findings: No erythema or rash.     Comments: Solar lentigines diffusely    Neurological:     Mental Status: She is alert. Mental status is at baseline.     Cranial Nerves: No cranial nerve deficit.      Motor: No abnormal muscle tone.     Coordination: Coordination normal.     Gait: Gait normal.     Deep Tendon Reflexes: Reflexes are normal and symmetric. Reflexes normal.  Psychiatric:        Mood and Affect: Mood normal.        Cognition and Memory: Cognition and memory normal.           Assessment & Plan:   Problem List Items Addressed This Visit       Cardiovascular and Mediastinum   Hypertension - Primary    bp in fair control at this time  BP Readings from Last 1 Encounters:  02/23/23 130/76  No changes needed Most recent labs reviewed  Disc lifstyle change with low sodium diet and exercise  Plan to continue benicar 20 mg daily      Relevant Medications   olmesartan (BENICAR) 20 MG tablet     Respiratory   Allergic rhinitis    Recommend flonase in season  Add antihistamine if needed for rhinorrhea or sneezing         Musculoskeletal and Integument   Age-related osteoporosis without fracture    Dexa 01/2022 Wants to avoid bisphosphonates  No falls or fx  Very active / lots of wt bearing/strength training  Enc vit D and balanced diet          Other   Altitude sickness preventative measures    Pt [place] to [place] frequently  Usually feels bad day 2-3 with dizzy /nausea Acetazolamide works well Disc use short term prn  Disc imp of hydration before/during         Colon cancer screening    Colonoscopy 07/2019  Up to date       Laceration of finger    Right middle finger  Td updated today  Enc to keep clean with soap /water  Cover to protect prn      Relevant Orders   Td : Tetanus/diphtheria >7yo Preservative  free (Completed)

## 2023-02-23 NOTE — Patient Instructions (Addendum)
For allergies  Steroid nasal spray -daily Add antihistamine when needed (runny nose and sneezing) - allegra, claritin, zyrtec , xyzal   Avoid D products    Keep exercising  Also strength training   Tetanus shot today

## 2023-02-23 NOTE — Assessment & Plan Note (Signed)
bp in fair control at this time  BP Readings from Last 1 Encounters:  02/23/23 130/76   No changes needed Most recent labs reviewed  Disc lifstyle change with low sodium diet and exercise  Plan to continue benicar 20 mg daily

## 2023-02-23 NOTE — Assessment & Plan Note (Signed)
Dexa 01/2022 Wants to avoid bisphosphonates  No falls or fx  Very active / lots of wt bearing/strength training  Enc vit D and balanced diet

## 2023-03-05 NOTE — Progress Notes (Signed)
Karen Rice    [id number]    Jun 29, 1951  Primary Care Physician:Tower, Audrie Gallus, MD  Referring Physician: Tower, Audrie Gallus, MD [address] [place],  [place] [address]   Chief complaint:  Anal fissure Chief Complaint  Patient presents with   Anal Fissure    Patient states she has not any rectal pain and her bleeding stopped 6 months ago. Patient denies any GI sx.    HPI: 72 year old very pleasant female here for follow-up visit for anal fissure.  I last saw her on 08-06-22. At that time, she had intermittent stinging sensation in her anorectal area and was using nitroglycerin with some improvements.   Today, she reports feeling well overall. She denies having any discomfort and states that her rectal pain and bleeding symptoms have resolved. She states that she occasionally will experience dryness in her rectal area. She also reports occasionally experiencing discomfort when having a BM after eating acidic foods such as tomatoes. She reports typically having a BM once daily.  She continues to hike and states she started wearing rubber pants when kayaking.   she denies diarrhea, constipation, nausea, blood in stool, black stool, vomiting, abdominal pain, bloating, unintentional weight loss, reflux, dysphagia.  GI Hx:  Colonoscopy July 14, 2019: - Diverticulosis in the sigmoid colon and in the descending colon. - Non-bleeding internal hemorrhoids.  CT Abdomen Pelvis w contrast 10-29-18 1. Diffuse wall thickening along the descending and proximal sigmoid colon, concerning for an acute infectious or inflammatory colitis. Surrounding soft tissue inflammation noted. 2. Scattered diverticulosis extends along the descending and sigmoid colon  Colonoscopy September 24, 2006 by Dr. Juanda Chance normal exam, was recommended 10-year recall.   Current Outpatient Medications:    acetaZOLAMIDE (DIAMOX) 125 MG tablet, Take 1 pill by mouth twice daily for 1 day before climb  through the duration of climb, Disp: 20 tablet, Rfl: 0   Ascorbic Acid (VITAMIN C) 1000 MG tablet, Take 1,000 mg by mouth daily., Disp: , Rfl:    fluticasone (FLONASE) 50 MCG/ACT nasal spray, Place 2 sprays into both nostrils daily., Disp: 16 g, Rfl: 11   magnesium (MAGTAB) 84 MG ( ) TBCR SR tablet, Take 84 mg by mouth daily., Disp: , Rfl:    Multiple Vitamin (MULTIVITAMIN) capsule, Take 1 capsule by mouth daily., Disp: , Rfl:    olmesartan (BENICAR) 20 MG tablet, Take 1 tablet (20 mg total) by mouth daily., Disp: 90 tablet, Rfl: 3   tretinoin (RETIN-A) 0.05 % cream, Uses seasonally, Disp: , Rfl:    triamcinolone cream (KENALOG) 0.1 %, Apply 1 Application topically 2 (two) times daily. Can use for up to 7 days., Disp: 30 g, Rfl: 0   vitamin B-12 (CYANOCOBALAMIN) 500 MCG tablet, Take 500 mcg by mouth daily., Disp: , Rfl:    VITAMIN D PO, Take 3,000 Units by mouth., Disp: , Rfl:     Allergies as of 03/11/2023 - Review Complete 03/11/2023  Allergen Reaction Noted   Boniva [ibandronic acid] Nausea Only 02/21/2013   Lisinopril Other (See Comments) 04/14/2011   Norvasc [amlodipine besylate]  11/12/2011    Past Medical History:  Diagnosis Date   Allergy    allergic rhinitis   Family history of breast cancer    Family history of uterine cancer    Gilbert's syndrome    with high bilirubin   Hypertension    Osteoporosis     Past Surgical History:  Procedure Laterality Date  TONSILLECTOMY AND ADENOIDECTOMY  11/11/1955    Family History  Problem Relation Age of Onset   Heart disease Mother        heart attack   Hypertension Mother    Breast cancer Mother        ages 34 and 37   Cancer Mother    Hypertension Father    Alcohol abuse Father    Heart disease Maternal Grandfather        MI   Hypertension Sister    Osteopenia Sister    Lung cancer Paternal Uncle        heavy smoker   Heart disease Maternal Aunt    Dementia Paternal Grandmother    Uterine cancer Cousin         dx in her 57s   Uterine cancer Other        MGM's maternal aunt    Social History   Socioeconomic History   Marital status: Divorced    Spouse name: Not on file   Number of children: Not on file   Years of education: Not on file   Highest education level: Not on file  Occupational History   Not on file  Tobacco Use   Smoking status: Former    Types: Cigarettes    Quit date: 11/10/2000    Years since quitting: 22.3   Smokeless tobacco: Never  Vaping Use   Vaping Use: Never used  Substance and Sexual Activity   Alcohol use: Yes    Alcohol/week: 5.0 standard drinks of alcohol    Types: 5 Glasses of wine per week    Comment: Wine   Drug use: No   Sexual activity: Not Currently    Partners: Female    Birth control/protection: Post-menopausal  Other Topics Concern   Not on file  Social History Narrative   Partner died last year.   3 children, daughter, who lives in [place], son in [place] and son in [place].   Social Determinants of Health   Financial Resource Strain: Low Risk  (02/03/2022)   Overall Financial Resource Strain (CARDIA)    Difficulty of Paying Living Expenses: Not hard at all  Food Insecurity: No Food Insecurity (02/03/2022)   Hunger Vital Sign    Worried About Running Out of Food in the Last Year: Never true    Ran Out of Food in the Last Year: Never true  Transportation Needs: No Transportation Needs (02/03/2022)   PRAPARE - Administrator, Civil Service (Medical): No    Lack of Transportation (Non-Medical): No  Physical Activity: Sufficiently Active (02/03/2022)   Exercise Vital Sign    Days of Exercise per Week: 5 days    Minutes of Exercise per Session: 60 min  Stress: No Stress Concern Present (02/03/2022)   [employer] of Occupational Health - Occupational Stress Questionnaire    Feeling of Stress : Not at all  Social Connections: Moderately Integrated (02/03/2022)   Social Connection and Isolation Panel [NHANES]    Frequency of  Communication with Friends and Family: More than three times a week    Frequency of Social Gatherings with Friends and Family: More than three times a week    Attends Religious Services: More than 4 times per year    Active Member of [employer] or Organizations: Yes    Attends Banker Meetings: More than 4 times per year    Marital Status: Divorced  Intimate Partner Violence: Not At Risk (02/03/2022)   Humiliation,  Afraid, Rape, and Kick questionnaire    Fear of Current or Ex-Partner: No    Emotionally Abused: No    Physically Abused: No    Sexually Abused: No      Review of systems: Review of Systems  Constitutional:  Negative for unexpected weight change.  HENT:  Negative for trouble swallowing.   Gastrointestinal:  Negative for abdominal distention, abdominal pain, anal bleeding, blood in stool, constipation, diarrhea, nausea, rectal pain and vomiting.    Physical Exam: General: well-appearing   Eyes: sclera anicteric, no redness ENT: oral mucosa moist without lesions, no cervical or supraclavicular lymphadenopathy CV: RRR, no JVD, no peripheral edema Resp: clear to auscultation bilaterally, normal RR and effort noted GI: soft, no tenderness, with active bowel sounds. No guarding or palpable organomegaly noted. Skin; warm and dry, no rash or jaundice noted Neuro: awake, alert and oriented x 3. Normal gross motor function and fluent speech   Data Reviewed:  Reviewed labs, radiology imaging, old records and pertinent past GI work up   Assessment and Plan/Recommendations:  72 year old very pleasant female with complaints of intermittent rectal irritation and stinging sensation secondary to anal fissure, has since resolved Advised patient to use barrier cream to avoid excessive moisture in the perianal area especially when she is kayaking Use miconazole 2% twice daily for 3 to 4 weeks for perianal rash/tinea cruris Use nitroglycerin 0.125% up to 3 times daily as  needed if has recurrent symptoms concerning for anal fissure Use Benefiber 1 tablespoon twice daily as needed   Return in 1 year   The patient was provided an opportunity to ask questions and all were answered. The patient agreed with the plan and demonstrated an understanding of the instructions.  Iona Beard , MD    CC: Tower, Audrie Gallus, MD   Ladona Mow Hewitt Shorts as a scribe for Marsa Aris, MD.,have documented all relevant documentation on the behalf of Marsa Aris, MD,as directed by  Marsa Aris, MD while in the presence of Marsa Aris, MD.   I, Marsa Aris, MD, have reviewed all documentation for this visit. The documentation on 03/11/23 for the exam, diagnosis, procedures, and orders are all accurate and complete.

## 2023-03-11 ENCOUNTER — Ambulatory Visit (INDEPENDENT_AMBULATORY_CARE_PROVIDER_SITE_OTHER): Payer: Medicare Other | Admitting: Gastroenterology

## 2023-03-11 ENCOUNTER — Encounter: Payer: Self-pay | Admitting: Gastroenterology

## 2023-03-11 VITALS — BP 148/68 | HR 85 | Ht 63.5 in | Wt 113.0 lb

## 2023-03-11 DIAGNOSIS — B359 Dermatophytosis, unspecified: Secondary | ICD-10-CM | POA: Diagnosis not present

## 2023-03-11 DIAGNOSIS — Z8719 Personal history of other diseases of the digestive system: Secondary | ICD-10-CM | POA: Diagnosis not present

## 2023-03-11 MED ORDER — MICONAZOLE NITRATE 2 % EX CREA
1.0000 | TOPICAL_CREAM | Freq: Two times a day (BID) | CUTANEOUS | 1 refills | Status: DC
Start: 2023-03-11 — End: 2024-02-19

## 2023-03-11 NOTE — Patient Instructions (Addendum)
We have sent the following medications to your pharmacy for you to pick up at your convenience:  Miconazole  Use benefiber twice daily as needed   _______________________________________________________  If your blood pressure at your visit was 140/90 or greater, please contact your primary care physician to follow up on this.  _______________________________________________________  If you are age 72 or older, your body mass index should be between 23-30. Your Body mass index is 19.7 kg/m. If this is out of the aforementioned range listed, please consider follow up with your Primary Care Provider.  If you are age 34 or younger, your body mass index should be between 19-25. Your Body mass index is 19.7 kg/m. If this is out of the aformentioned range listed, please consider follow up with your Primary Care Provider.   ________________________________________________________  The [facility] providers would like to encourage you to use [facility] to communicate with providers for non-urgent requests or questions.  Due to long hold times on the telephone, sending your provider a message by [facility] be a faster and more efficient way to get a response.  Please allow 48 business hours for a response.  Please remember that this is for non-urgent requests.  _______________________________________________________   I appreciate the  opportunity to care for you  Thank You   Marsa Aris , MD

## 2023-03-18 ENCOUNTER — Encounter: Payer: Self-pay | Admitting: Gastroenterology

## 2023-04-19 ENCOUNTER — Encounter: Payer: Self-pay | Admitting: Family Medicine

## 2023-04-20 MED ORDER — ACETAZOLAMIDE 125 MG PO TABS: ORAL_TABLET | ORAL | 0 refills | Status: DC

## 2023-04-21 ENCOUNTER — Telehealth: Payer: Self-pay

## 2023-04-21 NOTE — Patient Outreach (Signed)
  Care Coordination   04/21/2023 Name: Tenille Morrill Schuff MRN: [id number] DOB: Feb 26, 1951   Care Coordination Outreach Attempts:  Successful contact made with patient.  Patient states she is not at home currently.  She states she will return call to [facility]  Patient has PCP office contact phone number.   Follow Up Plan:  Additional outreach attempts will be made to offer the patient care coordination information and services.   Encounter Outcome:  Pt. Request to Call Back   Care Coordination Interventions:  No, not indicated    George Ina [facility] Care Coordination [phone] direct line

## 2023-04-22 ENCOUNTER — Telehealth: Payer: Self-pay

## 2023-04-22 NOTE — Patient Outreach (Signed)
  Care Coordination   04/22/2023 Name: Karen Rice MRN: [id number] DOB: Feb 01, 1951   Care Coordination Outreach Attempts:  An unsuccessful telephone outreach was attempted today to offer the patient information about available care coordination services. HIPAA compliant message left  Follow Up Plan:  Additional outreach attempts will be made to offer the patient care coordination information and services.   Encounter Outcome:  No Answer   Care Coordination Interventions:  No, not indicated    George Ina [facility] Care Coordination [phone] direct line

## 2023-05-21 ENCOUNTER — Encounter: Payer: Self-pay | Admitting: Family Medicine

## 2023-05-21 NOTE — Telephone Encounter (Signed)
I spoke with pt; pt has not had diarrhea in about 36 hrs but lower abd still cramping especially after tries to eat something. pt has been following BRAT diet but this morning ate oatmeal and canteloupe and abd cramping was maybe 2 - 3. P no fever and no blood or mucus seen in BM. Since continuing for 1 wk pt scheduled appt with Dr Ermalene Searing (pt needed afternoon appt) 05/22/23 at 4 pm with UC & ED precautions and  pt voiced understanding.sending note to Dr Ermalene Searing.

## 2023-05-21 NOTE — Telephone Encounter (Signed)
Please triage since PCP isn't here

## 2023-05-21 NOTE — Telephone Encounter (Signed)
Noted  

## 2023-05-22 ENCOUNTER — Emergency Department (HOSPITAL_BASED_OUTPATIENT_CLINIC_OR_DEPARTMENT_OTHER)
Admission: EM | Admit: 2023-05-22 | Discharge: 2023-05-22 | Disposition: A | Discharge status: Home / Self Care | Payer: Medicare Other | Source: Home / Self Care | Attending: Emergency Medicine | Admitting: Emergency Medicine

## 2023-05-22 ENCOUNTER — Encounter (HOSPITAL_BASED_OUTPATIENT_CLINIC_OR_DEPARTMENT_OTHER): Payer: Self-pay | Admitting: Emergency Medicine

## 2023-05-22 ENCOUNTER — Ambulatory Visit: Payer: Medicare Other | Admitting: Family Medicine

## 2023-05-22 ENCOUNTER — Other Ambulatory Visit (HOSPITAL_BASED_OUTPATIENT_CLINIC_OR_DEPARTMENT_OTHER): Payer: Self-pay

## 2023-05-22 ENCOUNTER — Other Ambulatory Visit: Payer: Self-pay

## 2023-05-22 DIAGNOSIS — Z79899 Other long term (current) drug therapy: Secondary | ICD-10-CM | POA: Diagnosis not present

## 2023-05-22 DIAGNOSIS — R5383 Other fatigue: Secondary | ICD-10-CM | POA: Diagnosis not present

## 2023-05-22 DIAGNOSIS — E871 Hypo-osmolality and hyponatremia: Secondary | ICD-10-CM | POA: Diagnosis not present

## 2023-05-22 DIAGNOSIS — Z87891 Personal history of nicotine dependence: Secondary | ICD-10-CM | POA: Diagnosis not present

## 2023-05-22 DIAGNOSIS — I10 Essential (primary) hypertension: Secondary | ICD-10-CM | POA: Insufficient documentation

## 2023-05-22 DIAGNOSIS — R531 Weakness: Secondary | ICD-10-CM | POA: Diagnosis present

## 2023-05-22 LAB — BASIC METABOLIC PANEL
Anion gap: 11 (ref 5–15)
BUN: 10 mg/dL (ref 8–23)
CO2: 26 mmol/L (ref 22–32)
Calcium: 10.1 mg/dL (ref 8.9–10.3)
Chloride: 91 mmol/L — ABNORMAL LOW (ref 98–111)
Creatinine, Ser: 0.63 mg/dL (ref 0.44–1.00)
GFR, Estimated: >60 mL/min (ref >60)
Glucose, Bld: 104 mg/dL — ABNORMAL HIGH (ref 70–99)
Potassium: 4 mmol/L (ref 3.5–5.1)
Sodium: 128 mmol/L — ABNORMAL LOW (ref 135–145)

## 2023-05-22 LAB — CBC
HCT: 38.8 % (ref 36.0–46.0)
Hemoglobin: 13.7 g/dL (ref 12.0–15.0)
MCH: 32 pg (ref 26.0–34.0)
MCHC: 35.3 g/dL (ref 30.0–36.0)
MCV: 90.7 fL (ref 80.0–100.0)
Platelets: 345 10*3/uL (ref 150–400)
RBC: 4.28 MIL/uL (ref 3.87–5.11)
RDW: 12.5 % (ref 11.5–15.5)
WBC: 6.3 10*3/uL (ref 4.0–10.5)
nRBC: 0 % (ref 0.0–0.2)

## 2023-05-22 LAB — HEPATIC FUNCTION PANEL
ALT: 19 U/L (ref 0–44)
AST: 23 U/L (ref 15–41)
Albumin: 4.7 g/dL (ref 3.5–5.0)
Alkaline Phosphatase: 89 U/L (ref 38–126)
Bilirubin, Direct: 0.2 mg/dL (ref 0.0–0.2)
Indirect Bilirubin: 0.8 mg/dL (ref 0.3–0.9)
Total Bilirubin: 1 mg/dL (ref 0.3–1.2)
Total Protein: 7.3 g/dL (ref 6.5–8.1)

## 2023-05-22 LAB — MAGNESIUM: Magnesium: 1.9 mg/dL (ref 1.7–2.4)

## 2023-05-22 MED ORDER — SODIUM CHLORIDE 0.9 % IV BOLUS: 1000.0000 mL | Freq: Once | INTRAVENOUS | Status: DC

## 2023-05-22 MED ORDER — LACTATED RINGERS IV BOLUS
1000.0000 mL | Freq: Once | INTRAVENOUS | Status: AC
  Administered 2023-05-22: 1000 mL via INTRAVENOUS

## 2023-05-22 MED ORDER — SODIUM CHLORIDE 0.9 % IV BOLUS
500.0000 mL | Freq: Once | INTRAVENOUS | Status: AC
  Administered 2023-05-22: 500 mL via INTRAVENOUS

## 2023-05-22 NOTE — ED Triage Notes (Signed)
Pt arrives to ED with c/o weakness post intestinal infection since 7/4. Pt notes diarrhea has significantly improved but she feels weak/dehydrated.

## 2023-05-22 NOTE — Discharge Instructions (Addendum)
Your sodium today was low, this is likely secondary to recent diarrheal illness and poor oral intake.  Recommend continue to resume your typical diet.  Please add Gatorade or sports drink to your diet once a day over the next week.  Please follow-up with your primary care doctor for repeat labs in around 1 week.  It was a pleasure caring for you today in the emergency department.  Please return to the emergency department for any worsening or worrisome symptoms.

## 2023-05-22 NOTE — ED Provider Notes (Signed)
[place] EMERGENCY DEPARTMENT AT [facility] Provider Note  CSN: [id number] Arrival date & time: 05/22/23 0757  Chief Complaint(s) Weakness and Diarrhea  HPI Karen Rice Age is a 72 y.o. female with past medical history as below, significant for Sullivan Lone syndrome, hypertension, osteoporosis who presents to the ED with complaint of feeling tired.  Patient reports she recently recovered from a diarrheal illness, she had profuse diarrhea, poor p.o. intake.  She started having diarrhea abdominal pain/cramping.  She has started to resume p.o. intake, has felt fatigued, depleted since recovering from diarrheal illness.  Concerned that she is perhaps dehydrated and her electrolytes are low.  She has no ongoing abdominal pain, nausea, vomiting, diarrhea, chest pain or dyspnea, no falls weakness to extremities, no numbness, no headaches, no palpitations, no recent medication changes.  Past Medical History Past Medical History:  Diagnosis Date   Allergy    allergic rhinitis   Family history of breast cancer    Family history of uterine cancer    Gilbert's syndrome    with high bilirubin   Hypertension    Osteoporosis    Patient Active Problem List   Diagnosis Date Noted   Laceration of finger 02/23/2023   Altitude sickness preventative measures 02/23/2023   Anal fissure 11/25/2022   Family history of sudden cardiac death 03/08/2021   Colon cancer screening 12/02/2018   Genetic testing 08/26/2018   Family history of breast cancer    Family history of uterine cancer    Hypertension 03/03/2011   Routine general medical examination at a health care facility 03/03/2011   Allergic rhinitis 07/21/2007   Age-related osteoporosis without fracture 07/21/2007   Home Medication(s) Prior to Admission medications   Medication Sig Start Date End Date Taking? Authorizing Provider  acetaZOLAMIDE (DIAMOX) 125 MG tablet Take 1 pill by mouth twice daily for 1 day before climb through the duration of  climb 04/20/23   Tower, Audrie Gallus, MD  Ascorbic Acid (VITAMIN C) 1000 MG tablet Take 1,000 mg by mouth daily.    [provider]  fluticasone (FLONASE) 50 MCG/ACT nasal spray Place 2 sprays into both nostrils daily. 02/23/23   Tower, Audrie Gallus, MD  magnesium (MAGTAB) 84 MG ( ) TBCR SR tablet Take 84 mg by mouth daily.    [provider]  miconazole (MICATIN) 2 % cream Apply 1 Application topically 2 (two) times daily. 03/11/23   Napoleon Form, MD  Multiple Vitamin (MULTIVITAMIN) capsule Take 1 capsule by mouth daily.    [provider]  olmesartan (BENICAR) 20 MG tablet Take 1 tablet (20 mg total) by mouth daily. 02/23/23   Tower, Audrie Gallus, MD  tretinoin (RETIN-A) 0.05 % cream Uses seasonally 07/07/19   [provider]  triamcinolone cream (KENALOG) 0.1 % Apply 1 Application topically 2 (two) times daily. Can use for up to 7 days. 11/24/22   Jerene Bears, MD  vitamin B-12 (CYANOCOBALAMIN) 500 MCG tablet Take 500 mcg by mouth daily.    [provider]  VITAMIN D PO Take 3,000 Units by mouth.    [provider]  Past Surgical History Past Surgical History:  Procedure Laterality Date   TONSILLECTOMY AND ADENOIDECTOMY  11/11/1955   Family History Family History  Problem Relation Age of Onset   Heart disease Mother        heart attack   Hypertension Mother    Breast cancer Mother        ages 59 and 64   Cancer Mother    Hypertension Father    Alcohol abuse Father    Heart disease Maternal Grandfather        MI   Hypertension Sister    Osteopenia Sister    Lung cancer Paternal Uncle        heavy smoker   Heart disease Maternal Aunt    Dementia Paternal Grandmother    Uterine cancer Cousin        dx in her 102s   Uterine cancer Other        MGM's maternal aunt    Social History Social History    Tobacco Use   Smoking status: Former    Current packs/day: 0.00    Types: Cigarettes    Quit date: 11/10/2000    Years since quitting: 22.5   Smokeless tobacco: Never  Vaping Use   Vaping status: Never Used  Substance Use Topics   Alcohol use: Yes    Alcohol/week: 5.0 standard drinks of alcohol    Types: 5 Glasses of wine per week    Comment: Wine   Drug use: No   Allergies Boniva [ibandronic acid], Lisinopril, and Norvasc [amlodipine besylate]  Review of Systems Review of Systems  Constitutional:  Positive for fatigue.  HENT:  Negative for trouble swallowing.   Respiratory:  Negative for chest tightness and shortness of breath.   Cardiovascular:  Negative for chest pain and palpitations.  Gastrointestinal:  Negative for abdominal pain, nausea and vomiting.  Genitourinary:  Negative for difficulty urinating.  Musculoskeletal:  Negative for myalgias.  Skin:  Negative for rash.  All other systems reviewed and are negative.   Physical Exam Vital Signs  I have reviewed the triage vital signs BP (!) 148/84   Pulse 72   Temp 97.8 F (36.6 C) (Oral)   Resp 17   Ht 5' 3.5" (1.613 m)   Wt 49 kg   LMP 11/10/2002   SpO2 100%   BMI 18.83 kg/m  Physical Exam Vitals and nursing note reviewed.  Constitutional:      General: She is not in acute distress.    Appearance: Normal appearance. She is obese. She is not ill-appearing or diaphoretic.  HENT:     Head: Normocephalic and atraumatic.     Right Ear: External ear normal.     Left Ear: External ear normal.     Nose: Nose normal.     Mouth/Throat:     Mouth: Mucous membranes are moist.  Eyes:     General: No scleral icterus.       Right eye: No discharge.        Left eye: No discharge.  Cardiovascular:     Rate and Rhythm: Normal rate and regular rhythm.     Pulses: Normal pulses.     Heart sounds: Normal heart sounds.  Pulmonary:     Effort: Pulmonary effort is normal. No respiratory distress.     Breath sounds:  Normal breath sounds. No stridor.  Abdominal:     General: Abdomen is flat. There is no distension.     Palpations: Abdomen is soft.  Tenderness: There is no abdominal tenderness.  Musculoskeletal:     Cervical back: No rigidity.     Right lower leg: No edema.     Left lower leg: No edema.  Skin:    General: Skin is warm and dry.     Capillary Refill: Capillary refill takes less than 2 seconds.  Neurological:     Mental Status: She is alert.  Psychiatric:        Mood and Affect: Mood normal.        Behavior: Behavior normal. Behavior is cooperative.     ED Results and Treatments Labs (all labs ordered are listed, but only abnormal results are displayed) Labs Reviewed  BASIC METABOLIC PANEL - Abnormal; Notable for the following components:      Result Value   Sodium 128 (*)    Chloride 91 (*)    Glucose, Bld 104 (*)    All other components within normal limits  CBC  HEPATIC FUNCTION PANEL  MAGNESIUM                                                                                                                          Radiology No results found.  Pertinent labs & imaging results that were available during my care of the patient were reviewed by me and considered in my medical decision making (see MDM for details).  Medications Ordered in ED Medications  lactated ringers bolus 1,000 mL (0 mLs Intravenous Stopped 05/22/23 0941)  sodium chloride 0.9 % bolus 500 mL (500 mLs Intravenous New Bag/Given 05/22/23 4098)                                                                                                                                     Procedures Procedures  (including critical care time)  Medical Decision Making / ED Course    Medical Decision Making:    Alesha Krogmann Heeter is a 72 y.o. female  with past medical history as below, significant for Gilbert syndrome, hypertension, osteoporosis who presents to the ED with complaint of feeling tired.. The complaint  involves an extensive differential diagnosis and also carries with it a high risk of complications and morbidity.  Serious etiology was considered. Ddx includes but is not limited to: Dehydration, metabolic derangement, infectious, etc.  Complete initial physical exam performed, notably the patient  was no acute distress, sitting upright on stretcher,  abdomen soft, nonperitoneal.    Reviewed and confirmed nursing documentation for past medical history, family history, social history.  Vital signs reviewed.    Clinical Course as of 05/22/23 1020  Fri May 22, 2023  0929 Sodium(!): 128 Baseline around 136, continue IVF [SG]  0932 Creatinine: 0.63 [SG]    Clinical Course User Index [SG] Sloan Leiter, DO   Patient with fatigue, recent diarrheal illness which is since improved. Check screening labs, EKG, no chest pain.  Give IV fluids  Labs reviewed, her sodium is mildly depleted, baseline around 136.  Likely secondary to recent diarrheal illness, poor p.o. intake. Received IV fluids, she is feeling much better. Advised her to add Gatorade or sports drink to her diet daily over the next week. Follow-up with PCP for repeat metabolic panel in around a week Sodium will likely correct when she resumes her diet.  She is tolerating p.o. without difficulty Fatigue is likely secondary to recent diarrheal illness and hyponatremia  The patient improved significantly and was discharged in stable condition. Detailed discussions were had with the patient regarding current findings, and need for close f/u with PCP or on call doctor. The patient has been instructed to return immediately if the symptoms worsen in any way for re-evaluation. Patient verbalized understanding and is in agreement with current care plan. All questions answered prior to discharge.        Additional history obtained: -Additional history obtained from na -External records from outside source obtained and reviewed including:  Chart review including previous notes, labs, imaging, consultation notes including primary care recommendation, home medications   Lab Tests: -I ordered, reviewed, and interpreted labs.   The pertinent results include:   Labs Reviewed  BASIC METABOLIC PANEL - Abnormal; Notable for the following components:      Result Value   Sodium 128 (*)    Chloride 91 (*)    Glucose, Bld 104 (*)    All other components within normal limits  CBC  HEPATIC FUNCTION PANEL  MAGNESIUM    Notable for hyponatremia  EKG   EKG Interpretation Date/Time:  Friday May 22 2023 08:30:36 EDT Ventricular Rate:  73 PR Interval:  144 QRS Duration:  85 QT Interval:  385 QTC Calculation: 425 R Axis:   84  Text Interpretation: Sinus rhythm Borderline right axis deviation no prior no stemi Confirmed by Tanda Rockers ([id number]) on 05/22/2023 8:32:59 AM         Imaging Studies ordered: na   Medicines ordered and prescription drug management: Meds ordered this encounter  Medications   lactated ringers bolus 1,000 mL   DISCONTD: sodium chloride 0.9 % bolus 1,000 mL   DISCONTD: sodium chloride 0.9 % bolus 1,000 mL   sodium chloride 0.9 % bolus 500 mL    -I have reviewed the patients home medicines and have made adjustments as needed   Consultations Obtained: na   Cardiac Monitoring: The patient was maintained on a cardiac monitor.  I personally viewed and interpreted the cardiac monitored which showed an underlying rhythm of: NSR  Social Determinants of Health:  Diagnosis or treatment significantly limited by social determinants of health: former smoker   Reevaluation: After the interventions noted above, I reevaluated the patient and found that they have improved  Co morbidities that complicate the patient evaluation  Past Medical History:  Diagnosis Date   Allergy    allergic rhinitis   Family history of breast cancer    Family history of uterine cancer  Gilbert's syndrome    with high  bilirubin   Hypertension    Osteoporosis       Dispostion: Disposition decision including need for hospitalization was considered, and patient discharged from emergency department.    Final Clinical Impression(s) / ED Diagnoses Final diagnoses:  Other fatigue  Hyponatremia     This chart was dictated using voice recognition software.  Despite best efforts to proofread,  errors can occur which can change the documentation meaning.    Tanda Rockers A, DO 05/22/23 1020

## 2023-05-24 ENCOUNTER — Encounter: Payer: Self-pay | Admitting: Family Medicine

## 2023-05-25 ENCOUNTER — Encounter: Payer: Self-pay | Admitting: Family Medicine

## 2023-05-25 DIAGNOSIS — E871 Hypo-osmolality and hyponatremia: Secondary | ICD-10-CM | POA: Insufficient documentation

## 2023-05-28 ENCOUNTER — Other Ambulatory Visit (INDEPENDENT_AMBULATORY_CARE_PROVIDER_SITE_OTHER): Payer: Medicare Other

## 2023-05-28 DIAGNOSIS — E871 Hypo-osmolality and hyponatremia: Secondary | ICD-10-CM

## 2023-05-28 LAB — BASIC METABOLIC PANEL
BUN: 14 mg/dL (ref 6–23)
CO2: 28 mEq/L (ref 19–32)
Calcium: 10.4 mg/dL (ref 8.4–10.5)
Chloride: 93 mEq/L — ABNORMAL LOW (ref 96–112)
Creatinine, Ser: 0.69 mg/dL (ref 0.40–1.20)
GFR: 87.07 mL/min (ref >60.00)
Glucose, Bld: 92 mg/dL (ref 70–99)
Potassium: 5.3 mEq/L — ABNORMAL HIGH (ref 3.5–5.1)
Sodium: 129 mEq/L — ABNORMAL LOW (ref 135–145)

## 2023-05-29 ENCOUNTER — Other Ambulatory Visit: Payer: Medicare Other

## 2023-05-29 ENCOUNTER — Encounter: Payer: Self-pay | Admitting: Family Medicine

## 2023-05-29 NOTE — Telephone Encounter (Signed)
Will route to PCP to put lab orders in and also support pool to get f/u scheduled with PCP next week

## 2023-06-01 NOTE — Addendum Note (Signed)
Addended by: Roxy Manns A on: 06/01/2023 08:58 PM   Modules accepted: Orders

## 2023-06-04 ENCOUNTER — Other Ambulatory Visit (INDEPENDENT_AMBULATORY_CARE_PROVIDER_SITE_OTHER): Payer: Medicare Other

## 2023-06-04 ENCOUNTER — Encounter: Payer: Self-pay | Admitting: Family Medicine

## 2023-06-04 DIAGNOSIS — E871 Hypo-osmolality and hyponatremia: Secondary | ICD-10-CM

## 2023-06-04 LAB — BASIC METABOLIC PANEL
BUN: 14 mg/dL (ref 6–23)
CO2: 27 mEq/L (ref 19–32)
Calcium: 10 mg/dL (ref 8.4–10.5)
Chloride: 103 mEq/L (ref 96–112)
Creatinine, Ser: 0.87 mg/dL (ref 0.40–1.20)
GFR: 66.83 mL/min (ref >60.00)
Glucose, Bld: 101 mg/dL — ABNORMAL HIGH (ref 70–99)
Potassium: 4 mEq/L (ref 3.5–5.1)
Sodium: 138 mEq/L (ref 135–145)

## 2023-06-05 ENCOUNTER — Ambulatory Visit: Payer: Medicare Other | Admitting: Family Medicine

## 2023-06-05 ENCOUNTER — Encounter: Payer: Self-pay | Admitting: Family Medicine

## 2023-06-05 VITALS — BP 122/70 | HR 88 | Temp 97.7°F | Ht 63.5 in | Wt 108.5 lb

## 2023-06-05 DIAGNOSIS — Z2989 Encounter for other specified prophylactic measures: Secondary | ICD-10-CM

## 2023-06-05 DIAGNOSIS — E871 Hypo-osmolality and hyponatremia: Secondary | ICD-10-CM | POA: Diagnosis not present

## 2023-06-05 NOTE — Assessment & Plan Note (Signed)
Instructed to use caution with diamox due to recent hyponatremia (was most likely due to diarrhea)

## 2023-06-05 NOTE — Patient Instructions (Addendum)
Stay hydrated when traveling and use the electrolyte addition  Eat some extra salty food  Use diamox sparingly if needed   So glad you are feeling better   If symptoms return let us know   Take care of yourself

## 2023-06-05 NOTE — Assessment & Plan Note (Signed)
After a diarrheal illness  ER visit 7/12 Reviewed hospital records, lab results and studies in detail   Had some post illness fatigue but much better now  Na level is back to normal at 138 along with other lytes Discussed using care with hydration  For travel in high altitudes-use diamox only if needed and add lyte solution to water / eat salty foods along with fluids  Instructed to call if diarrhea or other symptoms return  Call back and Er precautions noted in detail today

## 2023-06-05 NOTE — Progress Notes (Signed)
Subjective:    Patient ID: Karen Rice, female    DOB: 1951/01/22, 72 y.o.   MRN: [id number]  HPI  Wt Readings from Last 3 Encounters:  06/05/23 108 lb 8 oz (49.2 kg)  05/22/23 108 lb (49 kg)  03/11/23 113 lb (51.3 kg)   18.92 kg/m  Vitals:   06/05/23 1049  BP: 122/70  Pulse: 88  Temp: 97.7 F (36.5 C)  SpO2: 99%    Pt presents for follow up of GI virus with hyponatremia  Diarrhea for a week  Drank a lot of water   Seen in ER on 7/12 Diarrheal illness/dehydrated  Sodium 128 GFR over 60 Normal hepatic fxn and mag  Given IVF/ sodium chloride   She stayed very tired  Had to cancel a trip   Some difficulty getting na up initially using gatorade/ still had too much fluid , na came up to 129 on 7/18   Now labs are improved after cutting fluids/ eating salty foods and resolution of diarrhea Feels better  No longer tired  Lab Results  Component Value Date   NA 138 06/04/2023   K 4.0 06/04/2023   CO2 27 06/04/2023   GLUCOSE 101 (H) 06/04/2023   BUN 14 06/04/2023   CREATININE 0.87 06/04/2023   CALCIUM 10.0 06/04/2023   GFR 66.83 06/04/2023   GFRNONAA >60 05/22/2023    Travels to high altitude  Takes 1/2 of 125 mg diamox pill      Patient Active Problem List   Diagnosis Date Noted   Hyponatremia 05/25/2023   Laceration of finger 02/23/2023   Altitude sickness preventative measures 02/23/2023   Anal fissure 11/25/2022   Family history of sudden cardiac death 06-Mar-2021   Colon cancer screening 12/02/2018   Genetic testing 08/26/2018   Family history of breast cancer    Family history of uterine cancer    Hypertension 03/03/2011   Routine general medical examination at a health care facility 03/03/2011   Allergic rhinitis 07/21/2007   Age-related osteoporosis without fracture 07/21/2007   Past Medical History:  Diagnosis Date   Allergy    allergic rhinitis   Family history of breast cancer    Family history of uterine cancer    Gilbert's syndrome     with high bilirubin   Hypertension    Osteoporosis    Past Surgical History:  Procedure Laterality Date   TONSILLECTOMY AND ADENOIDECTOMY  11/11/1955   Social History   Tobacco Use   Smoking status: Former    Current packs/day: 0.00    Types: Cigarettes    Quit date: 11/10/2000    Years since quitting: 22.5   Smokeless tobacco: Never  Vaping Use   Vaping status: Never Used  Substance Use Topics   Alcohol use: Yes    Alcohol/week: 5.0 standard drinks of alcohol    Types: 5 Glasses of wine per week    Comment: Wine   Drug use: No   Family History  Problem Relation Age of Onset   Heart disease Mother        heart attack   Hypertension Mother    Breast cancer Mother        ages 59 and 19   Cancer Mother    Hypertension Father    Alcohol abuse Father    Heart disease Maternal Grandfather        MI   Hypertension Sister    Osteopenia Sister    Lung cancer Paternal Uncle  heavy smoker   Heart disease Maternal Aunt    Dementia Paternal Grandmother    Uterine cancer Cousin        dx in her 64s   Uterine cancer Other        MGM's maternal aunt   Allergies  Allergen Reactions   Boniva [Ibandronic Acid] Nausea Only   Lisinopril Other (See Comments)    Severe fatigue   Norvasc [Amlodipine Besylate]     Fatigue/ depression   Current Outpatient Medications on File Prior to Visit  Medication Sig Dispense Refill   acetaZOLAMIDE (DIAMOX) 125 MG tablet Take 1 pill by mouth twice daily for 1 day before climb through the duration of climb 30 tablet 0   Ascorbic Acid (VITAMIN C) 1000 MG tablet Take 1,000 mg by mouth daily.     fluticasone (FLONASE) 50 MCG/ACT nasal spray Place 2 sprays into both nostrils daily. 16 g 11   magnesium (MAGTAB) 84 MG ( ) TBCR SR tablet Take 84 mg by mouth daily.     miconazole (MICATIN) 2 % cream Apply 1 Application topically 2 (two) times daily. 28.35 g 1   Multiple Vitamin (MULTIVITAMIN) capsule Take 1 capsule by mouth daily.      olmesartan (BENICAR) 20 MG tablet Take 1 tablet (20 mg total) by mouth daily. (Patient taking differently: Take 10 mg by mouth daily.) 90 tablet 3   tretinoin (RETIN-A) 0.05 % cream Uses seasonally     triamcinolone cream (KENALOG) 0.1 % Apply 1 Application topically 2 (two) times daily. Can use for up to 7 days. 30 g 0   vitamin B-12 (CYANOCOBALAMIN) 500 MCG tablet Take 500 mcg by mouth daily.     VITAMIN D PO Take 3,000 Units by mouth.     No current facility-administered medications on file prior to visit.    Review of Systems  Constitutional:  Negative for activity change, appetite change, fatigue, fever and unexpected weight change.       Fatigue is resolved   HENT:  Negative for congestion, ear pain, rhinorrhea, sinus pressure and sore throat.   Eyes:  Negative for pain, redness and visual disturbance.  Respiratory:  Negative for cough, shortness of breath and wheezing.   Cardiovascular:  Negative for chest pain and palpitations.  Gastrointestinal:  Negative for abdominal pain, blood in stool, constipation and diarrhea.       Diarrhea is resolved   Endocrine: Negative for polydipsia and polyuria.  Genitourinary:  Negative for dysuria, frequency and urgency.  Musculoskeletal:  Negative for arthralgias, back pain and myalgias.  Skin:  Negative for pallor and rash.  Allergic/Immunologic: Negative for environmental allergies.  Neurological:  Negative for dizziness, syncope and headaches.  Hematological:  Negative for adenopathy. Does not bruise/bleed easily.  Psychiatric/Behavioral:  Negative for decreased concentration and dysphoric mood. The patient is not nervous/anxious.        Objective:   Physical Exam Constitutional:      General: She is not in acute distress.    Appearance: Normal appearance. She is well-developed and normal weight. She is not ill-appearing or diaphoretic.  HENT:     Head: Normocephalic and atraumatic.  Eyes:     General: No scleral icterus.        Right eye: No discharge.        Left eye: No discharge.     Conjunctiva/sclera: Conjunctivae normal.     Pupils: Pupils are equal, round, and reactive to light.  Neck:     Thyroid: No thyromegaly.  Vascular: No carotid bruit or JVD.  Cardiovascular:     Rate and Rhythm: Normal rate and regular rhythm.     Heart sounds: Normal heart sounds.     No gallop.  Pulmonary:     Effort: Pulmonary effort is normal. No respiratory distress.     Breath sounds: Normal breath sounds. No wheezing or rales.  Abdominal:     General: Abdomen is flat. There is no distension or abdominal bruit.     Palpations: Abdomen is soft. There is no hepatomegaly, splenomegaly, mass or pulsatile mass.     Tenderness: There is no abdominal tenderness. There is no guarding or rebound. Negative signs include Murphy's sign and McBurney's sign.  Musculoskeletal:     Cervical back: Normal range of motion and neck supple.     Right lower leg: No edema.     Left lower leg: No edema.  Lymphadenopathy:     Cervical: No cervical adenopathy.  Skin:    General: Skin is warm and dry.     Coloration: Skin is not pale.     Findings: No rash.  Neurological:     Mental Status: She is alert.     Coordination: Coordination normal.     Deep Tendon Reflexes: Reflexes are normal and symmetric. Reflexes normal.  Psychiatric:        Mood and Affect: Mood normal.           Assessment & Plan:   Problem List Items Addressed This Visit       Other   Hyponatremia - Primary    After a diarrheal illness  ER visit 7/12 Reviewed hospital records, lab results and studies in detail   Had some post illness fatigue but much better now  Na level is back to normal at 138 along with other lytes Discussed using care with hydration  For travel in high altitudes-use diamox only if needed and add lyte solution to water / eat salty foods along with fluids  Instructed to call if diarrhea or other symptoms return  Call back and Er  precautions noted in detail today        Altitude sickness preventative measures    Instructed to use caution with diamox due to recent hyponatremia (was most likely due to diarrhea)

## 2023-06-10 ENCOUNTER — Encounter: Payer: Self-pay | Admitting: Family Medicine

## 2023-07-20 ENCOUNTER — Encounter: Payer: Self-pay | Admitting: Family Medicine

## 2023-07-21 DIAGNOSIS — Z23 Encounter for immunization: Secondary | ICD-10-CM | POA: Diagnosis not present

## 2023-09-01 ENCOUNTER — Ambulatory Visit (INDEPENDENT_AMBULATORY_CARE_PROVIDER_SITE_OTHER): Payer: Medicare Other

## 2023-09-01 DIAGNOSIS — Z23 Encounter for immunization: Secondary | ICD-10-CM | POA: Diagnosis not present

## 2023-09-08 DIAGNOSIS — D2271 Melanocytic nevi of right lower limb, including hip: Secondary | ICD-10-CM | POA: Diagnosis not present

## 2023-09-08 DIAGNOSIS — L814 Other melanin hyperpigmentation: Secondary | ICD-10-CM | POA: Diagnosis not present

## 2023-09-08 DIAGNOSIS — L821 Other seborrheic keratosis: Secondary | ICD-10-CM | POA: Diagnosis not present

## 2023-09-18 ENCOUNTER — Encounter: Payer: Self-pay | Admitting: Family Medicine

## 2023-09-18 DIAGNOSIS — Z1231 Encounter for screening mammogram for malignant neoplasm of breast: Secondary | ICD-10-CM

## 2023-09-20 DIAGNOSIS — Z1231 Encounter for screening mammogram for malignant neoplasm of breast: Secondary | ICD-10-CM | POA: Insufficient documentation

## 2023-09-20 NOTE — Telephone Encounter (Signed)
Please release external ref for mammogram to fax at that # to [place]  Thanks

## 2023-09-22 ENCOUNTER — Other Ambulatory Visit: Payer: Self-pay

## 2023-09-22 DIAGNOSIS — Z1231 Encounter for screening mammogram for malignant neoplasm of breast: Secondary | ICD-10-CM

## 2023-09-22 NOTE — Telephone Encounter (Signed)
Order faxed to number as requested.

## 2023-11-02 DIAGNOSIS — Z1231 Encounter for screening mammogram for malignant neoplasm of breast: Secondary | ICD-10-CM | POA: Diagnosis not present

## 2023-11-02 DIAGNOSIS — Z803 Family history of malignant neoplasm of breast: Secondary | ICD-10-CM | POA: Diagnosis not present

## 2023-11-02 LAB — HM MAMMOGRAPHY

## 2023-12-07 ENCOUNTER — Ambulatory Visit (HOSPITAL_BASED_OUTPATIENT_CLINIC_OR_DEPARTMENT_OTHER): Payer: Self-pay | Admitting: Obstetrics & Gynecology

## 2023-12-17 ENCOUNTER — Encounter: Payer: Self-pay | Admitting: Family Medicine

## 2024-02-01 ENCOUNTER — Telehealth: Payer: Self-pay | Admitting: *Deleted

## 2024-02-01 DIAGNOSIS — M818 Other osteoporosis without current pathological fracture: Secondary | ICD-10-CM

## 2024-02-01 DIAGNOSIS — I10 Essential (primary) hypertension: Secondary | ICD-10-CM

## 2024-02-01 NOTE — Telephone Encounter (Signed)
 Dr. Milinda Antis please put lab orders in they will need to be in chart before pt can go to [facility]  CPE is on 02/19/24

## 2024-02-01 NOTE — Telephone Encounter (Signed)
 Copied from CRM [id number]. Topic: Clinical - Request for Lab/Test Order >> Feb 01, 2024 12:25 PM Gurney Maxin H wrote: Reason for CRM: Patient called in to ask if she can have her labs done at [facility] states she's had labs done at that location and wants Dr. Milinda Antis to send orders there, patient has an appointment for labs on 4/09 at the [facility] location. Please reach out to patient, thanks.  Dewayne Hatch [phone]

## 2024-02-01 NOTE — Telephone Encounter (Signed)
The orders are in.

## 2024-02-02 ENCOUNTER — Telehealth: Payer: Self-pay

## 2024-02-02 NOTE — Telephone Encounter (Signed)
 Pt notified she will call [facility] and get a lab appt scheduled

## 2024-02-02 NOTE — Telephone Encounter (Signed)
 Copied from CRM [id number]. Topic: General - Other >> Feb 02, 2024  1:04 PM Rodman Pickle T wrote: Reason for CRM: patient is wanting her labs done at [facility] Im unable to pull [facility] lab scheduled up in epic patient would like a call back  FYI: Patient wanting to schedule lab only appt here in office. Returned pt call and scheduled fasting lab appt. Future lab orders already placed

## 2024-02-02 NOTE — Telephone Encounter (Signed)
 Please reach out to her , thanks   Orders are in

## 2024-02-03 NOTE — Telephone Encounter (Signed)
 Pt scheduled labs with [facility] on yesterday for 02/17/24

## 2024-02-17 ENCOUNTER — Other Ambulatory Visit: Payer: Medicare Other

## 2024-02-17 ENCOUNTER — Other Ambulatory Visit (INDEPENDENT_AMBULATORY_CARE_PROVIDER_SITE_OTHER)

## 2024-02-17 DIAGNOSIS — H2513 Age-related nuclear cataract, bilateral: Secondary | ICD-10-CM | POA: Diagnosis not present

## 2024-02-17 DIAGNOSIS — I10 Essential (primary) hypertension: Secondary | ICD-10-CM | POA: Diagnosis not present

## 2024-02-17 DIAGNOSIS — H25013 Cortical age-related cataract, bilateral: Secondary | ICD-10-CM | POA: Diagnosis not present

## 2024-02-17 DIAGNOSIS — H04123 Dry eye syndrome of bilateral lacrimal glands: Secondary | ICD-10-CM | POA: Diagnosis not present

## 2024-02-17 DIAGNOSIS — M818 Other osteoporosis without current pathological fracture: Secondary | ICD-10-CM

## 2024-02-17 DIAGNOSIS — H52203 Unspecified astigmatism, bilateral: Secondary | ICD-10-CM | POA: Diagnosis not present

## 2024-02-17 LAB — COMPREHENSIVE METABOLIC PANEL WITH GFR
ALT: 17 U/L (ref 0–35)
AST: 25 U/L (ref 0–37)
Albumin: 4.6 g/dL (ref 3.5–5.2)
Alkaline Phosphatase: 69 U/L (ref 39–117)
BUN: 15 mg/dL (ref 6–23)
CO2: 26 meq/L (ref 19–32)
Calcium: 9.6 mg/dL (ref 8.4–10.5)
Chloride: 99 meq/L (ref 96–112)
Creatinine, Ser: 0.66 mg/dL (ref 0.40–1.20)
GFR: 87.56 mL/min (ref >60.00)
Glucose, Bld: 98 mg/dL (ref 70–99)
Potassium: 3.8 meq/L (ref 3.5–5.1)
Sodium: 133 meq/L — ABNORMAL LOW (ref 135–145)
Total Bilirubin: 2 mg/dL — ABNORMAL HIGH (ref 0.2–1.2)
Total Protein: 7.1 g/dL (ref 6.0–8.3)

## 2024-02-17 LAB — LIPID PANEL
Cholesterol: 167 mg/dL (ref 0–200)
HDL: 87.6 mg/dL (ref >39.00)
LDL Cholesterol: 69 mg/dL (ref 0–99)
NonHDL: 79.64
Total CHOL/HDL Ratio: 2
Triglycerides: 54 mg/dL (ref 0.0–149.0)
VLDL: 10.8 mg/dL (ref 0.0–40.0)

## 2024-02-17 LAB — VITAMIN D 25 HYDROXY (VIT D DEFICIENCY, FRACTURES): VITD: 66.54 ng/mL (ref 30.00–100.00)

## 2024-02-17 LAB — CBC WITH DIFFERENTIAL/PLATELET
Basophils Absolute: 0.1 10*3/uL (ref 0.0–0.1)
Basophils Relative: 1 % (ref 0.0–3.0)
Eosinophils Absolute: 0.1 10*3/uL (ref 0.0–0.7)
Eosinophils Relative: 1.7 % (ref 0.0–5.0)
HCT: 42.8 % (ref 36.0–46.0)
Hemoglobin: 14.6 g/dL (ref 12.0–15.0)
Lymphocytes Relative: 30 % (ref 12.0–46.0)
Lymphs Abs: 1.9 10*3/uL (ref 0.7–4.0)
MCHC: 34.1 g/dL (ref 30.0–36.0)
MCV: 93.7 fl (ref 78.0–100.0)
Monocytes Absolute: 0.6 10*3/uL (ref 0.1–1.0)
Monocytes Relative: 9.5 % (ref 3.0–12.0)
Neutro Abs: 3.6 10*3/uL (ref 1.4–7.7)
Neutrophils Relative %: 57.8 % (ref 43.0–77.0)
Platelets: 287 10*3/uL (ref 150.0–400.0)
RBC: 4.57 Mil/uL (ref 3.87–5.11)
RDW: 13.8 % (ref 11.5–15.5)
WBC: 6.2 10*3/uL (ref 4.0–10.5)

## 2024-02-17 LAB — TSH: TSH: 3.14 u[IU]/mL (ref 0.35–5.50)

## 2024-02-18 ENCOUNTER — Ambulatory Visit (HOSPITAL_BASED_OUTPATIENT_CLINIC_OR_DEPARTMENT_OTHER): Payer: Medicare Other | Admitting: Certified Nurse Midwife

## 2024-02-19 ENCOUNTER — Ambulatory Visit (INDEPENDENT_AMBULATORY_CARE_PROVIDER_SITE_OTHER): Payer: Medicare Other | Admitting: Family Medicine

## 2024-02-19 ENCOUNTER — Encounter: Payer: Self-pay | Admitting: Family Medicine

## 2024-02-19 VITALS — BP 136/82 | HR 72 | Temp 98.1°F | Ht 63.0 in | Wt 106.4 lb

## 2024-02-19 DIAGNOSIS — Z1211 Encounter for screening for malignant neoplasm of colon: Secondary | ICD-10-CM

## 2024-02-19 DIAGNOSIS — M818 Other osteoporosis without current pathological fracture: Secondary | ICD-10-CM

## 2024-02-19 DIAGNOSIS — E871 Hypo-osmolality and hyponatremia: Secondary | ICD-10-CM | POA: Diagnosis not present

## 2024-02-19 DIAGNOSIS — J301 Allergic rhinitis due to pollen: Secondary | ICD-10-CM | POA: Diagnosis not present

## 2024-02-19 DIAGNOSIS — I10 Essential (primary) hypertension: Secondary | ICD-10-CM | POA: Diagnosis not present

## 2024-02-19 NOTE — Progress Notes (Signed)
 Subjective:    Patient ID: Karen Rice, female    DOB: 06/17/1951, 73 y.o.   MRN: [id number]  HPI  Here for annual follow up of chronic medical problems    Wt Readings from Last 3 Encounters:  02/19/24 106 lb 6 oz (48.3 kg)  06/05/23 108 lb 8 oz (49.2 kg)  05/22/23 108 lb (49 kg)   18.84 kg/m  Vitals:   02/19/24 0835  BP: 136/82  Pulse: 72  Temp: 98.1 F (36.7 C)  SpO2: 97%    Immunization History  Administered Date(s) Administered   Fluad Quad(high Dose 65+) 08/16/2020   Fluad Trivalent(High Dose 65+) 09/01/2023   Hep A / Hep B 12/06/2004, 01/10/2005, 06/13/2005   Influenza Split 10/20/2011, 07/26/2012   Influenza Whole 08/14/2010   Influenza, High Dose Seasonal PF 08/26/2017, 09/07/2018, 08/20/2021   Influenza,inj,Quad PF,6+ Mos 08/31/2013, 09/08/2014, 09/18/2015, 10/06/2016, 07/26/2019, 08/20/2021, 08/27/2022   Moderna Sars-Covid-2 Vaccination 01/02/2020, 02/14/2020, 09/29/2020   Pfizer(Comirnaty)Fall Seasonal Vaccine 12 years and older 10/07/2022   Pneumococcal Conjugate-13 11/12/2016   Pneumococcal Polysaccharide-23 09/15/2019   Td 11/11/1999, 07/18/2010, 02/23/2023   Tdap 09/15/2013   Zoster, Live 03/23/2013    Health Maintenance Due  Topic Date Due   Zoster Vaccines- Shingrix (1 of 2) 07/29/2001   Medicare Annual Wellness (AWV)  02/23/2024     Living in [place] with her partner most of the time now  Not planning to move permanently  Doing very well  Has adjusted to the altitude some but still gets light headed at times   Has baseline low sodium but she does drink a lot of water   Shingrix vaccine  Plans to get it   Mammogram 10/2023  Self breast exam-no lumps   Gyn health Gyn appointment at end of year  Wants to do breast exam today    Colon cancer screening  Colonoscopy 07/2019 with 10 y recall   Bone health  Dexa 01/2022 osteoporosis at the breast center  Falls-none  Fractures-none  Supplements  Last vitamin D Lab Results  Component  Value Date   VD25OH 66.54 02/17/2024    Exercise  Lots , hiking/ lifting     Mood    02/19/2024    8:41 AM 02/10/2023    4:01 PM 11/21/2022   10:20 AM 02/03/2022    9:07 AM 11/15/2021    8:53 AM  Depression screen PHQ 2/9  Decreased Interest 0 0 0 0 0  Down, Depressed, Hopeless 0 0 0 0 1  PHQ - 2 Score 0 0 0 0 1  Altered sleeping 0 0     Tired, decreased energy 0 0     Change in appetite 0 0     Feeling bad or failure about yourself  0 0     Trouble concentrating 0 0     Moving slowly or fidgety/restless 0 0     Suicidal thoughts 0 0     PHQ-9 Score 0 0     Difficult doing work/chores Not difficult at all Not difficult at all      Some anxiety about politics     HTN bp is stable today  No cp or palpitations or headaches or edema  No side effects to medicines  BP Readings from Last 3 Encounters:  02/19/24 136/82  06/05/23 122/70  05/22/23 (!) 148/84    Benicar 10 mg daily   125/77 at home yesterday    Lab Results  Component Value Date   NA 133 (  L) 02/17/2024   K 3.8 02/17/2024   CO2 26 02/17/2024   GLUCOSE 98 02/17/2024   BUN 15 02/17/2024   CREATININE 0.66 02/17/2024   CALCIUM 9.6 02/17/2024   GFR 87.56 02/17/2024   GFRNONAA >60 05/22/2023   Lab Results  Component Value Date   ALT 17 02/17/2024   AST 25 02/17/2024   ALKPHOS 69 02/17/2024   BILITOT 2.0 (H) 02/17/2024   Lab Results  Component Value Date   TSH 3.14 02/17/2024   Lab Results  Component Value Date   WBC 6.2 02/17/2024   HGB 14.6 02/17/2024   HCT 42.8 02/17/2024   MCV 93.7 02/17/2024   PLT 287.0 02/17/2024    Cholesterol Lab Results  Component Value Date   CHOL 167 02/17/2024   CHOL 192 02/16/2023   CHOL 180 02/07/2022   Lab Results  Component Value Date   HDL 87.60 02/17/2024   HDL 90.90 02/16/2023   HDL 90.20 02/07/2022   Lab Results  Component Value Date   LDLCALC 69 02/17/2024   LDLCALC 90 02/16/2023   LDLCALC 80 02/07/2022   Lab Results  Component Value Date    TRIG 54.0 02/17/2024   TRIG 58.0 02/16/2023   TRIG 51.0 02/07/2022   Lab Results  Component Value Date   CHOLHDL 2 02/17/2024   CHOLHDL 2 02/16/2023   CHOLHDL 2 02/07/2022   Lab Results  Component Value Date   LDLDIRECT 94.6 07/19/2012   LDLDIRECT 89.7 12/26/2008   Eating well -less red meat  LDL is down     Patient Active Problem List   Diagnosis Date Noted   Encounter for screening mammogram for breast cancer 09/20/2023   Hyponatremia 05/25/2023   Altitude sickness preventative measures 02/23/2023   Anal fissure 11/25/2022   Family history of sudden cardiac death 02/20/21   Colon cancer screening 12/02/2018   Genetic testing 08/26/2018   Family history of breast cancer    Family history of uterine cancer    Hypertension 03/03/2011   Routine general medical examination at a health care facility 03/03/2011   Allergic rhinitis 07/21/2007   Age-related osteoporosis without fracture 07/21/2007   Past Medical History:  Diagnosis Date   Allergy    allergic rhinitis   Family history of breast cancer    Family history of uterine cancer    Gilbert's syndrome    with high bilirubin   Hypertension    Osteoporosis    Past Surgical History:  Procedure Laterality Date   TONSILLECTOMY AND ADENOIDECTOMY  11/11/1955   Social History   Tobacco Use   Smoking status: Former    Current packs/day: 0.00    Types: Cigarettes    Quit date: 11/10/2000    Years since quitting: 23.2   Smokeless tobacco: Never  Vaping Use   Vaping status: Never Used  Substance Use Topics   Alcohol use: Yes    Alcohol/week: 5.0 standard drinks of alcohol    Types: 5 Glasses of wine per week    Comment: Wine   Drug use: No   Family History  Problem Relation Age of Onset   Heart disease Mother        heart attack   Hypertension Mother    Breast cancer Mother        ages 61 and 20   Cancer Mother    Hypertension Father    Alcohol abuse Father    Heart disease Maternal Grandfather         MI  Hypertension Sister    Osteopenia Sister    Lung cancer Paternal Uncle        heavy smoker   Heart disease Maternal Aunt    Dementia Paternal Grandmother    Uterine cancer Cousin        dx in her 7s   Uterine cancer Other        MGM's maternal aunt   Allergies  Allergen Reactions   Boniva [Ibandronate] Nausea Only   Lisinopril Other (See Comments)    Severe fatigue   Norvasc [Amlodipine Besylate]     Fatigue/ depression   Current Outpatient Medications on File Prior to Visit  Medication Sig Dispense Refill   Ascorbic Acid (VITAMIN C) 1000 MG tablet Take 1,000 mg by mouth daily.     fluticasone (FLONASE) 50 MCG/ACT nasal spray Place 2 sprays into both nostrils daily. (Patient taking differently: Place 2 sprays into both nostrils daily as needed.) 16 g 11   Magnesium 125 MG CAPS Take 1 capsule by mouth daily.     Multiple Vitamin (MULTIVITAMIN) capsule Take 1 capsule by mouth daily.     olmesartan (BENICAR) 20 MG tablet Take 1 tablet (20 mg total) by mouth daily. (Patient taking differently: Take 10 mg by mouth daily.) 90 tablet 3   tretinoin (RETIN-A) 0.05 % cream Apply topically daily as needed. Uses seasonally     triamcinolone cream (KENALOG) 0.1 % Apply 1 Application topically 2 (two) times daily. Can use for up to 7 days. (Patient taking differently: Apply 1 Application topically daily as needed.) 30 g 0   vitamin B-12 (CYANOCOBALAMIN) 500 MCG tablet Take 500 mcg by mouth daily.     VITAMIN D PO Take 3,000 Units by mouth.     No current facility-administered medications on file prior to visit.    Review of Systems  Constitutional:  Negative for activity change, appetite change, fatigue, fever and unexpected weight change.  HENT:  Negative for congestion, ear pain, rhinorrhea, sinus pressure and sore throat.   Eyes:  Negative for pain, redness and visual disturbance.  Respiratory:  Negative for cough, shortness of breath and wheezing.        Does occationally get  more shortness of breath with exercise at higher altitudes   Cardiovascular:  Negative for chest pain and palpitations.  Gastrointestinal:  Negative for abdominal pain, blood in stool, constipation and diarrhea.  Endocrine: Negative for polydipsia and polyuria.  Genitourinary:  Negative for dysuria, frequency and urgency.  Musculoskeletal:  Negative for arthralgias, back pain and myalgias.  Skin:  Negative for pallor and rash.  Allergic/Immunologic: Negative for environmental allergies.  Neurological:  Negative for dizziness, syncope and headaches.  Hematological:  Negative for adenopathy. Does not bruise/bleed easily.  Psychiatric/Behavioral:  Negative for decreased concentration and dysphoric mood. The patient is not nervous/anxious.        Objective:   Physical Exam Constitutional:      General: She is not in acute distress.    Appearance: Normal appearance. She is well-developed and normal weight. She is not ill-appearing or diaphoretic.  HENT:     Head: Normocephalic and atraumatic.     Right Ear: Tympanic membrane, ear canal and external ear normal.     Left Ear: Tympanic membrane, ear canal and external ear normal.     Nose: Nose normal. No congestion.     Mouth/Throat:     Mouth: Mucous membranes are moist.     Pharynx: Oropharynx is clear. No posterior oropharyngeal erythema.  Eyes:     General: No scleral icterus.    Extraocular Movements: Extraocular movements intact.     Conjunctiva/sclera: Conjunctivae normal.     Pupils: Pupils are equal, round, and reactive to light.  Neck:     Thyroid: No thyromegaly.     Vascular: No carotid bruit or JVD.  Cardiovascular:     Rate and Rhythm: Normal rate and regular rhythm.     Pulses: Normal pulses.     Heart sounds: Normal heart sounds.     No gallop.  Pulmonary:     Effort: Pulmonary effort is normal. No respiratory distress.     Breath sounds: Normal breath sounds. No wheezing.     Comments: Good air exch Chest:      Chest wall: No tenderness.  Abdominal:     General: Bowel sounds are normal. There is no distension or abdominal bruit.     Palpations: Abdomen is soft. There is no mass.     Tenderness: There is no abdominal tenderness.     Hernia: No hernia is present.  Genitourinary:    Comments: Breast exam: No mass, nodules, thickening, tenderness, bulging, retraction, inflamation, nipple discharge or skin changes noted.  No axillary or clavicular LA.     Musculoskeletal:        General: No tenderness. Normal range of motion.     Cervical back: Normal range of motion and neck supple. No rigidity. No muscular tenderness.     Right lower leg: No edema.     Left lower leg: No edema.     Comments: No kyphosis   Lymphadenopathy:     Cervical: No cervical adenopathy.  Skin:    General: Skin is warm and dry.     Coloration: Skin is not pale.     Findings: No erythema or rash.     Comments: Solar lentigines diffusely Some scattered sks   Neurological:     Mental Status: She is alert. Mental status is at baseline.     Cranial Nerves: No cranial nerve deficit.     Motor: No abnormal muscle tone.     Coordination: Coordination normal.     Gait: Gait normal.     Deep Tendon Reflexes: Reflexes are normal and symmetric. Reflexes normal.  Psychiatric:        Mood and Affect: Mood normal.        Cognition and Memory: Cognition and memory normal.           Assessment & Plan:   Problem List Items Addressed This Visit       Cardiovascular and Mediastinum   Hypertension   bp in fair control at this time  BP Readings from Last 1 Encounters:  02/19/24 136/82   No changes needed Most recent labs reviewed  Disc lifstyle change with low sodium diet and exercise  Plan to continue benicar 10 mg daily        Respiratory   Allergic rhinitis - Primary   Continues flonase in season  Living most time in differetn climate Fair control        Musculoskeletal and Integument   Age-related  osteoporosis without fracture   2 year dexa due/ordered /pt will plan to get this summer when back in town  No falls or fractures Discussed fall prevention, supplements and exercise for bone density   Great exercise  D level in normal range       Relevant Orders   DG Bone Density  Other   Hyponatremia   Sodium level of 133  Pt is careful with fluid intake / some electrolyte drinks after exercise  No symptoms Will continue to monitor       Colon cancer screening   Colonoscopy 07/2019 with 10 y recall

## 2024-02-19 NOTE — Patient Instructions (Addendum)
 Dry when you are thirsty or feel dry  And in heavy exercise  Electrolyte fluids are ok     If you are interested in the new shingles vaccine (Shingrix) - call your local pharmacy to check on coverage and availability   You have an order for:  []   2D Mammogram  []   3D Mammogram  [x]   Bone Density     Please call for appointment:   []   [facility]  [address] [place] [address]  [phone]  []   [facility]   [address]  [place], [place] [address]  [phone]  [x]   [facility]      [address] [place], [place]        [phone]         []   [facility]  [address] [place], [place]  [phone]  []  [facility]   [address]   [place], [place] [address]  [phone]  []  [facility]  [address] [place], [place] [address] [phone]    Make sure to wear two piece clothing  No lotions powders or deodorants the day of the appointment Make sure to bring picture ID and insurance card.  Bring list of medications you are currently taking including any supplements.   Schedule your screening mammogram through MyChart!   Select [facility] imaging sites can now be scheduled through MyChart.  Log into your MyChart account.  Go to 'Visit' (or 'Appointments' if  on mobile App) --> Schedule an  Appointment  Under 'Select a Reason for Visit' choose the Mammogram  Screening option.  Complete the pre-visit questions  and select the time and place that  best fits your schedule

## 2024-02-20 NOTE — Assessment & Plan Note (Signed)
 Sodium level of 133  Pt is careful with fluid intake / some electrolyte drinks after exercise  No symptoms Will continue to monitor

## 2024-02-20 NOTE — Assessment & Plan Note (Signed)
 Colonoscopy 07/2019 with 10 y recall

## 2024-02-20 NOTE — Assessment & Plan Note (Signed)
 bp in fair control at this time  BP Readings from Last 1 Encounters:  02/19/24 136/82   No changes needed Most recent labs reviewed  Disc lifstyle change with low sodium diet and exercise  Plan to continue benicar 10 mg daily

## 2024-02-20 NOTE — Assessment & Plan Note (Signed)
 2 year dexa due/ordered /pt will plan to get this summer when back in town  No falls or fractures Discussed fall prevention, supplements and exercise for bone density   Great exercise  D level in normal range

## 2024-02-20 NOTE — Assessment & Plan Note (Signed)
 Continues flonase in season  Living most time in differetn climate Fair control

## 2024-02-24 ENCOUNTER — Encounter: Payer: Medicare Other | Admitting: Family Medicine

## 2024-06-13 ENCOUNTER — Ambulatory Visit (HOSPITAL_BASED_OUTPATIENT_CLINIC_OR_DEPARTMENT_OTHER): Admitting: Obstetrics & Gynecology

## 2024-08-16 ENCOUNTER — Encounter: Payer: Self-pay | Admitting: Family Medicine

## 2024-08-18 MED ORDER — OLMESARTAN MEDOXOMIL 20 MG PO TABS: 20.0000 mg | ORAL_TABLET | Freq: Every day | ORAL | 0 refills | Status: AC

## 2024-08-23 DIAGNOSIS — M25562 Pain in left knee: Secondary | ICD-10-CM | POA: Diagnosis not present

## 2024-08-23 DIAGNOSIS — I10 Essential (primary) hypertension: Secondary | ICD-10-CM | POA: Diagnosis not present

## 2024-08-25 ENCOUNTER — Ambulatory Visit

## 2024-10-18 ENCOUNTER — Encounter: Payer: Self-pay | Admitting: Family Medicine

## 2024-10-18 MED ORDER — CEFUROXIME AXETIL 500 MG PO TABS: 500.0000 mg | ORAL_TABLET | Freq: Two times a day (BID) | ORAL | 0 refills | Status: AC

## 2024-10-20 DIAGNOSIS — L738 Other specified follicular disorders: Secondary | ICD-10-CM | POA: Diagnosis not present

## 2024-10-20 DIAGNOSIS — D225 Melanocytic nevi of trunk: Secondary | ICD-10-CM | POA: Diagnosis not present

## 2024-10-20 DIAGNOSIS — L814 Other melanin hyperpigmentation: Secondary | ICD-10-CM | POA: Diagnosis not present

## 2024-10-20 DIAGNOSIS — D1801 Hemangioma of skin and subcutaneous tissue: Secondary | ICD-10-CM | POA: Diagnosis not present

## 2024-10-20 DIAGNOSIS — L821 Other seborrheic keratosis: Secondary | ICD-10-CM | POA: Diagnosis not present

## 2024-10-20 DIAGNOSIS — L570 Actinic keratosis: Secondary | ICD-10-CM | POA: Diagnosis not present

## 2024-10-27 ENCOUNTER — Other Ambulatory Visit

## 2025-01-17 ENCOUNTER — Ambulatory Visit (HOSPITAL_BASED_OUTPATIENT_CLINIC_OR_DEPARTMENT_OTHER): Admitting: Obstetrics & Gynecology

## 2025-01-18 ENCOUNTER — Other Ambulatory Visit (HOSPITAL_BASED_OUTPATIENT_CLINIC_OR_DEPARTMENT_OTHER)

## 2025-02-20 ENCOUNTER — Other Ambulatory Visit

## 2025-02-24 ENCOUNTER — Encounter: Admitting: Family Medicine

## 2025-03-22 ENCOUNTER — Other Ambulatory Visit

## 2025-03-24 ENCOUNTER — Ambulatory Visit

## 2025-03-24 ENCOUNTER — Encounter: Admitting: Family Medicine
